# Patient Record
Sex: Male | Born: 1935 | Race: White | Hispanic: No | Smoking: Former smoker
Health system: Southern US, Community
[De-identification: ages and names within clinical notes are randomized; demographics above are authoritative.]

## PROBLEM LIST (undated history)

## (undated) DIAGNOSIS — I509 Heart failure, unspecified: Secondary | ICD-10-CM

## (undated) DIAGNOSIS — F1010 Alcohol abuse, uncomplicated: Secondary | ICD-10-CM

## (undated) DIAGNOSIS — F423 Hoarding disorder: Secondary | ICD-10-CM

## (undated) DIAGNOSIS — B029 Zoster without complications: Secondary | ICD-10-CM

## (undated) DIAGNOSIS — N400 Enlarged prostate without lower urinary tract symptoms: Secondary | ICD-10-CM

## (undated) DIAGNOSIS — I480 Paroxysmal atrial fibrillation: Secondary | ICD-10-CM

## (undated) DIAGNOSIS — F32A Depression, unspecified: Secondary | ICD-10-CM

## (undated) DIAGNOSIS — N200 Calculus of kidney: Secondary | ICD-10-CM

## (undated) DIAGNOSIS — K219 Gastro-esophageal reflux disease without esophagitis: Secondary | ICD-10-CM

## (undated) DIAGNOSIS — Z950 Presence of cardiac pacemaker: Secondary | ICD-10-CM

## (undated) DIAGNOSIS — K7460 Unspecified cirrhosis of liver: Secondary | ICD-10-CM

## (undated) DIAGNOSIS — E785 Hyperlipidemia, unspecified: Secondary | ICD-10-CM

## (undated) DIAGNOSIS — J449 Chronic obstructive pulmonary disease, unspecified: Secondary | ICD-10-CM

## (undated) DIAGNOSIS — R911 Solitary pulmonary nodule: Secondary | ICD-10-CM

## (undated) DIAGNOSIS — M069 Rheumatoid arthritis, unspecified: Secondary | ICD-10-CM

## (undated) DIAGNOSIS — K644 Residual hemorrhoidal skin tags: Secondary | ICD-10-CM

## (undated) DIAGNOSIS — K449 Diaphragmatic hernia without obstruction or gangrene: Secondary | ICD-10-CM

## (undated) DIAGNOSIS — F419 Anxiety disorder, unspecified: Secondary | ICD-10-CM

## (undated) DIAGNOSIS — I2510 Atherosclerotic heart disease of native coronary artery without angina pectoris: Secondary | ICD-10-CM

## (undated) DIAGNOSIS — K635 Polyp of colon: Secondary | ICD-10-CM

## (undated) DIAGNOSIS — I10 Essential (primary) hypertension: Secondary | ICD-10-CM

## (undated) DIAGNOSIS — I442 Atrioventricular block, complete: Secondary | ICD-10-CM

## (undated) DIAGNOSIS — K5730 Diverticulosis of large intestine without perforation or abscess without bleeding: Secondary | ICD-10-CM

## (undated) DIAGNOSIS — F329 Major depressive disorder, single episode, unspecified: Secondary | ICD-10-CM

## (undated) DIAGNOSIS — C44309 Unspecified malignant neoplasm of skin of other parts of face: Secondary | ICD-10-CM

## (undated) DIAGNOSIS — K222 Esophageal obstruction: Secondary | ICD-10-CM

## (undated) HISTORY — DX: Depression, unspecified: F32.A

## (undated) HISTORY — PX: TOTAL KNEE ARTHROPLASTY: SHX125

## (undated) HISTORY — DX: Anxiety disorder, unspecified: F41.9

## (undated) HISTORY — DX: Gastro-esophageal reflux disease without esophagitis: K21.9

## (undated) HISTORY — DX: Unspecified malignant neoplasm of skin of other parts of face: C44.309

## (undated) HISTORY — DX: Solitary pulmonary nodule: R91.1

## (undated) HISTORY — DX: Chronic obstructive pulmonary disease, unspecified: J44.9

## (undated) HISTORY — DX: Hyperlipidemia, unspecified: E78.5

## (undated) HISTORY — PX: INGUINAL HERNIA REPAIR: SUR1180

## (undated) HISTORY — PX: TOTAL HIP ARTHROPLASTY: SHX124

## (undated) HISTORY — DX: Diaphragmatic hernia without obstruction or gangrene: K44.9

## (undated) HISTORY — DX: Paroxysmal atrial fibrillation: I48.0

## (undated) HISTORY — DX: Calculus of kidney: N20.0

## (undated) HISTORY — PX: SPLENECTOMY: SUR1306

## (undated) HISTORY — DX: Zoster without complications: B02.9

## (undated) HISTORY — DX: Benign prostatic hyperplasia without lower urinary tract symptoms: N40.0

## (undated) HISTORY — DX: Polyp of colon: K63.5

## (undated) HISTORY — PX: APPENDECTOMY: SHX54

## (undated) HISTORY — DX: Alcohol abuse, uncomplicated: F10.10

## (undated) HISTORY — DX: Atrioventricular block, complete: I44.2

## (undated) HISTORY — DX: Atherosclerotic heart disease of native coronary artery without angina pectoris: I25.10

## (undated) HISTORY — DX: Esophageal obstruction: K22.2

## (undated) HISTORY — PX: SKIN CANCER EXCISION: SHX779

## (undated) HISTORY — DX: Unspecified cirrhosis of liver: K74.60

## (undated) HISTORY — DX: Diverticulosis of large intestine without perforation or abscess without bleeding: K57.30

## (undated) HISTORY — DX: Major depressive disorder, single episode, unspecified: F32.9

## (undated) HISTORY — DX: Residual hemorrhoidal skin tags: K64.4

## (undated) HISTORY — DX: Essential (primary) hypertension: I10

## (undated) HISTORY — DX: Rheumatoid arthritis, unspecified: M06.9

---

## 2000-01-10 ENCOUNTER — Encounter: Payer: Self-pay | Admitting: Emergency Medicine

## 2000-01-10 ENCOUNTER — Observation Stay (HOSPITAL_COMMUNITY): Admission: EM | Admit: 2000-01-10 | Discharge: 2000-01-11 | Payer: Self-pay

## 2000-01-10 ENCOUNTER — Encounter: Payer: Self-pay | Admitting: Specialist

## 2000-05-30 ENCOUNTER — Encounter (INDEPENDENT_AMBULATORY_CARE_PROVIDER_SITE_OTHER): Payer: Self-pay | Admitting: Gastroenterology

## 2001-01-05 ENCOUNTER — Ambulatory Visit (HOSPITAL_COMMUNITY): Admission: RE | Admit: 2001-01-05 | Discharge: 2001-01-05 | Payer: Self-pay | Admitting: Internal Medicine

## 2002-04-20 ENCOUNTER — Encounter: Admission: RE | Admit: 2002-04-20 | Discharge: 2002-04-20 | Payer: Self-pay | Admitting: Internal Medicine

## 2002-04-20 ENCOUNTER — Encounter: Payer: Self-pay | Admitting: Internal Medicine

## 2004-12-19 ENCOUNTER — Ambulatory Visit (HOSPITAL_COMMUNITY): Admission: RE | Admit: 2004-12-19 | Discharge: 2004-12-19 | Payer: Self-pay | Admitting: Internal Medicine

## 2004-12-20 ENCOUNTER — Ambulatory Visit (HOSPITAL_COMMUNITY): Admission: RE | Admit: 2004-12-20 | Discharge: 2004-12-20 | Payer: Self-pay | Admitting: Internal Medicine

## 2004-12-26 ENCOUNTER — Encounter: Admission: RE | Admit: 2004-12-26 | Discharge: 2004-12-26 | Payer: Self-pay | Admitting: Internal Medicine

## 2006-01-27 ENCOUNTER — Ambulatory Visit: Payer: Self-pay | Admitting: Gastroenterology

## 2006-02-04 ENCOUNTER — Ambulatory Visit: Payer: Self-pay | Admitting: Gastroenterology

## 2006-02-04 ENCOUNTER — Encounter (INDEPENDENT_AMBULATORY_CARE_PROVIDER_SITE_OTHER): Payer: Self-pay | Admitting: *Deleted

## 2006-02-04 DIAGNOSIS — D126 Benign neoplasm of colon, unspecified: Secondary | ICD-10-CM

## 2006-07-08 ENCOUNTER — Encounter: Admission: RE | Admit: 2006-07-08 | Discharge: 2006-07-08 | Payer: Self-pay | Admitting: Internal Medicine

## 2006-11-02 ENCOUNTER — Emergency Department (HOSPITAL_COMMUNITY): Admission: EM | Admit: 2006-11-02 | Discharge: 2006-11-02 | Payer: Self-pay | Admitting: Emergency Medicine

## 2006-11-06 ENCOUNTER — Inpatient Hospital Stay (HOSPITAL_COMMUNITY): Admission: EM | Admit: 2006-11-06 | Discharge: 2006-11-11 | Payer: Self-pay | Admitting: Emergency Medicine

## 2006-11-07 ENCOUNTER — Encounter (INDEPENDENT_AMBULATORY_CARE_PROVIDER_SITE_OTHER): Payer: Self-pay | Admitting: *Deleted

## 2006-11-07 ENCOUNTER — Encounter: Payer: Self-pay | Admitting: Vascular Surgery

## 2007-04-03 ENCOUNTER — Ambulatory Visit (HOSPITAL_COMMUNITY): Admission: RE | Admit: 2007-04-03 | Discharge: 2007-04-03 | Payer: Self-pay | Admitting: Internal Medicine

## 2007-04-06 ENCOUNTER — Ambulatory Visit: Payer: Self-pay | Admitting: Gastroenterology

## 2007-04-06 LAB — CONVERTED CEMR LAB
ALT: 84 U/L — ABNORMAL HIGH
AST: 78 U/L — ABNORMAL HIGH
Albumin: 3.3 g/dL — ABNORMAL LOW
Alkaline Phosphatase: 63 U/L
Amylase: 125 U/L
Bilirubin, Direct: 0.1 mg/dL
Lipase: 55 U/L
Total Bilirubin: 1 mg/dL
Total Protein: 7.3 g/dL

## 2007-09-24 ENCOUNTER — Ambulatory Visit (HOSPITAL_COMMUNITY): Admission: RE | Admit: 2007-09-24 | Discharge: 2007-09-24 | Payer: Self-pay | Admitting: Cardiology

## 2007-10-05 ENCOUNTER — Inpatient Hospital Stay (HOSPITAL_COMMUNITY): Admission: EM | Admit: 2007-10-05 | Discharge: 2007-10-13 | Payer: Self-pay | Admitting: Emergency Medicine

## 2007-10-12 HISTORY — PX: CARDIAC CATHETERIZATION: SHX172

## 2007-10-22 HISTORY — PX: PACEMAKER INSERTION: SHX728

## 2008-03-04 DIAGNOSIS — K5730 Diverticulosis of large intestine without perforation or abscess without bleeding: Secondary | ICD-10-CM | POA: Insufficient documentation

## 2008-03-04 DIAGNOSIS — K219 Gastro-esophageal reflux disease without esophagitis: Secondary | ICD-10-CM | POA: Insufficient documentation

## 2008-03-04 DIAGNOSIS — K644 Residual hemorrhoidal skin tags: Secondary | ICD-10-CM | POA: Insufficient documentation

## 2008-03-04 DIAGNOSIS — M069 Rheumatoid arthritis, unspecified: Secondary | ICD-10-CM

## 2008-03-04 DIAGNOSIS — G7000 Myasthenia gravis without (acute) exacerbation: Secondary | ICD-10-CM

## 2008-03-04 DIAGNOSIS — F411 Generalized anxiety disorder: Secondary | ICD-10-CM

## 2008-03-04 DIAGNOSIS — F329 Major depressive disorder, single episode, unspecified: Secondary | ICD-10-CM

## 2008-03-04 DIAGNOSIS — J45909 Unspecified asthma, uncomplicated: Secondary | ICD-10-CM | POA: Insufficient documentation

## 2008-03-04 DIAGNOSIS — N200 Calculus of kidney: Secondary | ICD-10-CM | POA: Insufficient documentation

## 2008-09-05 ENCOUNTER — Inpatient Hospital Stay (HOSPITAL_COMMUNITY): Admission: EM | Admit: 2008-09-05 | Discharge: 2008-09-07 | Payer: Self-pay | Admitting: Emergency Medicine

## 2008-12-03 ENCOUNTER — Emergency Department (HOSPITAL_COMMUNITY): Admission: EM | Admit: 2008-12-03 | Discharge: 2008-12-03 | Payer: Self-pay | Admitting: Emergency Medicine

## 2010-06-04 ENCOUNTER — Ambulatory Visit: Payer: Self-pay | Admitting: Cardiology

## 2010-06-08 ENCOUNTER — Ambulatory Visit: Payer: Self-pay | Admitting: Cardiology

## 2010-06-15 ENCOUNTER — Ambulatory Visit: Payer: Self-pay | Admitting: Cardiology

## 2010-06-22 ENCOUNTER — Ambulatory Visit: Payer: Self-pay | Admitting: Cardiology

## 2010-06-29 ENCOUNTER — Ambulatory Visit: Payer: Self-pay | Admitting: Cardiology

## 2010-07-12 ENCOUNTER — Ambulatory Visit: Payer: Self-pay | Admitting: Cardiology

## 2010-07-18 ENCOUNTER — Ambulatory Visit: Payer: Self-pay | Admitting: Cardiology

## 2010-08-02 ENCOUNTER — Ambulatory Visit: Payer: Self-pay | Admitting: Cardiology

## 2010-08-14 ENCOUNTER — Ambulatory Visit: Payer: Self-pay | Admitting: Cardiology

## 2010-08-21 ENCOUNTER — Ambulatory Visit: Payer: Self-pay | Admitting: Cardiology

## 2010-09-04 ENCOUNTER — Ambulatory Visit: Payer: Self-pay | Admitting: Cardiology

## 2010-09-13 ENCOUNTER — Encounter: Payer: Self-pay | Admitting: Internal Medicine

## 2010-09-18 ENCOUNTER — Ambulatory Visit: Payer: Self-pay | Admitting: Cardiology

## 2010-10-02 ENCOUNTER — Ambulatory Visit: Payer: Self-pay | Admitting: Cardiology

## 2010-10-19 ENCOUNTER — Ambulatory Visit: Payer: Self-pay | Admitting: Cardiology

## 2010-11-05 ENCOUNTER — Ambulatory Visit: Payer: Self-pay | Admitting: Cardiology

## 2010-11-11 ENCOUNTER — Encounter: Payer: Self-pay | Admitting: Gastroenterology

## 2010-11-15 ENCOUNTER — Ambulatory Visit: Payer: Self-pay | Admitting: Cardiology

## 2010-11-20 NOTE — Miscellaneous (Signed)
Summary: Device preload  Clinical Lists Changes  Observations: Added new observation of PPM INDICATN: CHB (09/13/2010 13:07) Added new observation of MAGNET RTE: BOL 85 ERI 65 (09/13/2010 13:07) Added new observation of PPMLEADSTAT2: active (09/13/2010 13:07) Added new observation of PPMLEADSER2: [id number] (09/13/2010 13:07) Added new observation of PPMLEADMOD2: 5076  (09/13/2010 13:07) Added new observation of PPMLEADDOI2: 09/06/2008  (09/13/2010 13:07) Added new observation of PPMLEADLOC2: RV  (09/13/2010 13:07) Added new observation of PPMLEADSTAT1: active  (09/13/2010 13:07) Added new observation of PPMLEADSER1: [id number]  (09/13/2010 13:07) Added new observation of PPMLEADMOD1: 5076  (09/13/2010 13:07) Added new observation of PPMLEADDOI1: 09/06/2008  (09/13/2010 13:07) Added new observation of PPMLEADLOC1: RA  (09/13/2010 13:07) Added new observation of PPM DOI: 09/06/2008  (09/13/2010 13:07) Added new observation of PPM SERL#: [id number]  (09/13/2010 13:07) Added new observation of PPM MODL#: [id number]  (09/13/2010 65:78) Added new observation of PACEMAKERMFG: Medtronic  (09/13/2010 13:07) Added new observation of PPM IMP MD: Lady Deutscher, MD  (09/13/2010 13:07) Added new observation of PPM REFER MD: Leodis Sias, MD  (09/13/2010 13:07) Added new observation of PACEMAKER MD: Hillis Range, MD  (09/13/2010 13:07)      PPM Specifications Following MD:  Hillis Range, MD     Referring MD:  Leodis Sias, MD PPM Vendor:  Medtronic     PPM Model Number:  ADDRL1     PPM Serial Number:  [id number] PPM DOI:  09/06/2008     PPM Implanting MD:  Lady Deutscher, MD  Lead 1    Location: RA     DOI: 09/06/2008     Model #: [id number]     Serial #: [id number]     Status: active Lead 2    Location: RV     DOI: 09/06/2008     Model #: [id number]     Serial #: [id number]     Status: active  Magnet Response Rate:  BOL 85 ERI 65  Indications:  CHB

## 2010-12-11 ENCOUNTER — Encounter: Payer: Self-pay | Admitting: Internal Medicine

## 2010-12-13 ENCOUNTER — Encounter (INDEPENDENT_AMBULATORY_CARE_PROVIDER_SITE_OTHER): Payer: Medicare Other | Admitting: Internal Medicine

## 2010-12-13 ENCOUNTER — Encounter: Payer: Self-pay | Admitting: Internal Medicine

## 2010-12-13 DIAGNOSIS — I4891 Unspecified atrial fibrillation: Secondary | ICD-10-CM | POA: Insufficient documentation

## 2010-12-13 DIAGNOSIS — I442 Atrioventricular block, complete: Secondary | ICD-10-CM | POA: Insufficient documentation

## 2010-12-13 DIAGNOSIS — I10 Essential (primary) hypertension: Secondary | ICD-10-CM | POA: Insufficient documentation

## 2010-12-13 DIAGNOSIS — I441 Atrioventricular block, second degree: Secondary | ICD-10-CM

## 2010-12-18 NOTE — Assessment & Plan Note (Signed)
Summary: pacer ck/medtronic/gso card pt/rs per pt call/mj/kl   Visit Type:  Initial Consult Referring Provider:  Dr [place] Primary Provider:  Dr Waynard Edwards  CC:  arthritis and lower back pain .  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  He developed presyncope and was found to have initially mobitz II AV block for which his pacemaker waas implanted.  He has done well since that time.  His primary concern today is with chronic back pain.  He also has a h/o atrial fibrillation for which he is treatd with coumadin.  He remains active.  HE denies CP, orthopnea, PND, presyncope or syncope.  He has chronic dypsnea which he attributes to COPD.  He also has chronic bilateral LE edema.  Current Medications (verified): 1)  Amlodipine Besylate 5 Mg Tabs (Amlodipine Besylate) .... Take One Tablet By Mouth Daily 2)  Diovan 320 Mg Tabs (Valsartan) .... Take One Tablet By Mouth Daily 3)  Cymbalta 60 Mg Cpep (Duloxetine Hcl) .Marland Kitchen.. 1 Tablet Daily 4)  Azathioprine 50 Mg Tabs (Azathioprine) .Marland Kitchen.. 1 1/2 Tablets in The Morning and in The Evening, 1 Tablet At Noon 5)  Halcion 0.25 Mg Tabs (Triazolam) .... 2 Tablets At Bedtime 6)  Aspirin Ec 325 Mg Tbec (Aspirin) .... Take One Tablet By Mouth Daily 7)  Multivitamin .Marland Kitchen.. 1 Tablet Daily 8)  Odorless Garlic 500 Mg Tabs (Garlic) .Marland Kitchen.. 1 Daily 9)  Coumadin 10 Mg Tabs (Warfarin Sodium) .... Take As Directed By Coumadin Clinic 10)  Vitamin C  Allergies (verified): No Known Drug Allergies  Past History:  Past Medical History: Complete heart block s/p PPM (MDT) by Dr Reyes Ivan 09/06/08 Paroxysmal Atrial fibrillation HTN HL CAD s/p cath 2008 which showed obstuctive disease in 2 small diagonal branches (too small for intervention) DEPRESSION (ICD-311) ANXIETY (ICD-300.00) EXTERNAL HEMORRHOIDS (ICD-455.3) GERD (ICD-530.81) COLONIC  POLYPS, HYPERPLASTIC (ICD-211.3) DIVERTICULOSIS, COLON (ICD-562.10) COPD NEPHROLITHIASIS (ICD-592.0) MYASTHENIA GRAVIS (ICD-775.2) ARTHRITIS, RHEUMATOID (ICD-714.0)  Past Surgical History: Bilateral knee replacements Total hip replacement with revision in 1990 Spenectomy Appendectomy PPM 2009  Family History: Reviewed history from 12/12/2010 and no changes required. The family history is positive for heart disease and hypertension.   Social History: Reviewed history from 12/12/2010 and no changes required.  The patient is divorced.  He does live independently.   He has a history of tobacco, primarily cigars, and quit in 1989.  He has occasional alcohol intake.      Review of Systems       All systems are reviewed and negative except as listed in the HPI.   Vital Signs:  Patient profile:   77 year old male Height:      70 inches Weight:      186.50 pounds BMI:     26.86 Pulse rate:   40 / minute BP sitting:   130 / 63  (left arm) Cuff size:   regular  Vitals Entered By: Caralee Ates CMA (December 13, 2010 12:42 PM)  Physical Exam  General:  elderly male, NAD Head:  normocephalic and atraumatic Eyes:  PERRLA/EOM intact; conjunctiva and lids normal. Mouth:  Teeth, gums and palate normal. Oral mucosa normal. Neck:  supple Lungs:  prolonged expiratory phase, with exp wheezes Heart:  RRR, no m/r/g Abdomen:  Bowel sounds positive; abdomen soft and non-tender without masses, organomegaly, or hernias noted. No hepatosplenomegaly.  Msk:  diffuse muscle atrophy Extremities:  No clubbing or cyanosis.  2+ BLE edema with venous stasis changes Neurologic:  Alert and oriented x 3. Skin:  Intact without lesions or rashes.   PPM Specifications Following MD:  Hillis Range, MD     Referring MD:  Leodis Sias, MD PPM Vendor:  [employer]     PPM Model Number:  ADDRL1     PPM Serial Number:  [id number] PPM DOI:  09/06/2008     PPM Implanting MD:  Lady Deutscher, MD  Lead 1     Location: RA     DOI: 09/06/2008     Model #: [id number]     Serial #: [id number]     Status: active Lead 2    Location: RV     DOI: 09/06/2008     Model #: [id number]     Serial #: [id number]     Status: active  Magnet Response Rate:  BOL 85 ERI 65  Indications:  CHB  MD Comments:  see scanned report  Impression & Recommendations:  Problem # 1:  ATRIOVENTRICULAR BLOCK, 2ND DEGREE (ICD-426.13) normal pacemaker function see scanned report he has complete heart block today no changes  Problem # 2:  ATRIAL FIBRILLATION (ICD-427.31) controlled appropriately anticoagulated with coumadin I would recommend lifelong anticoagulation unless he has difficulty  Problem # 3:  ESSENTIAL HYPERTENSION, BENIGN (ICD-401.1) stable no changes today  Patient Instructions: 1)  Your physician recommends that you schedule a follow-up appointment in: 6 months pacer check 2)  Your physician recommends that you continue on your current medications as directed. Please refer to the Current Medication list given to you today.

## 2010-12-27 NOTE — Cardiovascular Report (Signed)
Summary: Office Visit   Office Visit   Imported By: Roderic Ovens 12/21/2010 12:32:09  _____________________________________________________________________  External Attachment:    Type:   Image     Comment:   External Document

## 2011-01-01 NOTE — Letter (Signed)
Summary:  [facility]   Imported By: Marylou Mccoy 12/28/2010 10:20:05  _____________________________________________________________________  External Attachment:    Type:   Image     Comment:   External Document

## 2011-01-24 ENCOUNTER — Telehealth: Payer: Self-pay | Admitting: Internal Medicine

## 2011-01-24 NOTE — Telephone Encounter (Signed)
Pt calling to see how many times dr Johney Frame is going to bill him $75 for his pacemaker check, I told him each time it's checked there is a fee, he  said he wants to know how many times is he going to charge him in a year, I asked him if he was asking how many times he would need to have it checked in a years time and he said no, i'm asking how many times i will be charged and i told him each time and again he said how many times will that be

## 2011-01-24 NOTE — Telephone Encounter (Signed)
i will have our billing department check into this and make sure he is not getting over billed

## 2011-02-04 NOTE — Telephone Encounter (Signed)
[name] [name] spoke with pt and he understands how and when he will be charged

## 2011-02-05 LAB — DIFFERENTIAL
Basophils Absolute: 0 10*3/uL (ref 0.0–0.1)
Lymphocytes Relative: 14 % (ref 12–46)
Lymphs Abs: 1.2 10*3/uL (ref 0.7–4.0)
Neutro Abs: 5.5 10*3/uL (ref 1.7–7.7)
Neutrophils Relative %: 66 % (ref 43–77)

## 2011-02-05 LAB — URINALYSIS, ROUTINE W REFLEX MICROSCOPIC
Bilirubin Urine: NEGATIVE
Hgb urine dipstick: NEGATIVE
Specific Gravity, Urine: 1.022 (ref 1.005–1.030)
Urobilinogen, UA: 1 mg/dL (ref 0.0–1.0)

## 2011-02-05 LAB — CBC
HCT: 41.2 % (ref 39.0–52.0)
Hemoglobin: 14.3 g/dL (ref 13.0–17.0)
MCHC: 34.7 g/dL (ref 30.0–36.0)
MCV: 107.6 fL — ABNORMAL HIGH (ref 78.0–100.0)
RBC: 3.83 MIL/uL — ABNORMAL LOW (ref 4.22–5.81)

## 2011-02-05 LAB — COMPREHENSIVE METABOLIC PANEL
BUN: 25 mg/dL — ABNORMAL HIGH (ref 6–23)
CO2: 22 mEq/L (ref 19–32)
Calcium: 8.7 mg/dL (ref 8.4–10.5)
Creatinine, Ser: 0.91 mg/dL (ref 0.4–1.5)
GFR calc Af Amer: >60 mL/min (ref >60)
GFR calc non Af Amer: >60 mL/min (ref >60)
Glucose, Bld: 136 mg/dL — ABNORMAL HIGH (ref 70–99)

## 2011-02-06 ENCOUNTER — Encounter: Payer: Self-pay | Admitting: Internal Medicine

## 2011-02-07 ENCOUNTER — Encounter: Payer: Self-pay | Admitting: Internal Medicine

## 2011-02-07 ENCOUNTER — Ambulatory Visit (INDEPENDENT_AMBULATORY_CARE_PROVIDER_SITE_OTHER): Payer: Medicare Other | Admitting: Internal Medicine

## 2011-02-07 ENCOUNTER — Other Ambulatory Visit: Payer: Self-pay | Admitting: Internal Medicine

## 2011-02-07 DIAGNOSIS — I10 Essential (primary) hypertension: Secondary | ICD-10-CM

## 2011-02-07 DIAGNOSIS — I441 Atrioventricular block, second degree: Secondary | ICD-10-CM

## 2011-02-07 DIAGNOSIS — I4891 Unspecified atrial fibrillation: Secondary | ICD-10-CM

## 2011-02-07 NOTE — Assessment & Plan Note (Signed)
Normal pacemaker function See Pace Art report No changes today  

## 2011-02-07 NOTE — Patient Instructions (Signed)
Your physician wants you to follow-up in: 6 months in the device clinic You will receive a reminder letter in the mail two months in advance. If you don't receive a letter, please call our office to schedule the follow-up appointment.  

## 2011-02-07 NOTE — Assessment & Plan Note (Signed)
Stable Continue coumadin longterm 

## 2011-02-07 NOTE — Assessment & Plan Note (Signed)
Stable No changes 

## 2011-02-07 NOTE — Progress Notes (Signed)
The patient presents today for routine electrophysiology followup.  Since last being seen in our clinic, he reports doing reasonably well. His primary concern is with chronic lower back pain which he feels has worsened recently.  He continues to follow with Dr Waynard Edwards for this.  Today, he denies symptoms of palpitations, chest pain, shortness of breath, orthopnea, PND, lower extremity edema, dizziness, presyncope, syncope, or neurologic sequela.  The patient feels that he is tolerating medications without difficulties and is otherwise without complaint today.   Past Medical History  Diagnosis Date  . Complete heart block     s/p PPM  . PAF (paroxysmal atrial fibrillation)   . HTN (hypertension)   . HLD (hyperlipidemia)   . CAD (coronary artery disease)   . Depression   . Anxiety   . Hemorrhoids, external   . GERD (gastroesophageal reflux disease)   . Hyperplastic colonic polyp   . Diverticulosis of colon   . COPD (chronic obstructive pulmonary disease)   . Nephrolithiasis   . Myasthenia gravis   . Arthritis, rheumatoid    Past Surgical History  Procedure Date  . Total knee arthroplasty     bilateral  . Total hip arthroplasty   . Spleenectomy   . Appendectomy   . Pacemaker insertion 2009    medtronic    Current Outpatient Prescriptions  Medication Sig Dispense Refill  . amLODipine (NORVASC) 5 MG tablet Take 5 mg by mouth daily.        . Ascorbic Acid (VITAMIN C) 500 MG tablet Take 500 mg by mouth daily.        Marland Kitchen aspirin 81 MG EC tablet Take 81 mg by mouth daily.        Marland Kitchen azaTHIOprine (IMURAN) 50 MG tablet 1 1/2 tabs in the AM and at night and 1 tab at noon       . DULoxetine (CYMBALTA) 60 MG capsule Take 60 mg by mouth daily.        . Garlic 500 MG CAPS Take by mouth daily.        . Multiple Vitamin (MULTIVITAMIN) tablet Take 1 tablet by mouth daily.        . triazolam (HALCION) 0.25 MG tablet Take 0.5 mg by mouth at bedtime.        . valsartan (DIOVAN) 320 MG tablet Take 320  mg by mouth daily.        Marland Kitchen warfarin (COUMADIN) 10 MG tablet Take 10 mg by mouth as directed.        Marland Kitchen DISCONTD: aspirin 325 MG tablet Take 81 mg by mouth.         No Known Allergies  History   Social History  . Marital Status: Widowed    Spouse Name: N/A    Number of Children: N/A  . Years of Education: N/A   Occupational History  . Not on file.   Social History Main Topics  . Smoking status: Former Smoker    Types: Cigars    Quit date: 10/22/1987  . Smokeless tobacco: Not on file  . Alcohol Use: Yes     occasional  . Drug Use: Not on file  . Sexually Active: Not on file   Other Topics Concern  . Not on file   Social History Narrative  . No narrative on file    Family History  Problem Relation Age of Onset  . Hypertension    . Heart disease     Physical Exam: Filed Vitals:  02/07/11 1628  BP: 127/70  Pulse: 63  Height: 5\' 10"  (1.778 m)  Weight: 179 lb (81.194 kg)    GEN- elderly, NAD, alert and oriented x 3 today.   Head- normocephalic, atraumatic Eyes-  Sclera clear, conjunctiva pink Ears- hearing intact Oropharynx- clear Neck- supple, no JVP Lymph- no cervical lymphadenopathy Lungs- Clear to ausculation bilaterally, normal work of breathing Chest- pacemaker pocket is well healed Heart- Regular rate and rhythm, no murmurs, rubs or gallops, PMI not laterally displaced GI- soft, NT, ND, + BS Extremities- no clubbing, cyanosis, or edema MS- no significant deformity or atrophy Skin- no rash or lesion Psych- euthymic mood, full affect Neuro- strength and sensation are intact  Assessment and Plan:

## 2011-02-12 ENCOUNTER — Telehealth: Payer: Self-pay | Admitting: Cardiology

## 2011-02-12 ENCOUNTER — Other Ambulatory Visit: Payer: Self-pay | Admitting: Cardiology

## 2011-02-12 DIAGNOSIS — I4891 Unspecified atrial fibrillation: Secondary | ICD-10-CM

## 2011-02-12 NOTE — Telephone Encounter (Signed)
Lm w/ him that he needs to come in for INR check. Also put on prescription.

## 2011-02-12 NOTE — Telephone Encounter (Signed)
Dr. Waynard Edwards is doing all of his coumadin test now.

## 2011-03-04 ENCOUNTER — Other Ambulatory Visit: Payer: Self-pay | Admitting: Cardiology

## 2011-03-04 NOTE — Telephone Encounter (Signed)
Received fax for refill on Coumadin. We are no longer checking his Coumadin so needs to be refilled by Dr. Waynard Edwards.

## 2011-03-05 NOTE — H&P (Signed)
NAMEBRAYTEN, [name] [name]                 ACCOUNT NO.:  [id number]   MEDICAL RECORD NO.:  [id number]          PATIENT TYPE:  INP   LOCATION:  2039                         FACILITY:  [facility]   PHYSICIAN:  Elmore Guise., M.D.DATE OF BIRTH:  September 18, 1929   DATE OF ADMISSION:  09/05/2008  DATE OF DISCHARGE:                              HISTORY & PHYSICAL   INDICATIONS FOR ADMISSION:  Presyncope and complete heart block.   PRIMARY CARE PHYSICIAN:  Mark A. Perini, M.D.   HISTORY OF THE PRESENT ILLNESS:  The patient is a very pleasant 77-year-  old white male with a past medical history of rheumatoid arthritis,  myasthenia gravis, COPD, hypertension, and branch vessel coronary  disease who presents with 7-day history of fatigue, increasing shortness  of breath and presyncope.  The patient actually states for the last 3  months he has been feeling a little under the weather.  Initially he  attributed this to his COPD.  He went and changed his inhaler, and he  started to feel better; however, over the last 7-10 days he has been  getting more short of breath.  He has also noticed some lower extremity  edema and he has had episodes of presyncope.  He went on to see Dr.  Waynard Edwards today and there he was found to be and third-degree A-V block  with a junctional escape rhythm in the low 40s.  He was then sent to the  emergency room for further evaluation.   On arrival here the patient's blood pressure was stable with a blood  pressure 140-160 over 60-70, heart rate did show second-degree type 2 as  well as at times third-degree A-V block.  The patient denies any chest  pain or shortness of breath at this time.  He does have chronic right-  sided chest pain and a rib that tends to bother him that he thinks is  secondary to his rheumatoid arthritis.  He also has chronic shortness of  breath for which he uses inhalers.  He has chronic arthritic pain and  has undergone left and right knee replacements as  well as a left total  hip replacement.  He denies recent cough, fever, nausea, vomiting,  diarrhea, and chills.   REVIEW OF SYSTEMS:  The review of systems as is stated above.  All  others are negative.   MEDICATIONS:  The patient's current medications include:  1. Symbicort 160/4.5, two puffs twice daily.  2. Azathioprine 50 mg 1-1/2 tablets three times daily.  3. Aspirin 81 mg daily.  4. Diovan 320 mg daily.  5. Hydrochlorothiazide 25 mg daily.  6. Cymbalta 60 mg daily.  7. Nexium 40 mg daily.  8. Albuterol inhaler and nebulizer as needed.  9. The patient also takes Tylenol #3 on a p.r.n. basis.  10.Vitamin C, flaxseed oil, Megaman vitamins and garlic supplements.  11.MiraLax as needed.  12.Colace as needed.  13.The patient also uses nitroglycerin on a as needed basis.   ALLERGIES:  The patient has allergies to BENAZEPRIL, FOSAMAX, REMICADE  AND OXYCODONE.   FAMILY HISTORY:  The  family history is positive for heart disease and  hypertension.   SOCIAL HISTORY:  The patient is divorced.  He does live independently.  He has a history of tobacco, primarily cigars, and quit in 1989.  He has  occasional alcohol intake.   PHYSICAL EXAMINATION:  VITAL SIGNS:  The patient is afebrile.  Blood  pressure 160/70, heart rate is in the 40s with second-degree type 2 A-V  block noted on the monitor.  He does have a junctional escape.  He  satting 95% on room air.  HEENT:  The patient has had a skin graft to his left ear, which has  healed nicely.  Sclerae are anicteric.  NECK:  The neck is supple.  No lymphadenopathy.  Two plus carotids.  No  JVD.  No bruits.  Thyroid appears normal with no thyromegaly noted.  LUNGS:  The lungs have coarse breath sounds with occasional wheeze  noted.  HEART:  The heart is regular with normal S1-S2.  There is a 2/6  holosystolic murmur.  He is bradycardic.  ABDOMEN:  The abdomen is soft, nontender and nondistended.  He has a  history of splenomegaly with  scar noted.  EXTREMITIES:  The extremities are arm with 1+ pretibial edema and 1+  pulses are noted.  NEUROLOGIC:  On neurologic exam cranial nerves are intact and strength  is 5/5 and equal in his upper and lower extremities.  SKIN:  The skin has occasional bruises and ecchymosis, otherwise appear  normal.   LABORATORY DATA:  The patient's blood work shows a CPK of 114, MB of  2.8.  Troponin I of 0.03.  Magnesium of 2.0.  BUN and creatinine of 17  and 0.85 respectively, potassium level of 5.7, and sodium of 135.  AST  of 76 and ALT of 55.  His white count is 7.4, hemoglobin of 14.1 and  platelet count of 300,099.  Coags show a PT/INR of 15.4 and 1.2  respectively, and a PTT of 33.  ECG shows second-degree type 2 A-V block  with episodes of third-degree A-V block with a junctional escape of 41-  43 beats/minute.  Chest x-ray is consistent with COPD, and no  infiltrates or volume overload noted.   IMPRESSION:  1. Symptomatic second-degree type 2 and intermittent third-degree A-V      block.  2. History of branch vessel coronary artery disease.  3. History of chronic obstructive pulmonary disease.  4. History of rheumatoid arthritis.   PLAN:  1. At this time the patient will be admitted to telemetry monitoring.  2. We will avoid all A-V nodal agents.  3. We will add a TSH to his blood drawn in the lab; however, the      patient is symptomatic with fatigue, increasing exertional dyspnea      as well as presyncope.  4. We will schedule him for a pacemaker (dual-chamber) in the morning.      I have discussed the risk and benefits of this procedure with him      at length and he would like to proceed.  5. The patient will have gentle hydration done tonight and a repeat      BMP in the morning to check his potassium levels.  6. I will continue his inhalers as needed.  7. We will also continue his Diovan and hydrochlorothiazide for now.  8. All his questions were answered and we  discussed his plan of care      at length.  Elmore Guise., M.D.  Electronically Signed     TWK/MEDQ  D:  09/05/2008  T:  09/06/2008  Job:  [id number]   cc:   Loraine Leriche A. Perini, M.D.

## 2011-03-05 NOTE — H&P (Signed)
[name] [name], [name] [name]                 ACCOUNT NO.:  [id number]   MEDICAL RECORD NO.:  [id number]          PATIENT TYPE:  INP   LOCATION:  4703                         FACILITY:  [facility]   PHYSICIAN:  Mark A. Perini, M.D.   DATE OF BIRTH:  Aug 11, 1929   DATE OF ADMISSION:  10/05/2007  DATE OF DISCHARGE:                              HISTORY & PHYSICAL   CHIEF COMPLAINT:  Fever, productive cough, constipation.   HISTORY OF PRESENT ILLNESS:  [name] [name] is a 77 year old gentleman with a  past history significant for rheumatoid arthritis, myasthenia gravis,  multiple joint replacements, asthma, benign prostatic hypertrophy,  splenectomy, acid reflux, and hypertension.  He was also recently found  by a coronary CT scan just in the last week to probably have significant  underlying coronary disease.  He presents to the office, walking into  our office with several days of temperature, sore throat, and left ear  pain.  He has had very little to eat or drink in the last 24 hours.  He  says he is due for a heart cath but is not sure if he will be able to do  this.  He has an area of his left hip that has been bothering him as  well.   On Friday, three days prior to admission, he fell over a toilet and hit  his back.  He has a very painful area of his left rib cage area  posteriorly, and he has had some shortness of breath and stabbing pain  in this area.  He has also had a productive cough of yellow mucus for  the last 2-3 days.  In the office, he had decreased breath sounds  bilaterally but no significant wheezes, rales, or rhonchi.  He did have  productive cough and a temperature of 100.6.  Given all of his problems,  it is felt it would be best to admit him for further care.   PAST MEDICAL HISTORY:  1. Rheumatoid arthritis diagnosed in early 1980s.  2. Myasthenia gravis diagnosed in the late 1980s.  3. Left and right total knee replacements in the 1980s.  4. Left total hip replacement in 1975,  which was revised in 1990 and      again revised in December, 2000 at [facility].  5. Hiatal hernia.  6. Nephrolithiasis.  7. Colon polyps.  8. Asthma.  9. Benign prostatic hypertrophy.  10.Chronic right shoulder pain.  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  12.History of appendectomy.  13.Right hernia repair.  14.Skin cancer on his forehead and a recent skin cancer of his left      ear.  15.Chronic lower extremity edema.  16.Impaired fasting glucose.  17.Peptic ulcer disease or gastritis in 2005.  18.A history of acid reflux.  19.Sigmoid diverticulosis.  20.Essential tremor.  21.Lumbar spine spinal stenosis diagnosed by MRI in March, 2006.  22.Fatty infiltration of the liver noted on ultrasound in 2005.  23.Atherosclerotic coronary artery disease diagnosed by coronary CT      scan in December, 2008.   ALLERGIES:  FOSAMAX was a big failure, in his words.  REMICADE made  him worse.  BENAZEPRIL caused him to have flank pain.  OXYCODONE made  him dizzy, and he passed out.   CURRENT MEDICATIONS:  1. Azathioprine 75 mg 3 times a day.  2. Advair 100/50 1 puff twice daily.  3. Triazolam 0.25 mg 1-2 tablets each evening.  4. Tylenol #3 1-2 tablets every 4-6 hours as needed for pain.  5. Vitamin C daily.  6. Flaxseed daily.  7. Mega men multivitamin daily.  8. Garlic daily.  9. Psyllium 500 mg 3 times day.  10.Aspirin 99 mg daily.  11.Diovan 320 mg daily.  12.Cymbalta  60 mg daily.  13.Nexium 40 mg daily.  14.Albuterol and Atrovent nebs as needed.  15.Phenergan as needed.  16.Hydrochlorothiazide 25 mg daily.  17.Nystatin powder as needed.  18.Align 1 daily.   SOCIAL HISTORY:  He is divorced.  He has five total children.  His son  is with him.  He lives with his son, who has some psychological issues.  He quit smoking cigars in 1989.  He has rare alcohol and no drug use  history.   REVIEW OF SYSTEMS:  As per the history of present illness.  He denies  any blood from  above or below.   PHYSICAL EXAMINATION:  Temperature 100.6 orally, 94% saturation on room  air.  Blood pressure 110/62.  Pulse 100.  He is in no acute distress but appears to be in pain.  His respirations  are somewhat shallow and splinted due to pain in his back.  He has  decreased breath sounds bilaterally but no wheezes, rales or rhonchi.  HEART:  Regular rate and rhythm with no murmurs, rubs or gallops.  There  is 1+ bilateral foot and ankle nonpitting edema.  ABDOMEN:  Soft and nontender with normoactive bowel sounds.   LABORATORY DATA:  Pending at the time of this dictation.   ASSESSMENT/PLAN:  1. Atherosclerotic coronary artery disease by recent CT angiogram.  He      is not having any chest pain currently.  His heart cath may need to      be delayed until his current issues are improved.  2. Fall with probable broken ribs on the left posterior thorax.  We      will check a chest x-ray as well as dedicated rib views.  3. Possible pneumonia with fever and productive cough.  We will admit      and place on IV Rocephin and check a chest x-ray.  We will continue      Advair and will give him Xopenex nebs as needed.  He has      constipation with no      bowel movement in the last 3-1/2 days.  We will check an acute      abdominal series and place him on a laxative regimen and give him a      soapsuds enema.   He is a full code status.  We will attempt to continue his other  medicines to the best of our ability.      Mark A. Perini, M.D.  Electronically Signed     MAP/MEDQ  D:  10/05/2007  T:  10/05/2007  Job:  [id number]   cc:   Elmore Guise., M.D.  Aundra Dubin, M.D.  Ria Bush Jorja Loa, M.D.

## 2011-03-05 NOTE — Assessment & Plan Note (Signed)
[facility]                         GASTROENTEROLOGY OFFICE NOTE   [name] [name], [name] [name]                        MRN:          [id number]  DATE:04/06/2007                            DOB:          [birth year]    Mr. Trieu is a 77 year old white male, previously followed by Dr.  Victorino Dike, who returns on referral from Dr.  Waynard Edwards for a history of  diarrhea, abnormal liver function tests and an elevated lipase.   PROBLEM LIST:  1. Rheumatoid arthritis.  2. Myasthenia gravis.  3. Essential tremor.  4. Status post bilateral total knee replacements.  5. Status post left total hip replacement with revision in 1990.  6. Nephrolithiasis.  7. Asthma.  8. Benign prostatic hypertrophy.  9. Status post splenectomy.  10.Diverticulosis.  11.Colon polyps.  12.Hemorrhoids.  13.Gastroesophageal reflux disease.  14.Anxiety.  15.Depression.  16.Status post appendectomy.   HISTORY OF PRESENT ILLNESS:  Mr. Huish had a tooth extracted in early  May and he was prescribed a course of amoxicillin. Following this, he  had raw sensation in his mouth and he began having explosive watery  diarrhea with nausea. He was treated by Dr.  Waynard Edwards for possible oral  candidiasis and his oral symptoms have improved. He was also treated  with two courses of metronidazole for suspected C-difficile diarrhea and  he is currently completing the second course of metronidazole and his  diarrhea and nausea have abated for the past 24-48 hours. He had crampy  generalized abdominal pain associated with his diarrhea intermittently.  Blood work from March 31, 2007, revealed a slightly elevated amylase at  129 with the upper limits of normal at 125 and an elevated lipase at 191  with the upper limits of normal at 60. A CBC and liver function panel  from the same date were remarkable for an elevated MCV at 103.5,  elevated AST at 115, elevated ALT at 117 and a low albumin at 3.1.  Abdominal  ultrasound was performed on June 13th, that showed no  significant abnormalities and an abnormal area of echogenicity near the  gallbladder fossa noted on a prior study had resolved. Prior ultrasounds  suggested fatty infiltration of the liver but there was no convincing  abnormal echogenicity on his most recent examination.   CURRENT MEDICATIONS:  Listed on the chart, updated and reviewed.   MEDICATION ALLERGIES:  None known.   PHYSICAL EXAMINATION:  In no acute distress. Weight 195.6 pounds, blood  pressure 102/62, pulse 76 and regular.  CHEST: Slightly decreased breath sounds bilaterally.  CARDIAC: Regular rate and rhythm without murmurs.  ABDOMEN: Soft and nontender. Nondistended. Normoactive bowel sounds. No  palpable organomegaly, masses or hernias.   ASSESSMENT/PLAN:  1. Resolving diarrhea associated with nausea. I suspect he had an      infectious diarrhea most likely C-difficile. He is recommended to      begin a course of Align one daily for the next 4 to 6 weeks. If his      symptoms relapse, will obtain stool studies, retreat with      antibiotics and consider  repeat colonoscopy.  2. Elevated transaminases. Etiology unclear. Plan to repeat his liver      function test today. Consider microlithiasis and consider a CCK      stimulated hepatobiliary scan if his abdominal pain and LFT      abnormalities persist.  3. Elevated lipase without clear symptoms of pancreatitis. The      pancreas was not imaged on the recent ultrasound examination.      Repeat amylase and lipase today. If they remain abnormal or his      abdominal pain recurs, will plan to proceed with a CT scan of the      abdomen and pelvis for further evaluation. Return office visit in      approximately 6 weeks.     Venita Lick. Russella Dar, MD, [facility]  Electronically Signed    MTS/MedQ  DD: 04/06/2007  DT: 04/06/2007  Job #: [id number]   cc:   Loraine Leriche A. Perini, M.D.

## 2011-03-05 NOTE — Discharge Summary (Signed)
[name] [name], [name] [name]                 ACCOUNT NO.:  [id number]   MEDICAL RECORD NO.:  [id number]          PATIENT TYPE:  INP   LOCATION:  2039                         FACILITY:  [facility]   PHYSICIAN:  Elmore Guise., M.D.DATE OF BIRTH:  12/02/1928   DATE OF ADMISSION:  09/05/2008  DATE OF DISCHARGE:  09/07/2008                               DISCHARGE SUMMARY   DISCHARGE DIAGNOSES:  1. High-grade second degree and third degree AV block.  2. Status post dual chamber permanent pacemaker implant.  3. History of rheumatoid arthritis.  4. Hypertension.  5. Dyslipidemia.  6. History of branch vessel coronary artery disease.   HISTORY OF PRESENT ILLNESS:  Mr. Binsfeld is a very pleasant 77 year old  white male with multiple medical problems who presented with a 7 day  history of presyncope, increasing exertional dyspnea and fatigue.  He  was found to be in high-grade second degree and third degree heart  block.  He was admitted and underwent pacemaker implant.   HOSPITAL COURSE:  The patient's hospital course was uncomplicated.  He  underwent dual chamber permanent pacemaker implant on September 06, 2008.  His post pacemaker course was uncomplicated.  His postprocedure chest x-  ray showed normal position of his atrial and ventricular leads.  His  pacemaker site showed no significant swelling.  He was A sensed V paced  80% at time.  His pain seemed to be well-controlled.  His blood pressure  was stable and he had no evidence of pneumothorax on chest x-ray.  His  pacer was interrogated and functioned appropriately with normal  impedance, threshold, sensing in both his atrial and ventricular  chambers.  He will be discharged home today to continue the following  medications:   DISCHARGE MEDICATIONS:  1. Symbicort 2 puffs twice daily.  2. Azathioprine 50 mg 1-1/2 tablets three times daily.  3. Aspirin 81 mg daily.  4. Diovan 320 mg daily.  5. HCTZ 25 mg daily.  6. Cymbalta 60 mg daily.  7.  Nexium 40 mg daily.  8. Albuterol nebs as needed.  9. Tylenol No. 3 as needed.  10.Simvastatin 40 mg daily.  11.Colace as needed for pain.   DISCHARGE INSTRUCTIONS:  He will have post pacemaker restrictions  including no heavy lifting with his left arm for the next 2-3 weeks.  He  is also not to lift his left arm above shoulder level for the same time  period.  He is to keep his pacemaker site clean and dry for the next  week.  He should Betadine his Steri-Strips once daily for the next 3  days.  Betadine swabs will be given to the patient prior to discharge.  The patient is to call the office if he has any  drainage from his site or any significant swelling or bleeding.  He will  follow up with Dr. Lady Deutscher at [facility] in 1 week.  He  will keep his regular office visit with Dr. Waynard Edwards as scheduled.  All  his questions were answered prior to discharge.      Rosine Abe  Montez Hageman., M.D.  Electronically Signed     TWK/MEDQ  D:  09/07/2008  T:  09/07/2008  Job:  [id number]   cc:   Loraine Leriche A. Perini, M.D.

## 2011-03-05 NOTE — Cardiovascular Report (Signed)
NAMETERRYN, [name] [name]                 ACCOUNT NO.:  [id number]   MEDICAL RECORD NO.:  [id number]          PATIENT TYPE:  INP   LOCATION:  4703                         FACILITY:  [facility]   PHYSICIAN:  Elmore Guise., M.D.DATE OF BIRTH:  1928-12-01   DATE OF PROCEDURE:  10/12/2007  DATE OF DISCHARGE:                            CARDIAC CATHETERIZATION   INDICATIONS FOR PROCEDURE:  Abnormal coronary CT angiogram, continued  chest pain.  The patient now referred for cardiac catheterization.   DESCRIPTION OF PROCEDURE:  The patient brought to the cardiac  catheterization lab.  After appropriate informed consent, he was prepped  and draped in a sterile fashion.  Approximately 10 mL of 1% lidocaine  was used for local anesthesia.  A 5-French sheath placed in the right  femoral artery without difficulty.  Coronary angiography, LV angiography  were then performed.  The patient tolerated the procedure well and was  transferred from the cardiac catheterization lab in stable condition.   FINDINGS:  1. Left Main:  Mildly calcified with mild luminal irregularities.  No      obstructive disease noted.  2. LAD:  Mild proximal calcification, moderate size, with mild luminal      irregularities.  3. D-1/D-2:  Small vessels with proximal 80-90% stenosis.  Vessel size      was less than or equal to 1.5 mm.  4. LCX:  Is codominant, with proximal luminal irregularities.  5. OM-1:  Moderate-sized vessel with mild luminal irregularities.  6. OM-2:  Moderate-to-large vessel with moderate midvessel disease, 40-      50% stenosis prior to the bifurcation, and two moderate-sized      distal vein branches, both with mild luminal irregularities.  7. RCA:  Codominant, with mild luminal irregularities.  8. LV:  EF is 60%.  No wall motion abnormalities.  LVEDP is 9 mmHg.   IMPRESSION:  1. Obstructive branch vessel disease and proximal diagonal #1/diagonal      #2, both being too small for stent or percutaneous  coronary      intervention.  2. Nonobstructive left anterior descending artery, left circumflex,      right coronary artery.  3. Preserved left ventricular systolic function, ejection fraction      60%.   PLAN:  At this time I would recommend aggressive risk factor  modification and medical therapy as indicated.      Elmore Guise., M.D.  Electronically Signed     TWK/MEDQ  D:  10/12/2007  T:  10/13/2007  Job:  [id number]   cc:   Loraine Leriche A. Perini, M.D.

## 2011-03-08 NOTE — H&P (Signed)
[name] [name]                 ACCOUNT NO.:  [id number]   MEDICAL RECORD NO.:  [id number]          PATIENT TYPE:  INP   LOCATION:  0103                         FACILITY:  [facility]   PHYSICIAN:  Mark A. Perini, M.D.   DATE OF BIRTH:  05-09-1929   DATE OF ADMISSION:  11/06/2006  DATE OF DISCHARGE:                              HISTORY & PHYSICAL   CHIEF COMPLAINTS:  Fever, chills and chest pain.   HISTORY OF PRESENT ILLNESS:  [name] [name] is a 77 year old male with history  significant for asthma, COPD, hypertension, myasthenia gravis and  rheumatoid arthritis who earlier this week had a traumatic amputation of  his left and index finger requiring procedure.  He had called our office  last week with chest cold symptoms which consisted of cough, chest  congestion and yellow productive mucus.  He was placed on oral Augmentin  and has taken almost one week of this.  He did have significant  improvement in his symptoms initially.  However, he awoke this morning  at 3:00 a.m. with shortness of breath, chest pain and restricted  breathing.  It seems the symptoms have been slowly increasing in  intensity.  He presented to the emergency room and is found to have  fever, bilateral pulmonary infiltrates on chest x-ray and elevated white  count.  He was given nitroglycerin with no relief per the nursing staff  and given morphine for his pain.  Initial cardiac enzymes are negative.  He still has some cough which is mildly productive.  He reports some  wheezing.  There is been no blood above or below.  He currently reports  his chest pain at 5/10 in severity.  He will require admission.   PAST MEDICAL HISTORY:  1. 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  2. Asthma.  3. COPD.  4. Hypertension.  5. Myasthenia  gravis.  6. Status post knee replacement and status post hip replacement and      status post splenectomy and appendectomy.  7. Rheumatoid arthritis  8. Hyperlipidemia.   ALLERGIES:   No known allergies.   MEDICATIONS:  1. Azathioprine50 mg one and a half pill twice a day with one pill      each day at noon.  2. Triazolam 0.25 mg as needed.  3  Nexium 40 mg daily.  1. Diovan 160 mg daily.  2. Augmentin 875 twice daily for 1 week ago for which he has one day      left.  3. He has been using Vicodin 1-2 tablets every 6 hours since his      finger injury.  Before that he was use Tylenol with codeine 1-2      tablets 3-4 times a day due to chronic back pain.  4. Other current medications include:      a.     Aspirin 81 mg daily.      b.     Advair 100/50 twice a day.      c.     Cymbalta 60 mg daily.  5. In the emergency room, he has been  given:      a.     Nitroglycerin.      b.     Morphine.      c.     Azithromycin.      d.     Tylenol.      e.     Aspirin.   SOCIAL HISTORY:  No tobacco, no drug, no alcohol history.  He lives with  his son.   FAMILY HISTORY:  Noncontributory.   REVIEW OF SYSTEMS:  Notable for chest pain, chills and fever as above.  He had a chest cold one week ago.  He had a his last bowel movement last  night.   PHYSICAL EXAMINATION:  VITAL SIGNS:  Temperature 101.8, blood pressure  126/66, initially was 193/103, pulse 90, respiratory 18, 98% saturation  on 2 liters of oxygen.  He is lying supine in no acute distress.  HEENT: Pupils are equally round, react to light.  Extraocular movements  are intact.  There is no icterus, no pallor.  He is normocephalic,  atraumatic.  There is no JVD.  There are decreased breath sounds  bilaterally.  There are rales at the bases and scattered rhonchi  bilaterally.  HEART:  Regular rate and rhythm with no significant murmur, rub or  gallop.  ABDOMEN:  Soft, nontender, nondistended.  No mass or positive  organomegaly.  There is 1+ nonpitting bilateral lower extremity edema.   LABORATORY DATA:  White count is 13.5 with 81% segs, 5% lymphocytes, 11%  monocytes.  Hemoglobin 13.5, platelet count  504,000, myoglobin 137, MB  5.3, troponin I less than 0.05.  Urinalysis is yellow, clear, specific  gravity 1.017, otherwise urinalysis is negative.  Sodium 136, potassium  4.5, chloride 103, CO2 24, BUN 17, creatinine 0.56, glucose 152, calcium  8.5.  GFR is estimated at greater than 60.  Chest x-ray shows  cardiomegaly with interstitial opacities in the right mid lung and left  lung base consistent with pneumonia or edema.  He also has a chronic  interstitial disease.  EKG shows normal sinus rhythm with lateral ST and  T-wave abnormality a repeat EKG shows no change.   ASSESSMENT/PLAN:  A 77 year old male with pneumonia in the setting of  immunocompromise including rheumatoid arthritis, azathioprine treatment  and splenectomy.  Will treat with IV Zosyn, nebulizers and oxygen.  He  also has substantial chest pain and nonspecific lateral EKG, ST and T-  wave abnormalities.  We will rule out for myocardial infarction with  serial enzymes and electrocardiograms.  Will ask for cardiology consult.  I have just given nitroglycerin for which he did not seem to have any  pain relief.  Therefore, I will use morphine for pain control.  We will  put him on full-dose Lovenox for now.  He is a full code status.  For  his myasthenia gravis history, this has been an unclear diagnosis, and  we will monitor his pulmonary status closely.           ______________________________  Redge Gainer Waynard Edwards, M.D.     MAP/MEDQ  D:  11/06/2006  T:  11/06/2006  Job:  [id number]

## 2011-03-08 NOTE — Discharge Summary (Signed)
[name] [name], [name] [name]                 ACCOUNT NO.:  [id number]   MEDICAL RECORD NO.:  [id number]          PATIENT TYPE:  INP   LOCATION:  4703                         FACILITY:  [facility]   PHYSICIAN:  Mark A. Perini, M.D.   DATE OF BIRTH:  [birth year]   DATE OF ADMISSION:  10/05/2007  DATE OF DISCHARGE:  10/13/2007                               DISCHARGE SUMMARY   DISCHARGE DIAGNOSES:  1. Atherosclerotic coronary artery disease with angina and obstructive      branch vessel disease of proximal diagonal 1/diagonal 2, both being      too small for stent or percutaneous intervention.  Nonobstructive      left anterior descending artery, left circumflex and right coronary      artery coronary disease.  Preserved left ventricular systolic      ejection fraction of 60% by heart catheterization done on October 12, 2007.  2. Fall with three left rib fractures.  3. Small left pleural effusion associated with #2.  4. Rheumatoid arthritis.  5. History of myasthenia gravis quiescent fortunately at this time.  6. Asthma and chronic lung disease; also possibly underlying chronic      obstructive pulmonary disease.  7. Essential tremor.  8. 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  9. Fever of unclear source.  The patient was treated with 8 days of      empiric Rocephin with resolution of his fevers.  10.Dyslipidemia.  11.Past history of colon polyps and kidney stones.   PROCEDURES:  Cardiology consultation and cardiac catheterization.  This  showed a mildly calcified left main with no obstructive disease, mild  proximal left anterior descending calcification, 80-90% stenosis of V1  and V2, but the vessel size was less than or equal to 1.5 mm and a 40-  50% OM2 stenosis and mild lumen irregularities of the RCA.   DISCHARGE MEDICATIONS:  1. Azathioprine 75 mg three times a day,  2. Advair 100/50 one puff twice a day.  3. Triazolam 0.25 mg 1-2 tablets each evening.  4. Tylenol #3 one to two  tablets every 4 hours.  5. Vitamin C.  6. Flaxseed.  7. Mega-Man vitamin.  8. Garlic supplements are permissible.  9. He is to resume his fiber supplement daily.  10.He is to resume 81 mg aspirin daily.  11.Diovan 325 mg daily.  12.Cymbalta 60 mg daily.  13.Nexium 40 mg daily.  14.HCTZ 25 mg daily.  15.Albuterol nebulized up to every 6 hours as needed.  16.Nitroglycerin 0.4 mg sublingual as needed for chest pain.  17.Two to three Colace pills a day.  18.Over-the-counter MiraLax once or twice a day as needed for      constipation.  19.Fleet's enema as needed.   HISTORY OF PRESENT ILLNESS:  [name] [name] is a 77 year old gentleman with a  past medical history of rheumatoid arthritis and myasthenia gravis who 1  week ago had a CT angiogram for chest pain symptoms which showed severe  coronary calcifications.  Then he fell and broke 3 ribs on the left  side.  He was admitted  for further management of his rib pain, and he  was also noted to have a fever.   HOSPITAL COURSE:  Orange was admitted to a telemetry bed.  He remained  stable from a cardiovascular standpoint.  He was treated with morphine  for his pain.  He was given incentive spirometry.  He was treated  empirically with Rocephin for fevers, given his immunocompromised status  due to his chronic Azathioprine therapy, as well as his history of  rheumatoid arthritis and a splenectomy.  His pain gradually improved.  However, he developed substernal chest pain episodes.  Given his history  of a recent positive CT angiogram, it was elected to perform cardiac  catheterization which was done on October 12, 2007 with the results as  noted above.  It was determined that he should be treated medically.  On  October 13, 2007, he was deemed stable for discharge home.   DISCHARGE PHYSICAL EXAMINATION:  VITAL SIGNS:  Temperature 97.3,  afebrile, pulse 64, respiratory rate 18, blood pressure 132/73, 96%  saturation on room air.  Weight 95.3  kg.  GENERAL:  He was in no acute distress.  LUNGS:  Clear to auscultation bilaterally with no wheezes, rales or  rhonchi.  HEART:  Regular rate and rhythm with no murmur, rub or gallop.  ABDOMEN:  Soft, nontender, nondistended with no mass or  hepatosplenomegaly.  There was trace lower extremity edema bilaterally.   DISCHARGE LABORATORY DATA:  White count 6.2 with 53% segs, 22%  monocytes, 16% lymphocytes, 9% eosinophils.  Hemoglobin 12.5, platelet  count 500,000.  Sodium 137, potassium 4.2, chloride 101, CO2 28, BUN 20,  creatinine 0.87, glucose 107, alkaline phosphatase 66, AST 77, ALT 63,  total protein 6.4, albumin 2.8, calcium 8.8.  Urine culture on October 09, 2007 was negative.   DISCHARGE INSTRUCTIONS:  Remiel is to follow a low-salt diet.  He is to  increase his activity slowly.  He is to call if he has any recurrent  problems.  He is to follow post cath instructions per cardiology.  He is  to follow up with Dr. Waynard Edwards in 2 weeks.  He will call for a visit.  He  is to follow up with Dr. Reyes Ivan in 2-3 weeks.  A statin has been  recommended to him in the past.  It is not clear if he tolerated this.  I will pull his office chart and try to reinitiate aggressive lipid  therapy in the very near future.      Mark A. Perini, M.D.  Electronically Signed     MAP/MEDQ  D:  10/14/2007  T:  10/14/2007  Job:  [id number]   cc:   Elmore Guise., M.D.

## 2011-03-08 NOTE — Op Note (Signed)
[name], [name] [name]                 ACCOUNT NO.:  [id number]   MEDICAL RECORD NO.:  [id number]          PATIENT TYPE:  EMS   LOCATION:  ED                           FACILITY:  [facility]   PHYSICIAN:  Vanita Panda. Magnus Ivan, M.D.DATE OF BIRTH:  08/13/1929   DATE OF PROCEDURE:  11/02/2006  DATE OF DISCHARGE:                               OPERATIVE REPORT   PREPROCEDURE DIAGNOSIS:  Left index finger amputation slash crushing  injury.   POSTPROCEDURE DIAGNOSIS:  Left index finger amputation slash crushing  injury.   PROCEDURE:  Revision amputation through middle phalanx, left index  finger.   SURGEON:  Vanita Panda. Magnus Ivan, M.D.   ANESTHESIA:  0.25% plain Sensorcaine digital block left index finger.   BLOOD LOSS:  Minimal.   COMPLICATIONS:  None.   INDICATION:  Briefly, [name] [name] is a 77 year old who was working with  some type of woodcutting equipment today when his finger got pulled in  and caught into the woodcutter and it sliced off the end of his finger  down to level of the DIP joint.  He had a contaminated wound, exposed  bone, and the fingertip was not with him.  It was recommended he undergo  revision amputation for soft tissue coverage over the remaining bone.  The risks and benefits of this were explained to him and well  understood.  He agreed and wanted to proceed with this procedure in the  emergency room.   PROCEDURE DESCRIPTION:  After we cleaned out his hand with Betadine and  alcohol I provided a digital block of 0.25% plain Sensorcaine which he  tolerated well.  When adequate anesthesia was obtained, I assessed end  of the finger and it was found to be quite contaminated.  I used several  liters of normal saline solution to clean the fingertip of debris.  I  then used a #15 blade to strip the remaining bone past the DIP joint  because of the inability to cover the wound completely due to the nature  of the soft tissue injury.  I then used bone cutting forceps  to cut the  bone just proximal to the DIP joint.  I then used a rongeur to smooth  off the edges of bone and this allowed soft tissue to be covered over  this in its entirety.  I cleaned the wound again and then used a single  5-0 Vicryl suture to cover soft tissue over the bone.  Then I used 4-0  Prolene sutures in interrupted format to rearrange the soft tissue as  full closure as well.  Hemostasis was obtained throughout the case with  electrocautery.  I then placed Xeroform followed by a well-padded  dressing over this.  The patient tolerated the procedure well and he was  released from the emergency room in stable condition with follow-up the  next 2-3 days in the office.  He is already on antibiotics for chest  cold and he will continue these antibiotics as well.          ______________________________  Vanita Panda. Magnus Ivan, M.D.    CYB/MEDQ  D:  11/02/2006  T:  11/03/2006  Job:  [id number]

## 2011-03-08 NOTE — Discharge Summary (Signed)
[name] [name], [name] [name]                 ACCOUNT NO.:  [id number]   MEDICAL RECORD NO.:  [id number]          PATIENT TYPE:  INP   LOCATION:  1439                         FACILITY:  [facility]   PHYSICIAN:  Mark A. Perini, M.D.   DATE OF BIRTH:  [birth year]   DATE OF ADMISSION:  11/06/2006  DATE OF DISCHARGE:  11/11/2006                               DISCHARGE SUMMARY   DATE OF ADMISSION:  November 06, 2006.   DISCHARGE DIAGNOSES:  1. Community-acquired pneumonia.  2. Immunocompromised due to history of rheumatoid arthritis and      myasthenia gravis and splenectomy and chronic azathioprine therapy.  3. Noncardiac chest wall pain responsive to nonsteroidal      antiinflammatories.  4. Asthma with acute exacerbation of asthma.  5. Possible chronic obstructive pulmonary disease.  6. Hypertension.  7. Hyperlipidemia.   PROCEDURES:  Cardiology consultation.   DISCHARGE MEDICATIONS:  1. Augmentin 875 mg twice daily with food for 4 further days.  2. Azathioprine 50 mg 1-1/2 tablet twice daily and 1 pill each day at      noon.  3. Triazolam 0.25 mg as needed as before.  4. Nexium 40 mg once daily.  5. Diovan 160 mg daily.  6. Vicodin 1 or 2 tablets every 6 hours as needed.  7. Aspirin 81 mg daily.  8. Advair 100/50 one puff twice daily, rinsing mouth after each use.  9. Cymbalta 60 mg once daily.  10.Culturelle 1 caplet twice daily for 2 weeks.  11.Colace 100 mg 2-3 tablets daily.  12.MiraLax laxative as needed.  13.Psyllium daily.  14.Nebulizer machine with albuterol and Atrovent nebulizers 3 to 4      times daily as needed.  15.No ibuprofen or NSAIDs at this point unless further directed from      an M.D.  16.Magic Mouthwash as needed.  17.Mucinex 600 mg 1 or 2 pills up to twice daily as needed as an      expectorant.  18.Prednisone taper   HISTORY OF PRESENT ILLNESS:  [name] [name] is a 77 year old gentleman who woke  at 3 in the morning with chest pain and restricted breathing with a slow  increase in intensity of these symptoms.  He had some cough which was  productive and some shortness of breath as well.  He has also noticed  some wheezing.  He presented to the emergency room and was found to have  infiltrates on chest x-ray and a fever to 101.   HOSPITAL COURSE:  [name] [name] as admitted to a telemetry bed.  He was ruled  out for myocardial infarction with serial enzymes and  electrocardiograms.  He had severe chest pain, however, he had no  dynamic EKG changes and it was felt that his pain was related to his  chest wall.  He responded very well to Toradol and ibuprofen for this  pain.  He was treated with Zosyn intravenously for his pneumonia and he  had no decline in his renal function.  He carries a diagnosis of  myasthenia gravis but there was never any evidence of this being active  during  this stay.  By November 11, 2006, he was deemed stable for  discharge home.   DISCHARGE PHYSICAL EXAM:  Temperature 98.0, afebrile. Pulse 66.  Respiratory rate 18.  Blood pressure 148/86 and 95% saturation on room  air.  Weight 93.1 kg.  He was in no acute distress.  LUNGS:  Clear except for some scattered wheezes on inspiration and  expiration.  HEART:  Regular rate and rhythm with no murmur, rub, or gallop.  ABDOMEN:  Soft and nontender.  There was no edema.   DISCHARGE LABORATORY DATA:  White count 6.3 with a normal differential,  hemoglobin 11.8, platelet count 478,000, sodium 139, potassium 3.9,  chloride 108, CO2 24, BUN 14, creatinine 0.78, glucose 130.  LFTs were  normal with the exception of an AST of 46 and an albumin of 2.4.  Chest  x-ray on November 10, 2006, showed mild pulmonary edema on COPD with some  small bilateral effusions.   DISCHARGE INSTRUCTIONS:  [name] [name] is to follow a low-salt, heart healthy  diet.  He is to increase his activity slowly.  He is to call if he has  any further problems.  Marland Kitchen  He should complete his prednisone taper.  He  had a finger injury  recently and he is to follow up with Dr. Allie Bossier soon.  He is to follow up with Dr. Waynard Edwards in 7-10 days and he  is to follow up with Dr. Reyes Ivan for an outpatient stress test in the  next several weeks.           ______________________________  Redge Gainer Waynard Edwards, M.D.     MAP/MEDQ  D:  11/19/2006  T:  11/19/2006  Job:  [id number]   cc:   Elmore Guise., M.D.  Fax: [phone]   Vanita Panda. Magnus Ivan, M.D.  Fax: [phone]

## 2011-03-14 ENCOUNTER — Encounter: Payer: Self-pay | Admitting: Cardiology

## 2011-03-15 ENCOUNTER — Encounter: Payer: Self-pay | Admitting: Cardiology

## 2011-03-15 ENCOUNTER — Ambulatory Visit (INDEPENDENT_AMBULATORY_CARE_PROVIDER_SITE_OTHER): Payer: Medicare Other | Admitting: Cardiology

## 2011-03-15 DIAGNOSIS — I441 Atrioventricular block, second degree: Secondary | ICD-10-CM

## 2011-03-15 DIAGNOSIS — D126 Benign neoplasm of colon, unspecified: Secondary | ICD-10-CM

## 2011-03-15 DIAGNOSIS — I2510 Atherosclerotic heart disease of native coronary artery without angina pectoris: Secondary | ICD-10-CM | POA: Insufficient documentation

## 2011-03-15 DIAGNOSIS — I4891 Unspecified atrial fibrillation: Secondary | ICD-10-CM

## 2011-03-15 NOTE — Assessment & Plan Note (Signed)
His rate is well controlled and he is on Coumadin therapy. His Coumadin is now being monitored by Dr. Laurey Morale office. I did review with her daughter today limitation of foods that are high in vitamin K and we gave her a list. We also stressed the importance of consistency in his diet. I recommended that he reduce his alcohol intake to 1 ounce per day.

## 2011-03-15 NOTE — Assessment & Plan Note (Signed)
Cardiac catheterization in 2008 showed significant branch vessel disease and 2 diagonal branches that were too small for intervention. He is really asymptomatic at this point.

## 2011-03-15 NOTE — Assessment & Plan Note (Signed)
He is status post permanent pacemaker implant in November of 2009. His pacemaker is now being followed by Dr. Johney Frame. His last pacemaker check was satisfactory.

## 2011-03-15 NOTE — Patient Instructions (Signed)
Continue your current medications.  Keep your follow up visits to check your coumadin.  I will see you back in 6 months.

## 2011-03-15 NOTE — Progress Notes (Signed)
Baxter Kail Date of Birth: 1929-09-19   History of Present Illness: Mr. Skalicky is seen today for followup. He is here with his daughter today from [place]. She states she has come here to try to take care of him. She reports that he is drinking 4 or 5 large glasses of wine daily. She is trying to fix him healthy meals and inquires about dietary restrictions with his Coumadin. Mr. Millette denies any significant chest pain. He does complain that he get short of breath quickly with exertion. This is unchanged from his last evaluation. He does complain of severe lower back pain. He has chronically poor dentition and probably needs all his teeth extracted but he is unwilling to do this because of cost. He states it would cost him $5000 to get new dentures.  Current Outpatient Prescriptions on File Prior to Visit  Medication Sig Dispense Refill  . amLODipine (NORVASC) 5 MG tablet Take 5 mg by mouth daily.        . Ascorbic Acid (VITAMIN C) 500 MG tablet Take 500 mg by mouth daily.        Marland Kitchen aspirin 81 MG EC tablet Take 81 mg by mouth daily.        . Carica Papaya (PAPAYA ENZYME) CHEW Chew 1 tablet by mouth daily.        . DULoxetine (CYMBALTA) 60 MG capsule Take 60 mg by mouth daily.        Marland Kitchen esomeprazole (NEXIUM) 40 MG capsule Take 40 mg by mouth daily before breakfast.        . Garlic 500 MG CAPS Take by mouth daily.        . Multiple Vitamin (MULTIVITAMIN) tablet Take 1 tablet by mouth daily.        . triazolam (HALCION) 0.25 MG tablet Take 0.5 mg by mouth at bedtime.        . valsartan (DIOVAN) 320 MG tablet Take 320 mg by mouth daily.        Marland Kitchen warfarin (COUMADIN) 10 MG tablet Take 10 mg by mouth as directed.        . warfarin (COUMADIN) 5 MG tablet TAKE AS DIRECTED PER DOCTOR  30 tablet  0  . DISCONTD: azaTHIOprine (IMURAN) 50 MG tablet 1 1/2 tabs in the AM and at night and 1 tab at noon         Allergies  Allergen Reactions  . Benazepril   . Fosamax   . Remicade (Infliximab)      Past Medical History  Diagnosis Date  . Complete heart block     s/p PPM  . PAF (paroxysmal atrial fibrillation)   . HTN (hypertension)   . HLD (hyperlipidemia)   . CAD (coronary artery disease)   . Depression   . Anxiety   . Hemorrhoids, external   . GERD (gastroesophageal reflux disease)   . Hyperplastic colonic polyp   . Diverticulosis of colon   . COPD (chronic obstructive pulmonary disease)   . Nephrolithiasis   . Myasthenia gravis   . Arthritis, rheumatoid     Past Surgical History  Procedure Date  . Total knee arthroplasty     bilateral  . Total hip arthroplasty     x3  . Spleenectomy   . Appendectomy   . Pacemaker insertion 2009    medtronic  . Cardiac catheterization 10/12/2007    EF 60%    History  Smoking status  . Former Smoker -- 40 years  .  Types: Cigars  . Quit date: 10/22/1987  Smokeless tobacco  . Never Used    History  Alcohol Use  . Yes    occasional    Family History  Problem Relation Age of Onset  . Hypertension    . Heart disease      Review of Systems: All other systems were reviewed and are negative.  Physical Exam: BP 118/58  Pulse 64  Ht 5' 9.5" (1.765 m)  Wt 170 lb (77.111 kg)  BMI 24.74 kg/m2 He is an elderly white male in no acute distress. His HEENT exam is remarkable for advanced periodontal disease and multiple cavities. He has no JVD or bruits. Lungs reveal scant wheezes in the right upper lung. Cardiac exam reveals an irregular rate and rhythm without gallop, murmur, or click. Abdomen is soft and nontender. He has trace lower extremity edema. Pedal pulses are palpable. LABORATORY DATA:   Assessment / Plan:

## 2011-03-21 ENCOUNTER — Other Ambulatory Visit: Payer: Self-pay | Admitting: Cardiology

## 2011-03-22 ENCOUNTER — Other Ambulatory Visit: Payer: Self-pay | Admitting: Cardiology

## 2011-03-22 NOTE — Telephone Encounter (Signed)
Denied refill. Needs to go to Dr. Waynard Edwards who is managing coumadin

## 2011-05-28 ENCOUNTER — Telehealth: Payer: Self-pay | Admitting: *Deleted

## 2011-05-28 NOTE — Telephone Encounter (Signed)
Daughter is very concerned about the amount of alcohol he consumes stating he stays up until 5:00 am drinking and then doesn't remember things the next morning.  Did speak with the patient and he insists that this pain is non cardiac and is a cracked rib.  Denies any radiating pain, but does hurt to take a deep breath.  Very short with answers stating he knew what was wrong with him.  Advised patient and daughter that he needed to call Dr Deneen Harts office and try to get an appointment.  Patient stated he would be ok and would work things out with his daugter

## 2011-05-28 NOTE — Telephone Encounter (Signed)
Patients daughter phoned and is worried about him.  Stated he has been having left sided chest pain for about 3 days and that hes saying it is a cracked rib.

## 2011-07-23 LAB — DIFFERENTIAL
Band Neutrophils: 2
Basophils Absolute: 0.1
Basophils Relative: 2 — ABNORMAL HIGH
Eosinophils Absolute: 0.4
Eosinophils Relative: 6 — ABNORMAL HIGH
Metamyelocytes Relative: 0
Monocytes Absolute: 1.9 — ABNORMAL HIGH
Monocytes Relative: 26 — ABNORMAL HIGH

## 2011-07-23 LAB — CBC
MCHC: 34.3
MCV: 104.1 — ABNORMAL HIGH
Platelets: 300
RDW: 15.8 — ABNORMAL HIGH

## 2011-07-23 LAB — COMPREHENSIVE METABOLIC PANEL
BUN: 17
CO2: 28
Calcium: 9.2
GFR calc non Af Amer: >60
Glucose, Bld: 126 — ABNORMAL HIGH
Total Protein: 6.9

## 2011-07-23 LAB — BASIC METABOLIC PANEL
CO2: 28
GFR calc Af Amer: >60
Glucose, Bld: 122 — ABNORMAL HIGH
Potassium: 4.5
Sodium: 135

## 2011-07-23 LAB — CK TOTAL AND CKMB (NOT AT ARMC)
CK, MB: 2.8
Total CK: 114

## 2011-07-23 LAB — MAGNESIUM: Magnesium: 2

## 2011-07-26 LAB — COMPREHENSIVE METABOLIC PANEL
ALT: 30
ALT: 32
ALT: 33
ALT: 39
ALT: 44
AST: 35
AST: 44 — ABNORMAL HIGH
AST: 55 — ABNORMAL HIGH
Albumin: 2.6 — ABNORMAL LOW
Albumin: 2.9 — ABNORMAL LOW
Albumin: 3.2 — ABNORMAL LOW
Alkaline Phosphatase: 66
Alkaline Phosphatase: 66
Alkaline Phosphatase: 72
Alkaline Phosphatase: 75
BUN: 19
BUN: 20
BUN: 20
BUN: 20
BUN: 35 — ABNORMAL HIGH
CO2: 27
CO2: 30
CO2: 30
CO2: 30
CO2: 31
CO2: 32
Calcium: 8.8
Calcium: 9
Calcium: 9
Calcium: 9.1
Chloride: 101
Chloride: 98
Chloride: 99
Creatinine, Ser: 0.87
Creatinine, Ser: 0.89
Creatinine, Ser: 1
Creatinine, Ser: 1.04
GFR calc Af Amer: >60
GFR calc Af Amer: >60
GFR calc Af Amer: >60
GFR calc non Af Amer: >60
GFR calc non Af Amer: >60
GFR calc non Af Amer: >60
GFR calc non Af Amer: >60
Glucose, Bld: 132 — ABNORMAL HIGH
Glucose, Bld: 133 — ABNORMAL HIGH
Glucose, Bld: 137 — ABNORMAL HIGH
Glucose, Bld: 157 — ABNORMAL HIGH
Potassium: 4.2
Potassium: 4.2
Potassium: 4.5
Potassium: 4.5
Sodium: 136
Sodium: 138
Sodium: 140
Sodium: 140
Total Bilirubin: 0.4
Total Bilirubin: 0.6
Total Bilirubin: 0.9
Total Bilirubin: 0.9
Total Protein: 6
Total Protein: 6.1
Total Protein: 6.4
Total Protein: 6.4
Total Protein: 6.6
Total Protein: 7
Total Protein: 7.1

## 2011-07-26 LAB — CBC
HCT: 35.7 — ABNORMAL LOW
HCT: 35.9 — ABNORMAL LOW
HCT: 38.4 — ABNORMAL LOW
HCT: 38.6 — ABNORMAL LOW
HCT: 39.7
Hemoglobin: 12.3 — ABNORMAL LOW
Hemoglobin: 12.5 — ABNORMAL LOW
Hemoglobin: 13
Hemoglobin: 13.6
MCHC: 33.7
MCHC: 33.7
MCHC: 34.2
MCHC: 34.2
MCHC: 35.1
MCHC: 35.1
MCV: 106.6 — ABNORMAL HIGH
MCV: 107.8 — ABNORMAL HIGH
MCV: 109.3 — ABNORMAL HIGH
Platelets: 376
Platelets: 397
Platelets: 409 — ABNORMAL HIGH
Platelets: 427 — ABNORMAL HIGH
Platelets: 476 — ABNORMAL HIGH
RBC: 3.23 — ABNORMAL LOW
RBC: 3.44 — ABNORMAL LOW
RBC: 3.49 — ABNORMAL LOW
RBC: 3.51 — ABNORMAL LOW
RBC: 3.7 — ABNORMAL LOW
RDW: 15
RDW: 15
RDW: 15.1
RDW: 15.3
RDW: 15.3
RDW: 15.3
WBC: 6.3
WBC: 6.9
WBC: 7.2

## 2011-07-26 LAB — DIFFERENTIAL
Basophils Absolute: 0
Basophils Absolute: 0
Basophils Absolute: 0.1
Basophils Relative: 0
Basophils Relative: 0
Basophils Relative: 1
Blasts: 0
Eosinophils Absolute: 0.2
Eosinophils Absolute: 0.3
Eosinophils Absolute: 0.4
Eosinophils Absolute: 0.4
Eosinophils Absolute: 0.6
Eosinophils Absolute: 0.6
Eosinophils Relative: 10 — ABNORMAL HIGH
Eosinophils Relative: 4
Eosinophils Relative: 9 — ABNORMAL HIGH
Lymphocytes Relative: 14
Lymphocytes Relative: 14
Lymphocytes Relative: 16
Lymphocytes Relative: 16
Lymphocytes Relative: 17
Lymphocytes Relative: 7 — ABNORMAL LOW
Lymphs Abs: 0.9
Lymphs Abs: 0.9
Lymphs Abs: 1.3
Metamyelocytes Relative: 0
Monocytes Absolute: 1.3 — ABNORMAL HIGH
Monocytes Absolute: 2 — ABNORMAL HIGH
Monocytes Relative: 17 — ABNORMAL HIGH
Monocytes Relative: 20 — ABNORMAL HIGH
Monocytes Relative: 22 — ABNORMAL HIGH
Monocytes Relative: 22 — ABNORMAL HIGH
Monocytes Relative: 24 — ABNORMAL HIGH
Monocytes Relative: 25 — ABNORMAL HIGH
Monocytes Relative: 28 — ABNORMAL HIGH
Myelocytes: 0
Neutro Abs: 3.1
Neutro Abs: 3.6
Neutro Abs: 6.9
Neutro Abs: 8.7 — ABNORMAL HIGH
Neutrophils Relative %: 51
Neutrophils Relative %: 52
Neutrophils Relative %: 53
Neutrophils Relative %: 70
Promyelocytes Absolute: 0
nRBC: 0
nRBC: 0
nRBC: 2 — ABNORMAL HIGH

## 2011-07-26 LAB — URINALYSIS, ROUTINE W REFLEX MICROSCOPIC
Glucose, UA: NEGATIVE
Hgb urine dipstick: NEGATIVE
Ketones, ur: 15 — AB
pH: 7.5

## 2011-07-26 LAB — CARDIAC PANEL(CRET KIN+CKTOT+MB+TROPI)
CK, MB: 2.3
Relative Index: 1.9
Troponin I: 0.03

## 2011-07-26 LAB — URINE CULTURE
Colony Count: NO GROWTH
Culture: NO GROWTH

## 2011-07-26 LAB — PHOSPHORUS: Phosphorus: 3.8

## 2011-07-26 LAB — APTT: aPTT: 35

## 2011-07-26 LAB — MAGNESIUM: Magnesium: 1.7

## 2011-07-26 LAB — HEMOGLOBIN A1C: Hgb A1c MFr Bld: 6.3 — ABNORMAL HIGH

## 2011-08-06 ENCOUNTER — Telehealth: Payer: Self-pay | Admitting: Cardiology

## 2011-08-06 NOTE — Telephone Encounter (Signed)
Called wanting to know if he could have MRI w/his pacemaker. Advised he could not. He is having a lot of knee pain. Will call his doctor and let them know.

## 2011-08-06 NOTE — Telephone Encounter (Signed)
Pt calling re ok to have an MRI with his pacemaker

## 2011-09-09 ENCOUNTER — Emergency Department (HOSPITAL_COMMUNITY): Payer: Medicare Other

## 2011-09-09 ENCOUNTER — Emergency Department (HOSPITAL_COMMUNITY)
Admission: EM | Admit: 2011-09-09 | Discharge: 2011-09-10 | Disposition: A | Discharge status: Home / Self Care | Payer: Medicare Other | Attending: Emergency Medicine | Admitting: Emergency Medicine

## 2011-09-09 ENCOUNTER — Other Ambulatory Visit: Payer: Self-pay

## 2011-09-09 ENCOUNTER — Encounter (HOSPITAL_COMMUNITY): Payer: Self-pay | Admitting: Emergency Medicine

## 2011-09-09 DIAGNOSIS — I2510 Atherosclerotic heart disease of native coronary artery without angina pectoris: Secondary | ICD-10-CM | POA: Insufficient documentation

## 2011-09-09 DIAGNOSIS — J4489 Other specified chronic obstructive pulmonary disease: Secondary | ICD-10-CM | POA: Insufficient documentation

## 2011-09-09 DIAGNOSIS — I10 Essential (primary) hypertension: Secondary | ICD-10-CM | POA: Insufficient documentation

## 2011-09-09 DIAGNOSIS — IMO0002 Reserved for concepts with insufficient information to code with codable children: Secondary | ICD-10-CM

## 2011-09-09 DIAGNOSIS — K219 Gastro-esophageal reflux disease without esophagitis: Secondary | ICD-10-CM | POA: Insufficient documentation

## 2011-09-09 DIAGNOSIS — I440 Atrioventricular block, first degree: Secondary | ICD-10-CM | POA: Insufficient documentation

## 2011-09-09 DIAGNOSIS — E785 Hyperlipidemia, unspecified: Secondary | ICD-10-CM | POA: Insufficient documentation

## 2011-09-09 DIAGNOSIS — Z950 Presence of cardiac pacemaker: Secondary | ICD-10-CM | POA: Insufficient documentation

## 2011-09-09 DIAGNOSIS — S0180XA Unspecified open wound of other part of head, initial encounter: Secondary | ICD-10-CM | POA: Insufficient documentation

## 2011-09-09 DIAGNOSIS — I447 Left bundle-branch block, unspecified: Secondary | ICD-10-CM | POA: Insufficient documentation

## 2011-09-09 DIAGNOSIS — R404 Transient alteration of awareness: Secondary | ICD-10-CM | POA: Insufficient documentation

## 2011-09-09 DIAGNOSIS — J449 Chronic obstructive pulmonary disease, unspecified: Secondary | ICD-10-CM | POA: Insufficient documentation

## 2011-09-09 DIAGNOSIS — W19XXXA Unspecified fall, initial encounter: Secondary | ICD-10-CM | POA: Insufficient documentation

## 2011-09-09 LAB — CBC
Platelets: 305 10*3/uL (ref 150–400)
RBC: 2.31 MIL/uL — ABNORMAL LOW (ref 4.22–5.81)
WBC: 7.9 10*3/uL (ref 4.0–10.5)

## 2011-09-09 LAB — POCT I-STAT, CHEM 8
BUN: 7 mg/dL (ref 6–23)
Calcium, Ion: 1.07 mmol/L — ABNORMAL LOW (ref 1.12–1.32)
Creatinine, Ser: 0.5 mg/dL (ref 0.50–1.35)
Hemoglobin: 9.5 g/dL — ABNORMAL LOW (ref 13.0–17.0)
Sodium: 136 mEq/L (ref 135–145)
TCO2: 26 mmol/L (ref 0–100)

## 2011-09-09 LAB — POCT I-STAT TROPONIN I: Troponin i, poc: 0.02 ng/mL (ref 0.00–0.08)

## 2011-09-09 LAB — URINALYSIS, ROUTINE W REFLEX MICROSCOPIC
Ketones, ur: NEGATIVE mg/dL
Leukocytes, UA: NEGATIVE
Protein, ur: NEGATIVE mg/dL
Urobilinogen, UA: 1 mg/dL (ref 0.0–1.0)

## 2011-09-09 LAB — DIFFERENTIAL
Eosinophils Relative: 2 % (ref 0–5)
Lymphocytes Relative: 13 % (ref 12–46)
Monocytes Relative: 10 % (ref 3–12)
Neutrophils Relative %: 74 % (ref 43–77)

## 2011-09-09 LAB — COMPREHENSIVE METABOLIC PANEL
Alkaline Phosphatase: 97 U/L (ref 39–117)
BUN: 8 mg/dL (ref 6–23)
CO2: 28 mEq/L (ref 19–32)
Chloride: 102 mEq/L (ref 96–112)
Creatinine, Ser: 0.44 mg/dL — ABNORMAL LOW (ref 0.50–1.35)
GFR calc non Af Amer: >90 mL/min (ref >90)
Glucose, Bld: 167 mg/dL — ABNORMAL HIGH (ref 70–99)
Potassium: 5.3 mEq/L — ABNORMAL HIGH (ref 3.5–5.1)
Total Bilirubin: 0.7 mg/dL (ref 0.3–1.2)

## 2011-09-09 LAB — PROTIME-INR: Prothrombin Time: 31.7 seconds — ABNORMAL HIGH (ref 11.6–15.2)

## 2011-09-09 MED ORDER — MORPHINE SULFATE 2 MG/ML IJ SOLN
INTRAMUSCULAR | Status: AC
  Administered 2011-09-09: 2 mg via INTRAVENOUS
  Filled 2011-09-09: qty 1

## 2011-09-09 MED ORDER — AZATHIOPRINE 50 MG PO TABS
75.0000 mg | ORAL_TABLET | Freq: Three times a day (TID) | ORAL | Status: DC
  Administered 2011-09-10 (×2): 75 mg via ORAL
  Filled 2011-09-09 (×3): qty 2

## 2011-09-09 MED ORDER — SODIUM CHLORIDE 0.9 % IV BOLUS (SEPSIS)
1000.0000 mL | Freq: Once | INTRAVENOUS | Status: AC
  Administered 2011-09-09: 1000 mL via INTRAVENOUS

## 2011-09-09 MED ORDER — TRIAZOLAM 0.25 MG PO TABS
0.2500 mg | ORAL_TABLET | Freq: Every evening | ORAL | Status: DC | PRN
  Administered 2011-09-10: 0.25 mg via ORAL

## 2011-09-09 MED ORDER — HYDROMORPHONE HCL PF 1 MG/ML IJ SOLN
1.0000 mg | Freq: Once | INTRAMUSCULAR | Status: AC
  Administered 2011-09-09: 1 mg via INTRAVENOUS
  Filled 2011-09-09: qty 1

## 2011-09-09 MED ORDER — HYDROMORPHONE HCL PF 1 MG/ML IJ SOLN
1.0000 mg | Freq: Four times a day (QID) | INTRAMUSCULAR | Status: DC | PRN
  Administered 2011-09-09 – 2011-09-10 (×3): 1 mg via INTRAVENOUS
  Filled 2011-09-09 (×3): qty 1

## 2011-09-09 MED ORDER — MORPHINE SULFATE 2 MG/ML IJ SOLN: 2.0000 mg | Freq: Once | INTRAMUSCULAR | Status: DC

## 2011-09-09 MED ORDER — MORPHINE SULFATE 2 MG/ML IJ SOLN
2.0000 mg | Freq: Once | INTRAMUSCULAR | Status: AC
  Administered 2011-09-09: 2 mg via INTRAVENOUS
  Filled 2011-09-09: qty 1

## 2011-09-09 MED ORDER — WARFARIN SODIUM 7.5 MG PO TABS: 7.5000 mg | ORAL_TABLET | Freq: Every day | ORAL | Status: DC

## 2011-09-09 MED ORDER — LOSARTAN POTASSIUM 50 MG PO TABS
100.0000 mg | ORAL_TABLET | Freq: Every day | ORAL | Status: DC
  Administered 2011-09-10: 100 mg via ORAL
  Filled 2011-09-09: qty 2

## 2011-09-09 NOTE — ED Notes (Signed)
Pt with inch long laceration above left eye, black eye and swollen scraped nose, blood cleaned off face and head, no other cuts noted.

## 2011-09-09 NOTE — ED Notes (Signed)
Paged social work to come and speak with family

## 2011-09-09 NOTE — ED Provider Notes (Signed)
Medical screening examination/treatment/procedure(s) were performed by non-physician practitioner and as supervising physician I was immediately available for consultation/collaboration.   Benny Lennert, MD 09/09/11 [phone]

## 2011-09-09 NOTE — ED Notes (Signed)
Patient states he fell at home. Does not recall falling. Has a history of falling

## 2011-09-09 NOTE — ED Notes (Signed)
REPORT GIVEN TO CDU NURSE. NO PAIN OR DISCOMFORT AT THIS TIME , RESPIRATIONS UNLABORED, IV SITE UNREMARKABLE. PLAN TO DISCHARGE PT. IN AM WHEN FAMILY IS AVAILABLE PER B. NGUYEN PA.

## 2011-09-09 NOTE — ED Notes (Signed)
Pt got up around 0200 and took pain medication and then went back to bed.  Daughter came over to check on him and found him in bed covered in blood.  Bleeding is controlled, pt on blood thinners. C/o right knee pain

## 2011-09-09 NOTE — ED Notes (Signed)
ASSUMED CARE ON PT. INTRODUCED SELF AND FAMILY , CALL LIGHT WITHIN REACH , BEDRAILS UP , WARM BLANKETS PROVIDED , DENIES PAIN OR DISCOMFORT, RESPIRATIONS UNLABORED, IV SITE UNREMARKABLE.

## 2011-09-09 NOTE — ED Notes (Signed)
B. NGUYEN PA AT BEDSIDE SUTURING PT'S LACERATION AT LEFT FOREHEAD.

## 2011-09-09 NOTE — ED Provider Notes (Signed)
History     CSN: [id number] Arrival date & time: 09/09/2011  4:24 PM   First MD Initiated Contact with Patient 09/09/11 1629    4:50 PM HPI Patient is a 77 y.o. male presenting with fall. The history is provided by the patient (Daughter).  Fall The accident occurred 1 to 2 hours ago. The fall occurred in unknown circumstances. He landed on carpet. The volume of blood lost was moderate. The pain is moderate. He was not ambulatory at the scene. There was no entrapment after the fall. There was no drug use involved in the accident. There was no alcohol use involved in the accident. Associated symptoms include loss of consciousness. Pertinent negatives include no fever, no numbness and no headaches. Treatment on scene includes a backboard and a c-collar.   patient stays at home with his daughter. Has a history of dementia. Does not recall fall. States that she went to check on his which she does several times a day. States she found in the day in bed with a significant amount of blood everywhere in his bedroom. Patient has a history of Coumadin usage. Has not had regular checks of INR. Patient reports lacerations for head is nontender. Reports chronic knee and hip pain with no changes in it. Also complaining of left hand pain.  Past Medical History  Diagnosis Date  . Complete heart block     s/p PPM  . PAF (paroxysmal atrial fibrillation)   . HTN (hypertension)   . HLD (hyperlipidemia)   . CAD (coronary artery disease)   . Depression   . Anxiety   . Hemorrhoids, external   . GERD (gastroesophageal reflux disease)   . Hyperplastic colonic polyp   . Diverticulosis of colon   . COPD (chronic obstructive pulmonary disease)   . Nephrolithiasis   . Myasthenia gravis   . Arthritis, rheumatoid     Past Surgical History  Procedure Date  . Total knee arthroplasty     bilateral  . Total hip arthroplasty     x3  . Spleenectomy   . Appendectomy   . Pacemaker insertion 2009    medtronic  .  Cardiac catheterization 10/12/2007    EF 60%    Family History  Problem Relation Age of Onset  . Hypertension    . Heart disease      History  Substance Use Topics  . Smoking status: Former Smoker -- 40 years    Types: Cigars    Quit date: 10/22/1987  . Smokeless tobacco: Never Used  . Alcohol Use: Yes     occasional      Review of Systems  Constitutional: Negative for fever.  HENT: Negative for neck pain.   Respiratory: Negative for shortness of breath.   Cardiovascular: Negative for chest pain.  Musculoskeletal: Negative for back pain.       Chronic right knee pain and left hip pain.  Skin: Positive for wound.  Neurological: Positive for loss of consciousness. Negative for numbness and headaches.  Psychiatric/Behavioral: Positive for confusion.  All other systems reviewed and are negative.    Allergies  Benazepril; Fosamax; and Remicade  Home Medications   Current Outpatient Rx  Name Route Sig Dispense Refill  . AMLODIPINE BESYLATE 5 MG PO TABS Oral Take 5 mg by mouth daily.      Marland Kitchen VITAMIN C 500 MG PO TABS Oral Take 500 mg by mouth daily.      . ASPIRIN 81 MG PO TBEC Oral Take 81 mg  by mouth daily.      Marland Kitchen PAPAYA ENZYME PO CHEW Oral Chew 1 tablet by mouth daily.      . DULOXETINE HCL 60 MG PO CPEP Oral Take 60 mg by mouth daily.      Marland Kitchen ESOMEPRAZOLE MAGNESIUM 40 MG PO CPDR Oral Take 40 mg by mouth daily before breakfast.      . GARLIC 500 MG PO CAPS Oral Take by mouth daily.      Marland Kitchen ONE-DAILY MULTI VITAMINS PO TABS Oral Take 1 tablet by mouth daily.      Marland Kitchen TRIAZOLAM 0.25 MG PO TABS Oral Take 0.5 mg by mouth at bedtime.      Marland Kitchen VALSARTAN 320 MG PO TABS Oral Take 320 mg by mouth daily.      . WARFARIN SODIUM 10 MG PO TABS Oral Take 10 mg by mouth as directed.      . WARFARIN SODIUM 5 MG PO TABS  TAKE AS DIRECTED PER DOCTOR 30 tablet 0    This will need to be refilled by Dr. Waynard Edwards. We ar ...    BP 145/86  Pulse 85  Temp(Src) 97.8 F (36.6 C) (Oral)  Resp 16   SpO2 100%  Physical Exam  Vitals reviewed. Constitutional: He is oriented to person, place, and time. He appears well-developed and well-nourished. No distress.  HENT:  Head: Head is with contusion and with laceration.       Contusion surrounding left eye.  Eyes: Conjunctivae, EOM and lids are normal. Pupils are equal, round, and reactive to light.  Neck: Neck supple.  Cardiovascular: Normal rate and regular rhythm.   Pulmonary/Chest: Effort normal and breath sounds normal.  Abdominal: Soft. There is no tenderness. There is no rebound and no guarding.  Musculoskeletal:       Right knee: Normal. He exhibits normal range of motion, no swelling, no effusion, no deformity, no laceration, no erythema, normal alignment, no LCL laxity and normal patellar mobility.  Neurological: He is alert and oriented to person, place, and time. No cranial nerve deficit. Coordination normal.  Skin: Skin is warm and dry. Laceration noted. No rash noted. No erythema. No pallor.          Patient has a small 2 cm superficial laceration superior to left eyebrow.  Psychiatric: He has a normal mood and affect. His behavior is normal.    ED Course  LACERATION REPAIR Performed by: Thomasene Lot Authorized by: Thomasene Lot Body area: head/neck Location details: forehead Laceration length: 2 cm Foreign bodies: glass Tendon involvement: none Nerve involvement: none Vascular damage: no Anesthesia: local infiltration Anesthetic total: 3 ml Patient sedated: no Preparation: Patient was prepped and draped in the usual sterile fashion. Irrigation solution: saline Amount of cleaning: extensive Debridement: minimal Skin closure: 5-0 Prolene Technique: simple Approximation: close Approximation difficulty: simple      Date: 09/09/2011  Rate: 76   Rhythm: sinus rhythm  QRS Axis: left  Intervals: PR prolonged  ST/T Wave abnormalities: normal  Conduction Disutrbances:first-degree A-V block , LBBB   Narrative Interpretation:   Old EKG Reviewed: changes noted: EKG in [id number] was electronically paced   MDM    8:50 PM patient reports he is unable to walk at home due to severe pain in hip and knee. Daughter is concerned for safety. States she has multiple falls 2 to hip and knee pain. States she's unable to care for him and has considered nursing home placement for him however patient is refusing this. Patient does  agree to stay overnight in the hospital until pain is better controlled and he is able to walk more steadily.   10:10 PM Spoke with Dr. Jacky Kindle. States he has had this several times for this patient and will only come to admit him and he agrees to go to a nursing home. Discussed this in great detail with the patient and he is absolutely refusing nursing home placement. However patient is unable to ambulate despite assistance is very unsteady and seems to fall forward. Monitor in the CCU. However discussed with patient's daughter that he is refusing to stay for admission/nursing home placement discussed with daughter that she will need to work with Child psychotherapist as well as patient's primary care physician to determine the safest plan for Mr. Maloof. Patient is agreeable to in-home nursing care.   Thomasene Lot, [place] 09/09/11 2227  Thomasene Lot, PA 09/09/11 [phone]
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
## 2011-09-10 MED ORDER — TRAMADOL HCL 50 MG PO TABS
50.0000 mg | ORAL_TABLET | Freq: Once | ORAL | Status: AC
  Administered 2011-09-10: 50 mg via ORAL
  Filled 2011-09-10: qty 1

## 2011-09-10 NOTE — ED Notes (Signed)
Pt is tearful complaining of pain in his right knee.medicated

## 2011-09-10 NOTE — Progress Notes (Signed)
Rec'd call from [facility] about options for patient to be able to go home. We discussed his limitations as well as concerns. I spoke with Irving Burton PA who agreed to order a stat PT evaluation so we can determine patient's safety with returning home. I called the PT department to inform them of the order.

## 2011-09-10 NOTE — ED Notes (Signed)
Pt pt continues to await ptr arrival. Called dispatch and they have yet to dispatch a truck to this location.

## 2011-09-10 NOTE — ED Provider Notes (Signed)
Medical screening examination/treatment/procedure(s) were performed by non-physician practitioner and as supervising physician I was immediately available for consultation/collaboration.   Benny Lennert, MD 09/10/11 [phone]

## 2011-09-10 NOTE — ED Notes (Signed)
Pt assisted to stand by bed to urinate. He only put out approx 50 cc. States he is having a lot of difficulty starting stream and emptying his bladder. Pt must hold on to stable object due to being unsteady. Staff in room with him for fall prevention
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
## 2011-09-10 NOTE — ED Notes (Signed)
Pt asking about his "cymbalta, nexium and asa" called pt pharmacy to verify these meds and found pt has not had nexium since 3/12, cymbalta since 8/12, and no record of asa use. They also advise he has not had his azathioprine filled since 10/12

## 2011-09-10 NOTE — ED Notes (Signed)
Breakfast ordered for pt.

## 2011-09-10 NOTE — Progress Notes (Signed)
Physical Therapy Evaluation: Evaluation performed with all data entered into PT Therapy but unable to repopulate into progress notes. Pt completed bed mobility and transfers modified independent, with supervision for ambulation with RW secondary to loss of balance. Pt scored 34 on Berg placing pt at a 100% fall risk. Pt adamant that he not discharge to SNF but agreeable to HHPT, RW, and tub bench all recommended. Goals set should pt remain in acute care environment. 24 hour supervision not available and pt states understanding of fall risk. Pt with constant R knee pain which is baseline and pt states contributes to falls. Thanks.  Toney Sang, PT [phone]

## 2011-09-10 NOTE — ED Notes (Signed)
Social worker aware of pt consult. She would like to be called when pt daughter arrives.
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
## 2011-09-10 NOTE — ED Notes (Signed)
Pt daughter and pa are at bedside talking with him about possibilities of going to facility
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
## 2011-09-10 NOTE — Progress Notes (Signed)
Per PT and physician orders, I discussed HH and DME choices with patient. Patient is agreeable for [facility]. I contacted Hilda Lias and Darren to set up [facility] PT/OT and social work as well as Engineer, structural. Patient is aware that [facility] will deliver the equipment to his home. Hilda Lias has been present in the CDU to meet with the patient prior to his departure. [facility] was contacted to transport patient to his home.

## 2011-10-11 ENCOUNTER — Other Ambulatory Visit (HOSPITAL_COMMUNITY): Payer: Self-pay | Admitting: Orthopedic Surgery

## 2011-10-11 DIAGNOSIS — M25561 Pain in right knee: Secondary | ICD-10-CM

## 2011-10-11 DIAGNOSIS — Z96659 Presence of unspecified artificial knee joint: Secondary | ICD-10-CM

## 2011-10-18 ENCOUNTER — Other Ambulatory Visit (HOSPITAL_COMMUNITY): Payer: Medicare Other

## 2011-10-18 ENCOUNTER — Encounter (HOSPITAL_COMMUNITY): Payer: Medicare Other

## 2011-10-31 ENCOUNTER — Encounter (HOSPITAL_COMMUNITY)
Admission: RE | Admit: 2011-10-31 | Discharge: 2011-10-31 | Disposition: A | Discharge status: Home / Self Care | Payer: Medicare Other | Source: Ambulatory Visit | Attending: Orthopedic Surgery | Admitting: Orthopedic Surgery

## 2011-10-31 ENCOUNTER — Ambulatory Visit (HOSPITAL_COMMUNITY)
Admission: RE | Admit: 2011-10-31 | Discharge: 2011-10-31 | Disposition: A | Discharge status: Home / Self Care | Payer: Medicare Other | Source: Ambulatory Visit | Attending: Orthopedic Surgery | Admitting: Orthopedic Surgery

## 2011-10-31 DIAGNOSIS — R948 Abnormal results of function studies of other organs and systems: Secondary | ICD-10-CM | POA: Insufficient documentation

## 2011-10-31 DIAGNOSIS — Z96659 Presence of unspecified artificial knee joint: Secondary | ICD-10-CM | POA: Insufficient documentation

## 2011-10-31 DIAGNOSIS — M25569 Pain in unspecified knee: Secondary | ICD-10-CM | POA: Insufficient documentation

## 2011-10-31 DIAGNOSIS — M7989 Other specified soft tissue disorders: Secondary | ICD-10-CM | POA: Insufficient documentation

## 2011-10-31 DIAGNOSIS — M25561 Pain in right knee: Secondary | ICD-10-CM

## 2011-10-31 DIAGNOSIS — T84038A Mechanical loosening of other internal prosthetic joint, initial encounter: Secondary | ICD-10-CM

## 2011-10-31 MED ORDER — TECHNETIUM TC 99M MEDRONATE IV KIT
25.0000 | PACK | Freq: Once | INTRAVENOUS | Status: AC | PRN
  Administered 2011-10-31: 25 via INTRAVENOUS

## 2011-12-18 ENCOUNTER — Other Ambulatory Visit (HOSPITAL_COMMUNITY): Payer: Self-pay | Admitting: Internal Medicine

## 2011-12-18 DIAGNOSIS — D472 Monoclonal gammopathy: Secondary | ICD-10-CM

## 2011-12-20 ENCOUNTER — Other Ambulatory Visit (HOSPITAL_COMMUNITY): Payer: Medicare Other

## 2011-12-23 ENCOUNTER — Ambulatory Visit (HOSPITAL_COMMUNITY)
Admission: RE | Admit: 2011-12-23 | Discharge: 2011-12-23 | Disposition: A | Discharge status: Home / Self Care | Payer: Medicare Other | Source: Ambulatory Visit | Attending: Internal Medicine | Admitting: Internal Medicine

## 2011-12-23 DIAGNOSIS — M47814 Spondylosis without myelopathy or radiculopathy, thoracic region: Secondary | ICD-10-CM | POA: Insufficient documentation

## 2011-12-23 DIAGNOSIS — M47812 Spondylosis without myelopathy or radiculopathy, cervical region: Secondary | ICD-10-CM | POA: Insufficient documentation

## 2011-12-23 DIAGNOSIS — Z96659 Presence of unspecified artificial knee joint: Secondary | ICD-10-CM | POA: Insufficient documentation

## 2011-12-23 DIAGNOSIS — J438 Other emphysema: Secondary | ICD-10-CM | POA: Insufficient documentation

## 2011-12-23 DIAGNOSIS — D472 Monoclonal gammopathy: Secondary | ICD-10-CM | POA: Insufficient documentation

## 2011-12-23 DIAGNOSIS — Z950 Presence of cardiac pacemaker: Secondary | ICD-10-CM | POA: Insufficient documentation

## 2011-12-27 ENCOUNTER — Other Ambulatory Visit (HOSPITAL_COMMUNITY): Payer: Self-pay | Admitting: Internal Medicine

## 2011-12-27 ENCOUNTER — Ambulatory Visit (HOSPITAL_COMMUNITY)
Admission: RE | Admit: 2011-12-27 | Discharge: 2011-12-27 | Disposition: A | Discharge status: Home / Self Care | Payer: Medicare Other | Source: Ambulatory Visit | Attending: Internal Medicine | Admitting: Internal Medicine

## 2011-12-27 DIAGNOSIS — R0602 Shortness of breath: Secondary | ICD-10-CM | POA: Insufficient documentation

## 2011-12-27 DIAGNOSIS — R911 Solitary pulmonary nodule: Secondary | ICD-10-CM | POA: Insufficient documentation

## 2011-12-27 DIAGNOSIS — J438 Other emphysema: Secondary | ICD-10-CM | POA: Insufficient documentation

## 2011-12-27 DIAGNOSIS — I517 Cardiomegaly: Secondary | ICD-10-CM | POA: Insufficient documentation

## 2011-12-27 DIAGNOSIS — J984 Other disorders of lung: Secondary | ICD-10-CM

## 2012-01-14 ENCOUNTER — Encounter: Payer: Self-pay | Admitting: Gastroenterology

## 2012-01-27 ENCOUNTER — Encounter: Payer: Medicare Other | Admitting: Internal Medicine

## 2012-02-11 ENCOUNTER — Other Ambulatory Visit: Payer: Self-pay

## 2012-02-11 ENCOUNTER — Encounter: Payer: Self-pay | Admitting: Gastroenterology

## 2012-02-11 ENCOUNTER — Other Ambulatory Visit (INDEPENDENT_AMBULATORY_CARE_PROVIDER_SITE_OTHER): Payer: Medicare Other

## 2012-02-11 ENCOUNTER — Other Ambulatory Visit: Payer: Self-pay | Admitting: Gastroenterology

## 2012-02-11 ENCOUNTER — Ambulatory Visit (INDEPENDENT_AMBULATORY_CARE_PROVIDER_SITE_OTHER): Payer: Medicare Other | Admitting: Gastroenterology

## 2012-02-11 VITALS — BP 110/58 | HR 68 | Ht 69.5 in | Wt 152.2 lb

## 2012-02-11 DIAGNOSIS — K7460 Unspecified cirrhosis of liver: Secondary | ICD-10-CM

## 2012-02-11 DIAGNOSIS — R188 Other ascites: Secondary | ICD-10-CM

## 2012-02-11 LAB — ALPHA-1-ANTITRYPSIN: A-1 Antitrypsin, Ser: 159 mg/dL (ref 90–200)

## 2012-02-11 LAB — CERULOPLASMIN: Ceruloplasmin: 32 mg/dL (ref 20–60)

## 2012-02-11 MED ORDER — FERROUS SULFATE 325 (65 FE) MG PO TABS: ORAL_TABLET | ORAL | Status: DC

## 2012-02-11 NOTE — Patient Instructions (Addendum)
Your physician has requested that you go to the basement for the following lab work before leaving today:Alpha 1 Antitrypsin, AMA, ANA, Anti smooth muscle antibody, Fe/TIBC, Ceruloplasmin. You have been scheduled for an endoscopy with propofol. Please follow written instructions given to you at your visit today. cc: Rodrigo Ran, MD

## 2012-02-11 NOTE — Progress Notes (Signed)
History of Present Illness: This is an 78 year old male who had a recent chest CT scan that showed nodular changes in the liver typical for cirrhosis, pleural effusions and ascites. Hepatitis B and hepatitis C markers were negative. He has a mildly elevated AST at 46 and the remainder of his LFTs were normal. AFP was normal. He has a macrocytic anemia with a hemoglobin of 10.5 and MCV of 124. He has a long history of alcohol use and was recently advised by Dr. Waynard Edwards to taper and then discontinue all alcohol use. He had abdominal distention and what sounds like an umbilical hernia associated with scrotal edema. He was started on furosemide and spironolactone and his edema has substantially improved. He states he has lost at least 25 pounds. Denies weight loss, abdominal pain, constipation, diarrhea, change in stool caliber, melena, hematochezia, nausea, vomiting, dysphagia, reflux symptoms, chest pain.  Allergies  Allergen Reactions  . Benazepril Other (See Comments)    Unknown.  . Celebrex (Celecoxib)   . Fosamax Other (See Comments)    Unknown.  . Remicade (Infliximab) Other (See Comments)    Unknown.   Outpatient Prescriptions Prior to Visit  Medication Sig Dispense Refill  . acetaminophen-codeine (TYLENOL #3) 300-30 MG per tablet Take 1 tablet by mouth as needed. For pain      . azaTHIOprine (IMURAN) 50 MG tablet Take 75 mg by mouth 3 (three) times daily.        Marland Kitchen losartan (COZAAR) 100 MG tablet Take 100 mg by mouth daily.        Marland Kitchen oxyCODONE-acetaminophen (PERCOCET) 5-325 MG per tablet Take 1-2 tablets by mouth every 6 (six) hours as needed. For pain       . triazolam (HALCION) 0.125 MG tablet Take 0.25 mg by mouth at bedtime. And 1 1/2 hours before dental appointments.      . traMADol (ULTRAM) 50 MG tablet Take 50 mg by mouth every 4 (four) hours as needed. Maximum dose= 8 tablets per day For pain.       Marland Kitchen warfarin (COUMADIN) 5 MG tablet Take 7.5 mg by mouth daily.         Past Medical  History  Diagnosis Date  . Complete heart block     s/p PPM  . PAF (paroxysmal atrial fibrillation)   . HTN (hypertension)   . HLD (hyperlipidemia)   . CAD (coronary artery disease)   . Depression   . Anxiety   . Hemorrhoids, external   . GERD (gastroesophageal reflux disease)   . Hyperplastic colonic polyp   . Diverticulosis of colon   . COPD (chronic obstructive pulmonary disease)   . Nephrolithiasis   . Myasthenia gravis   . Arthritis, rheumatoid   . BPH (benign prostatic hypertrophy)   . Cirrhosis of liver   . Asthma   . Alcohol abuse   . Hiatal hernia   . Skin cancer of forehead     and left ear  . DM (diabetes mellitus)   . Lung nodule    Past Surgical History  Procedure Date  . Total knee arthroplasty     bilateral  . Total hip arthroplasty     x3  . Splenectomy   . Appendectomy   . Pacemaker insertion 2009    medtronic  . Cardiac catheterization 10/12/2007    EF 60%  . Inguinal hernia repair    History   Social History  . Marital Status: Widowed    Spouse Name: N/A    Number  of Children: 4  . Years of Education: N/A   Occupational History  .  [employer]  . retired Hospital doctor    Social History Main Topics  . Smoking status: Former Smoker -- 40 years    Types: Cigars    Quit date: 10/22/1987  . Smokeless tobacco: Never Used  . Alcohol Use: Yes     occasional  . Drug Use: No  . Sexually Active: None   Other Topics Concern  . None   Social History Narrative  . None   Family History  Problem Relation Age of Onset  . Myasthenia gravis Mother   . Esophageal cancer Father   . HIV Son     Aids  . Diabetes Son     Review of Systems: Pertinent positive and negative review of systems were noted in the above HPI section. All other review of systems were otherwise negative. Physical Exam: General: Well developed , well nourished, chronically ill-appearing, no acute distress Head: Normocephalic and atraumatic Eyes:   sclerae anicteric, EOMI Ears: Normal auditory acuity Mouth: No deformity or lesions Neck: Supple, no masses or thyromegaly Lungs: Expiratory wheezes throughout  Heart: Regular rate and rhythm; no murmurs, rubs or bruits Abdomen: Soft, mild epigastric tenderness without rebound or guarding and non distended. No masses noted. The liver is palpable in the epigastrium and is tender. Left upper quadrant incision consistent with prior splenectomy. Small umbilical hernia defect. Normal Bowel sounds Musculoskeletal: Symmetrical with no gross deformities  Skin: No lesions on visible extremities Pulses:  Normal pulses noted Extremities: No clubbing, cyanosis noted. Bilateral ankle and pretibial edema.  Neurological: Alert oriented x 4, grossly nonfocal Cervical Nodes:  No significant cervical adenopathy Inguinal Nodes: No significant inguinal adenopathy Psychological:  Alert and cooperative. Normal mood and affect  Assessment and Recommendations:  1. Cirrhosis and ascites. Avoidance of all alcohol products. Minimize Tylenol to no more than 1000 mg daily. Standard serologies to evaluate for causes of cirrhosis however alcohol is a potential etiology. Continue a low-sodium diet and spironolactone and furosemide for management of ascites. Monitor electrolytes and diuretic dosing per Dr. Waynard Edwards. Schedule EGD to evaluate for varices. The risks, benefits, and alternatives to endoscopy with possible biopsy and possible dilation were discussed with the patient and they consent to proceed.   2. COPD.  3. Severe rheumatoid arthritis.  4. Macrocytic anemia.  5. Status post pacemaker placement

## 2012-02-12 LAB — ANA: Anti Nuclear Antibody(ANA): NEGATIVE

## 2012-02-13 ENCOUNTER — Encounter: Payer: Self-pay | Admitting: *Deleted

## 2012-02-13 ENCOUNTER — Ambulatory Visit (INDEPENDENT_AMBULATORY_CARE_PROVIDER_SITE_OTHER): Payer: Medicare Other | Admitting: Internal Medicine

## 2012-02-13 ENCOUNTER — Encounter: Payer: Self-pay | Admitting: Internal Medicine

## 2012-02-13 VITALS — BP 90/42 | HR 52 | Ht 69.5 in | Wt 156.4 lb

## 2012-02-13 DIAGNOSIS — I10 Essential (primary) hypertension: Secondary | ICD-10-CM

## 2012-02-13 DIAGNOSIS — I4891 Unspecified atrial fibrillation: Secondary | ICD-10-CM

## 2012-02-13 DIAGNOSIS — I442 Atrioventricular block, complete: Secondary | ICD-10-CM

## 2012-02-13 DIAGNOSIS — I441 Atrioventricular block, second degree: Secondary | ICD-10-CM

## 2012-02-13 DIAGNOSIS — R42 Dizziness and giddiness: Secondary | ICD-10-CM | POA: Insufficient documentation

## 2012-02-13 LAB — MITOCHONDRIAL ANTIBODIES: Mitochondrial M2 Ab, IgG: 0 (ref <0.91)

## 2012-02-13 NOTE — Assessment & Plan Note (Signed)
Recent postural dizziness due to hypotension. His BP is low today.  This may be due to pacemaker reversion to ERI.  These episodes could also be due to multiple antihypertensive medicines, multiple narcotics, or other issues.  He denies recent bleeding. At this point, I will hold his ARB and spironolactone.  He has follow-up with his PCP tomorrow. I will have him return to see Norma Fredrickson in 1 week.  If BP is improved, medicines could be slowly restarted at that time.

## 2012-02-13 NOTE — Progress Notes (Signed)
PCP:  Ezequiel Kayser, MD, MD Primary Cardiologist:  Dr [place]  The patient presents today for routine electrophysiology followup. He has difficulty with chronic back pain and takes narcotics chronically.  He reports chronic and frequent left arm numbness/ tingling.  He has noticed increased dizziness recently.  This is most noticeable upon standing. Today, he denies symptoms of palpitations, chest pain, shortness of breath, orthopnea, PND, lower extremity edema,  presyncope, syncope, or neurologic sequela.  The patient feels that he is tolerating medications without difficulties and is otherwise without complaint today.   Past Medical History  Diagnosis Date  . Complete heart block     s/p PPM  . PAF (paroxysmal atrial fibrillation)   . HTN (hypertension)   . HLD (hyperlipidemia)   . CAD (coronary artery disease)   . Depression   . Anxiety   . Hemorrhoids, external   . GERD (gastroesophageal reflux disease)   . Hyperplastic colonic polyp   . Diverticulosis of colon   . COPD (chronic obstructive pulmonary disease)   . Nephrolithiasis   . Myasthenia gravis   . Arthritis, rheumatoid   . BPH (benign prostatic hypertrophy)   . Cirrhosis of liver   . Asthma   . Alcohol abuse   . Hiatal hernia   . Skin cancer of forehead     and left ear  . DM (diabetes mellitus)   . Lung nodule    Past Surgical History  Procedure Date  . Total knee arthroplasty     bilateral  . Total hip arthroplasty     x3  . Splenectomy   . Appendectomy   . Pacemaker insertion 2009    medtronic  . Cardiac catheterization 10/12/2007    EF 60%  . Inguinal hernia repair     Current Outpatient Prescriptions  Medication Sig Dispense Refill  . acetaminophen-codeine (TYLENOL #3) 300-30 MG per tablet Take 1 tablet by mouth as needed. For pain      . azaTHIOprine (IMURAN) 50 MG tablet Take 75 mg by mouth 3 (three) times daily.        Marland Kitchen escitalopram (LEXAPRO) 10 MG tablet Take 10 mg by mouth daily.      .  furosemide (LASIX) 40 MG tablet Take 80 mg by mouth daily.      Marland Kitchen oxyCODONE-acetaminophen (PERCOCET) 5-325 MG per tablet Take 1-2 tablets by mouth every 6 (six) hours as needed. For pain       . triazolam (HALCION) 0.125 MG tablet Take 0.25 mg by mouth at bedtime. And 1 1/2 hours before dental appointments.      . ferrous sulfate 325 (65 FE) MG tablet 1 tablet po BID with meals for 2 months  30 tablet  1    Allergies  Allergen Reactions  . Benazepril Other (See Comments)    Unknown.  . Celebrex (Celecoxib)   . Fosamax Other (See Comments)    Unknown.  . Remicade (Infliximab) Other (See Comments)    Unknown.    History   Social History  . Marital Status: Widowed    Spouse Name: N/A    Number of Children: 4  . Years of Education: N/A   Occupational History  .  [employer]  . retired Hospital doctor    Social History Main Topics  . Smoking status: Former Smoker -- 40 years    Types: Cigars    Quit date: 10/22/1987  . Smokeless tobacco: Never Used  . Alcohol Use: Yes     occasional  .  Drug Use: No  . Sexually Active: Not on file   Other Topics Concern  . Not on file   Social History Narrative  . No narrative on file    Family History  Problem Relation Age of Onset  . Myasthenia gravis Mother   . Esophageal cancer Father   . HIV Son     Aids  . Diabetes Son    Physical Exam: Filed Vitals:   02/13/12 1642  BP: 90/42  Pulse: 52  Height: 5' 9.5" (1.765 m)  Weight: 156 lb 6.4 oz (70.943 kg)    GEN- elderly, chronically ill appearing  Head- normocephalic, atraumatic Eyes-  Sclera clear, conjunctiva pink Ears- hearing intact Oropharynx- clear Neck- supple, no JVP Lungs- Clear to ausculation bilaterally, normal work of breathing Chest- pacemaker pocket is well healed Heart- Regular rate and rhythm, no murmurs, rubs or gallops, PMI not laterally displaced GI- soft, NT, ND, + BS Extremities- no clubbing, cyanosis, or edema  Pacemaker  interrogation today reviewed--> see paceart  Assessment and Plan:

## 2012-02-13 NOTE — Assessment & Plan Note (Signed)
Hold ARB and spironolactone as above  Follow-up with Norma Fredrickson in 1 week.

## 2012-02-13 NOTE — Patient Instructions (Addendum)
Your physician recommends that you schedule a follow-up appointment next week with Sunday Spillers to follow up on BP 02/19/12 at 9:00am  Your physician has recommended you make the following change in your medication: 1)Stop Losartan 2) Stop Spironolactone   Will replace your device on 02/24/12 and give you instructions for that at your appointment with Lawson Fiscal

## 2012-02-13 NOTE — Assessment & Plan Note (Addendum)
Pacemaker interrogation today is abnormal.  Initial interrogation reveals that he had converted to a backup VVI pacing mode at 65 bpm and battery voltage could not be measured.  Replace device indicator was present and changes could not be made.  The device was then interrogated a second time at which battery longevity was 9.5 years and then device could now be reprogrammed DDD.  All lead measurements were normal.  I spoke at length with engineers at [employer].  They state that this is a rare (1:18,000) event that can occur with a MDT Adapta L device.  A software update has been delivered to the patient's device which industry feels will correct the problem.  They do not recommend device replacement at this time.  I will have the patient return tomorrow for repeat interrogation.  He will also need interrogation upon follow-up with Norma Fredrickson next week.  If these checks are normal, then we will repeat interrogation in 3 months and then per routine protocol thereafter.  IF he has further problems with the device then we could consider device replacement, however I would like to avoid this if possible.   More than 1 hour spent interrogating the device, speaking with Psychologist, counselling, and counseling the patient regarding his pacemaker findings today.

## 2012-02-14 ENCOUNTER — Encounter: Payer: Self-pay | Admitting: Internal Medicine

## 2012-02-14 ENCOUNTER — Ambulatory Visit (INDEPENDENT_AMBULATORY_CARE_PROVIDER_SITE_OTHER): Payer: Medicare Other | Admitting: *Deleted

## 2012-02-14 DIAGNOSIS — R0989 Other specified symptoms and signs involving the circulatory and respiratory systems: Secondary | ICD-10-CM

## 2012-02-14 LAB — PACEMAKER DEVICE OBSERVATION
AL AMPLITUDE: 4 mv
AL IMPEDENCE PM: 481 Ohm
ATRIAL PACING PM: 11
BRDY-0002RA: 60 {beats}/min
BRDY-0003RA: 130 {beats}/min
BRDY-0004RA: 130 {beats}/min
RV LEAD IMPEDENCE PM: 474 Ohm
VENTRICULAR PACING PM: 100

## 2012-02-14 NOTE — Progress Notes (Signed)
Pacer recheck

## 2012-02-17 ENCOUNTER — Encounter (HOSPITAL_COMMUNITY): Payer: Self-pay

## 2012-02-18 ENCOUNTER — Ambulatory Visit (AMBULATORY_SURGERY_CENTER): Payer: Medicare Other | Admitting: Gastroenterology

## 2012-02-18 ENCOUNTER — Encounter: Payer: Self-pay | Admitting: Gastroenterology

## 2012-02-18 VITALS — BP 141/77 | HR 74 | Temp 97.4°F | Resp 20 | Ht 69.0 in | Wt 152.0 lb

## 2012-02-18 DIAGNOSIS — K7460 Unspecified cirrhosis of liver: Secondary | ICD-10-CM

## 2012-02-18 DIAGNOSIS — R188 Other ascites: Secondary | ICD-10-CM

## 2012-02-18 MED ORDER — SODIUM CHLORIDE 0.9 % IV SOLN: 500.0000 mL | INTRAVENOUS | Status: DC

## 2012-02-18 NOTE — Progress Notes (Signed)
Patient did not have preoperative order for IV antibiotic SSI prophylaxis. (G8918)  Patient did not experience any of the following events: a burn prior to discharge; a fall within the facility; wrong site/side/patient/procedure/implant event; or a hospital transfer or hospital admission upon discharge from the facility. (G8907)  

## 2012-02-18 NOTE — Patient Instructions (Signed)
YOU HAD AN ENDOSCOPIC PROCEDURE TODAY AT THE [facility]: Refer to the procedure report that was given to you for any specific questions about what was found during the examination.  If the procedure report does not answer your questions, please call your gastroenterologist to clarify.  If you requested that your care partner not be given the details of your procedure findings, then the procedure report has been included in a sealed envelope for you to review at your convenience later.  YOU SHOULD EXPECT: Some feelings of bloating in the abdomen. Passage of more gas than usual.  Walking can help get rid of the air that was put into your GI tract during the procedure and reduce the bloating. If you had a lower endoscopy (such as a colonoscopy or flexible sigmoidoscopy) you may notice spotting of blood in your stool or on the toilet paper. If you underwent a bowel prep for your procedure, then you may not have a normal bowel movement for a few days.  DIET: Your first meal following the procedure should be a light meal and then it is ok to progress to your normal diet.  A half-sandwich or bowl of soup is an example of a good first meal.  Heavy or fried foods are harder to digest and may make you feel nauseous or bloated.  Likewise meals heavy in dairy and vegetables can cause extra gas to form and this can also increase the bloating.  Drink plenty of fluids but you should avoid alcoholic beverages for 24 hours.  ACTIVITY: Your care partner should take you home directly after the procedure.  You should plan to take it easy, moving slowly for the rest of the day.  You can resume normal activity the day after the procedure however you should NOT DRIVE or use heavy machinery for 24 hours (because of the sedation medicines used during the test).    SYMPTOMS TO REPORT IMMEDIATELY: A gastroenterologist can be reached at any hour.  During normal business hours, 8:30 AM to 5:00 PM Monday through Friday,  call [phone].  After hours and on weekends, please call the GI answering service at [phone] who will take a message and have the physician on call contact you.   Following lower endoscopy (colonoscopy or flexible sigmoidoscopy):  Excessive amounts of blood in the stool  Significant tenderness or worsening of abdominal pains  Swelling of the abdomen that is new, acute  Fever of 100F or higher  Following upper endoscopy (EGD)  Vomiting of blood or coffee ground material  New chest pain or pain under the shoulder blades  Painful or persistently difficult swallowing  New shortness of breath  Fever of 100F or higher  Black, tarry-looking stools  FOLLOW UP: If any biopsies were taken you will be contacted by phone or by letter within the next 1-3 weeks.  Call your gastroenterologist if you have not heard about the biopsies in 3 weeks.  Our staff will call the home number listed on your records the next business day following your procedure to check on you and address any questions or concerns that you may have at that time regarding the information given to you following your procedure. This is a courtesy call and so if there is no answer at the home number and we have not heard from you through the emergency physician on call, we will assume that you have returned to your regular daily activities without incident.  SIGNATURES/CONFIDENTIALITY: You and/or your care   partner have signed paperwork which will be entered into your electronic medical record.  These signatures attest to the fact that that the information above on your After Visit Summary has been reviewed and is understood.  Full responsibility of the confidentiality of this discharge information lies with you and/or your care-partner.  

## 2012-02-18 NOTE — Op Note (Signed)
[facility] [address] [place], [place]  [address]  ENDOSCOPY PROCEDURE REPORT  PATIENT:  [name], [name]  MR#:  [id number] BIRTHDATE:  04/11/1929, 82 yrs. old  GENDER:  male ENDOSCOPIST:  Judie Petit T. Russella Dar, MD, [facility] Referred by:  Rodrigo Ran, M.D. PROCEDURE DATE:  02/18/2012 PROCEDURE:  EGD, diagnostic 43235 ASA CLASS:  Class III INDICATIONS:  cirrhosis, evaluate for esophageal varices in a patient with cirrhosis. MEDICATIONS:  MAC sedation, administered by CRNA, propofol (Diprivan) 50 mg IV TOPICAL ANESTHETIC:  none DESCRIPTION OF PROCEDURE:   After the risks benefits and alternatives of the procedure were thoroughly explained, informed consent was obtained.  The LB GIF-H180 [id number] endoscope was introduced through the mouth and advanced to the second portion of the duodenum, without limitations.  The instrument was slowly withdrawn as the mucosa was fully examined. <<PROCEDUREIMAGES>> Portal gastropathy in the total stomach. It was erythematous and friable.  The duodenal bulb was normal in appearance, as was the postbulbar duodenum.  The esophagus and gastroesophageal junction were completely normal in appearance. No esophageal varices noted. Retroflexed views revealed no abnormalities and no gastric varices seen.  The scope was then withdrawn from the patient and the procedure completed.  COMPLICATIONS:  None  ENDOSCOPIC IMPRESSION: 1) Portal gastropathy 2) No esophageal or gastric varices  RECOMMENDATIONS: 1) OP follow-up as planned  Aasiya Creasey T. Russella Dar, MD, [name]  n. eSIGNED:   Venita Lick. Berneta Sconyers at 02/18/2012 02:48 PM  Lilla Shook, [phone]

## 2012-02-19 ENCOUNTER — Telehealth: Payer: Self-pay | Admitting: *Deleted

## 2012-02-19 ENCOUNTER — Ambulatory Visit: Payer: Medicare Other | Admitting: Nurse Practitioner

## 2012-02-19 NOTE — Telephone Encounter (Signed)
No answer.  No voice mail.  Unable to reach pt. With message.

## 2012-02-24 ENCOUNTER — Encounter (HOSPITAL_COMMUNITY): Payer: Self-pay

## 2012-02-24 ENCOUNTER — Ambulatory Visit (HOSPITAL_COMMUNITY): Admit: 2012-02-24 | Payer: Medicare Other | Admitting: Internal Medicine

## 2012-02-24 SURGERY — PACEMAKER GENERATOR CHANGE
Anesthesia: LOCAL

## 2012-03-06 ENCOUNTER — Other Ambulatory Visit: Payer: Self-pay | Admitting: Dermatology

## 2012-03-19 ENCOUNTER — Other Ambulatory Visit: Payer: Self-pay | Admitting: Dermatology

## 2012-03-23 ENCOUNTER — Encounter: Payer: Self-pay | Admitting: Internal Medicine

## 2012-03-24 ENCOUNTER — Ambulatory Visit (INDEPENDENT_AMBULATORY_CARE_PROVIDER_SITE_OTHER): Payer: Medicare Other | Admitting: Physician Assistant

## 2012-03-24 ENCOUNTER — Ambulatory Visit: Payer: Medicare Other | Admitting: Physician Assistant

## 2012-03-24 ENCOUNTER — Encounter: Payer: Self-pay | Admitting: Physician Assistant

## 2012-03-24 VITALS — BP 112/56 | HR 62 | Ht 69.5 in | Wt 148.0 lb

## 2012-03-24 DIAGNOSIS — I442 Atrioventricular block, complete: Secondary | ICD-10-CM

## 2012-03-24 DIAGNOSIS — I2510 Atherosclerotic heart disease of native coronary artery without angina pectoris: Secondary | ICD-10-CM

## 2012-03-24 DIAGNOSIS — R42 Dizziness and giddiness: Secondary | ICD-10-CM

## 2012-03-24 DIAGNOSIS — I10 Essential (primary) hypertension: Secondary | ICD-10-CM

## 2012-03-24 NOTE — Progress Notes (Signed)
[address] [place], [place]  [address] Phone: [phone] Fax:  [phone]  Date:  03/24/2012   Name:  [name] [name]   DOB:  [birth year]   MRN:  [id number]  PCP:  Ezequiel Kayser, MD, MD  Primary Cardiologist:  Dr. Peter Swaziland  Primary Electrophysiologist:  Dr. Hillis Range    History of Present Illness: [name] [name] is a 77 y.o. male who returns for follow up.  He has a history of CAD, HTN, HL, paroxysmal atrial fibrillation, complete heart block, status post pacemaker, GERD, COPD, rheumatoid arthritis, myasthenia gravis, chronic pain, alcohol abuse and cirrhosis.  Cardiac catheterization 09/2007: Luminal irregularities in the LM and LAD, D1 and D2 small vessels with proximal 80-90% stenosis, circumflex with luminal irregularities, OM2 mid 40-50%, EF 60%.  Echocardiogram 10/2006: EF 50-55%, trivial AI, mild MAC, peak RV systolic pressure 35-40.  He was seen by Dr. Johney Frame in followup 02/13/12.  His pacer had gone to backup VVI pacing mode.  There was apparently a software issue where initially it appeared that his device was at ERI.  This was a rare problem and a software update was employed.  He was to come back for repeat interrogation.  He was also having postural dizziness due to hypotension.  This may have been from mode switched to VVI.  His ARB and spironolactone were held with plans for follow up in one week.  He returns today for follow up.  He is somewhat of a difficult historian.  Notes a long hx of orthostatic intolerance.  This seems to be stable.  Also notes a long hx of atypical chest pain.  Pain occurs at rest and last seconds.  States it is worse in the last 6 mos.  But, denies it being more severe or more frequent.  No orthopnea, PND.  Has chronic LE edema.  No changes.  No exertional chest pain.  Notes stable exertional DOE.  Feels like his breathing is better with taking his inhalers.    Wt Readings from Last 3 Encounters:  03/24/12 148 lb (67.132  kg)  02/18/12 152 lb (68.947 kg)  02/13/12 156 lb 6.4 oz (70.943 kg)     Potassium  Date/Time Value Range Status  09/09/2011  5:59 PM 5.2* 3.5-5.1 (mEq/L) Final     Creatinine, Ser  Date/Time Value Range Status  09/09/2011  5:59 PM 0.50  0.50-1.35 (mg/dL) Final     ALT  Date/Time Value Range Status  09/09/2011  5:25 PM 14  0-53 (U/L) Final     TSH  Date/Time Value Range Status  09/05/2008  7:30 PM 1.348   Final     Hemoglobin  Date/Time Value Range Status  09/09/2011  5:59 PM 9.5* 13.0-17.0 (g/dL) Final    Past Medical History  Diagnosis Date  . Complete heart block     s/p PPM  . PAF (paroxysmal atrial fibrillation)   . HTN (hypertension)   . HLD (hyperlipidemia)   . CAD (coronary artery disease)   . Depression   . Anxiety   . Hemorrhoids, external   . GERD (gastroesophageal reflux disease)   . Hyperplastic colonic polyp   . Diverticulosis of colon   . COPD (chronic obstructive pulmonary disease)   . Nephrolithiasis   . Myasthenia gravis   . Arthritis, rheumatoid   . BPH (benign prostatic hypertrophy)   . Cirrhosis of liver   . Asthma   . Alcohol abuse   . Hiatal hernia   .  Skin cancer of forehead     and left ear  . DM (diabetes mellitus)   . Lung nodule     Current Outpatient Prescriptions  Medication Sig Dispense Refill  . aspirin EC 81 MG tablet Take 81 mg by mouth daily.      Marland Kitchen azaTHIOprine (IMURAN) 50 MG tablet Take 75 mg by mouth 3 (three) times daily.        . budesonide-formoterol (SYMBICORT) 160-4.5 MCG/ACT inhaler Inhale 2 puffs into the lungs 2 (two) times daily.      . Cholecalciferol (VITAMIN D) 2000 UNITS CAPS Take 1 capsule by mouth daily.      Marland Kitchen docusate sodium (COLACE) 100 MG capsule Take 200 mg by mouth 3 (three) times daily as needed. For constipation      . escitalopram (LEXAPRO) 10 MG tablet Take 5 mg by mouth daily.       Marland Kitchen esomeprazole (NEXIUM) 40 MG capsule Take 40 mg by mouth daily before breakfast.       . ferrous sulfate  325 (65 FE) MG tablet Take 325 mg by mouth daily with breakfast.      . FIBER PO Take 1 tablet by mouth daily.      . Omega-3 Fatty Acids (FISH OIL) 1000 MG CAPS Take 1 capsule by mouth daily.      Marland Kitchen oxyCODONE-acetaminophen (PERCOCET) 5-325 MG per tablet Take 1-2 tablets by mouth every 6 (six) hours as needed. For pain       . spironolactone (ALDACTONE) 50 MG tablet Take 25 mg by mouth daily.       . temazepam (RESTORIL) 15 MG capsule Take 15 mg by mouth at bedtime as needed. For sleep      . DISCONTD: ferrous sulfate 325 (65 FE) MG tablet 1 tablet po BID with meals for 2 months  30 tablet  1    Allergies: Allergies  Allergen Reactions  . Remicade (Infliximab) Other (See Comments)    Almost died  . Alendronate Sodium Other (See Comments)    Unknown.  . Benazepril Other (See Comments)    Unknown.  . Celebrex (Celecoxib) Nausea And Vomiting    History  Substance Use Topics  . Smoking status: Former Smoker -- 40 years    Types: Cigars    Quit date: 10/22/1987  . Smokeless tobacco: Never Used  . Alcohol Use: No     ROS:  Please see the history of present illness.  He has issues with chronic pain.  All other systems reviewed and negative.   PHYSICAL EXAM: VS:  BP 112/56  Pulse 62  Ht 5' 9.5" (1.765 m)  Wt 148 lb (67.132 kg)  BMI 21.54 kg/m2  SpO2 98% Well nourished, well developed, in no acute distress HEENT: normal Neck: no JVD Cardiac:  normal S1, S2; RRR; no murmur Lungs:  Decreased breath sounds bilaterally, expiratory wheezes throughout, no rales  Abd: soft, nontender, no hepatomegaly Ext: tight trace-1+ bilateral LE edema Skin: warm and dry Neuro:  CNs 2-12 intact, no focal abnormalities noted  Device Interrogation:  Pacer is functioning appropriately; Longevity is 9 years.  ASSESSMENT AND PLAN:  1.  Complete Heart Block Status post pacemaker. This was rechecked today. It was functioning appropriately and longevity is about 9 years.  2.  Postural  dizziness This is a chronic issue for him. Losartan had been discontinued by his PCP. Blood pressure looks optimal today. No further changes.  3.  Hypertension Controlled. Check a bmet today.  4.  Chest Pain Quite atypical. He has had this symptom for years. We had a discussion regarding whether or not to proceed with stress testing versus watchful waiting. At this point, I do not think he needs stress testing.  He would like to avoid it if at all possible. He can followup in 3 months. If his chest symptoms become more frequent or more severe, he will contact us. Of note, I recommended he follow up with Dr. Swaziland in 3 months.  The patient notes that he does not desire to see Dr. Swaziland any further.  I will set him up to see Dr. Johney Frame in 3 months.  5.  Coronary artery disease Continue aspirin.  6.  Atrial Fibrillation I am unaware as to why he is no longer on coumadin.   Signed, Tereso Newcomer, PA-C  3:31 PM 03/24/2012

## 2012-03-24 NOTE — Patient Instructions (Addendum)
Your physician recommends that you schedule a follow-up appointment in: 3 months with Dr Johney Frame   Your physician recommends that you return for lab work today (bmet)

## 2012-03-25 ENCOUNTER — Telehealth: Payer: Self-pay | Admitting: *Deleted

## 2012-03-25 LAB — BASIC METABOLIC PANEL
Chloride: 105 mEq/L (ref 96–112)
Creatinine, Ser: 0.5 mg/dL (ref 0.4–1.5)
Sodium: 137 mEq/L (ref 135–145)

## 2012-03-25 NOTE — Telephone Encounter (Signed)
No answer, mailed copy and highlighted Scott's comments

## 2012-03-25 NOTE — Telephone Encounter (Signed)
Message copied by Burnell Blanks on Wed Mar 25, 2012  3:28 PM ------      Message from: [place], [place] T      Created: Wed Mar 25, 2012  1:57 PM       [place], [place]  1:57 PM 03/25/2012

## 2012-06-04 ENCOUNTER — Other Ambulatory Visit: Payer: Self-pay | Admitting: Dermatology

## 2012-06-25 ENCOUNTER — Encounter: Payer: Self-pay | Admitting: Internal Medicine

## 2012-06-25 ENCOUNTER — Ambulatory Visit (INDEPENDENT_AMBULATORY_CARE_PROVIDER_SITE_OTHER): Payer: Medicare Other | Admitting: Internal Medicine

## 2012-06-25 VITALS — BP 110/56 | HR 61 | Ht 70.0 in | Wt 153.0 lb

## 2012-06-25 DIAGNOSIS — R42 Dizziness and giddiness: Secondary | ICD-10-CM

## 2012-06-25 DIAGNOSIS — I442 Atrioventricular block, complete: Secondary | ICD-10-CM

## 2012-06-25 DIAGNOSIS — I4891 Unspecified atrial fibrillation: Secondary | ICD-10-CM

## 2012-06-25 NOTE — Assessment & Plan Note (Signed)
Normal pacemaker function See Pace Art report AV intevals decreased today Mode switch turned on  Return to the device clinic in 6 months

## 2012-06-25 NOTE — Assessment & Plan Note (Signed)
Stable No change required today  

## 2012-06-25 NOTE — Assessment & Plan Note (Signed)
Maintaining sinus rhythm 

## 2012-06-25 NOTE — Progress Notes (Signed)
PCP: Ezequiel Kayser, MD  [name] [name] is a 78 y.o. male who presents today for routine electrophysiology followup.  Since last being seen in our clinic, the patient reports doing very well.   Today, he denies symptoms of palpitations,shortness of breath,  lower extremity edema, dizziness, presyncope, or syncope.  He has rare atypical fleeting chest pains which are stable.  The patient is otherwise without complaint today.   Past Medical History  Diagnosis Date  . Complete heart block     s/p PPM  . PAF (paroxysmal atrial fibrillation)   . HTN (hypertension)   . HLD (hyperlipidemia)   . CAD (coronary artery disease)   . Depression   . Anxiety   . Hemorrhoids, external   . GERD (gastroesophageal reflux disease)   . Hyperplastic colonic polyp   . Diverticulosis of colon   . COPD (chronic obstructive pulmonary disease)   . Nephrolithiasis   . Myasthenia gravis   . Arthritis, rheumatoid   . BPH (benign prostatic hypertrophy)   . Cirrhosis of liver   . Asthma   . Alcohol abuse   . Hiatal hernia   . Skin cancer of forehead     and left ear  . DM (diabetes mellitus)   . Lung nodule    Past Surgical History  Procedure Date  . Total knee arthroplasty     bilateral  . Total hip arthroplasty     x3  . Splenectomy   . Appendectomy   . Pacemaker insertion 2009    [employer]  . Cardiac catheterization 10/12/2007    EF 60%  . Inguinal hernia repair     Current Outpatient Prescriptions  Medication Sig Dispense Refill  . aspirin EC 81 MG tablet Take 81 mg by mouth daily.      Marland Kitchen azaTHIOprine (IMURAN) 50 MG tablet Take 75 mg by mouth 3 (three) times daily.        . budesonide-formoterol (SYMBICORT) 160-4.5 MCG/ACT inhaler Inhale 2 puffs into the lungs 2 (two) times daily.      Marland Kitchen docusate sodium (COLACE) 100 MG capsule Take 200 mg by mouth 3 (three) times daily as needed. For constipation      . escitalopram (LEXAPRO) 10 MG tablet Take 5 mg by mouth daily.       Marland Kitchen esomeprazole  (NEXIUM) 40 MG capsule Take 40 mg by mouth daily before breakfast.       . FIBER PO Take 1 tablet by mouth daily.      . Omega-3 Fatty Acids (FISH OIL) 1000 MG CAPS Take 1 capsule by mouth daily.      Marland Kitchen oxyCODONE-acetaminophen (PERCOCET) 5-325 MG per tablet Take 1-2 tablets by mouth every 6 (six) hours as needed. For pain       . spironolactone (ALDACTONE) 50 MG tablet Take 25 mg by mouth daily.       . temazepam (RESTORIL) 15 MG capsule Take 15 mg by mouth at bedtime as needed. For sleep      . triamcinolone cream (KENALOG) 0.1 % as needed.        Physical Exam: Filed Vitals:   06/25/12 1449  BP: 110/56  Pulse: 61  Height: 5\' 10"  (1.778 m)  Weight: 153 lb (69.4 kg)    GEN- The patient is chronically ill appearing, alert and oriented x 3 today.   Head- normocephalic, atraumatic Eyes-  Sclera clear, conjunctiva pink Ears- hearing intact Oropharynx- clear Lungs- prolonged expiratory phase, Heart- Regular rate and rhythm,  GI- soft, NT, ND, + BS Extremities- no clubbing, cyanosis, +1edema  Sinus with first degree AV block and V pacing  Pacemaker interrogation reviewed  Assessment and Plan:

## 2012-06-25 NOTE — Patient Instructions (Addendum)
Your physician wants you to follow-up in: 6 months with device clinic and [facility] and 12 months with Dr Johney Frame.  You will receive a reminder letter in the mail two months in advance. If you don't receive a letter, please call our office to schedule the follow-up appointment.

## 2012-06-29 ENCOUNTER — Encounter: Payer: Medicare Other | Admitting: Internal Medicine

## 2012-12-31 ENCOUNTER — Ambulatory Visit (INDEPENDENT_AMBULATORY_CARE_PROVIDER_SITE_OTHER): Payer: Medicare Other | Admitting: *Deleted

## 2012-12-31 ENCOUNTER — Encounter: Payer: Self-pay | Admitting: Internal Medicine

## 2012-12-31 ENCOUNTER — Other Ambulatory Visit: Payer: Self-pay | Admitting: Internal Medicine

## 2012-12-31 DIAGNOSIS — I4891 Unspecified atrial fibrillation: Secondary | ICD-10-CM

## 2012-12-31 DIAGNOSIS — I442 Atrioventricular block, complete: Secondary | ICD-10-CM

## 2012-12-31 LAB — PACEMAKER DEVICE OBSERVATION
AL AMPLITUDE: 2.8 mv
AL IMPEDENCE PM: 501 Ohm
BAMS-0001: 175 {beats}/min
BATTERY VOLTAGE: 2.78 V
BRDY-0002RV: 60 {beats}/min
VENTRICULAR PACING PM: 100

## 2012-12-31 NOTE — Progress Notes (Signed)
PPM check 

## 2013-02-05 ENCOUNTER — Encounter (HOSPITAL_COMMUNITY): Payer: Self-pay | Admitting: *Deleted

## 2013-02-05 ENCOUNTER — Emergency Department (HOSPITAL_COMMUNITY): Payer: Medicare Other

## 2013-02-05 ENCOUNTER — Emergency Department (HOSPITAL_COMMUNITY)
Admission: EM | Admit: 2013-02-05 | Discharge: 2013-02-05 | Disposition: A | Discharge status: Home / Self Care | Payer: Medicare Other | Attending: Emergency Medicine | Admitting: Emergency Medicine

## 2013-02-05 DIAGNOSIS — F3289 Other specified depressive episodes: Secondary | ICD-10-CM | POA: Insufficient documentation

## 2013-02-05 DIAGNOSIS — G7000 Myasthenia gravis without (acute) exacerbation: Secondary | ICD-10-CM | POA: Insufficient documentation

## 2013-02-05 DIAGNOSIS — Z7982 Long term (current) use of aspirin: Secondary | ICD-10-CM | POA: Insufficient documentation

## 2013-02-05 DIAGNOSIS — J4489 Other specified chronic obstructive pulmonary disease: Secondary | ICD-10-CM | POA: Insufficient documentation

## 2013-02-05 DIAGNOSIS — R51 Headache: Secondary | ICD-10-CM | POA: Insufficient documentation

## 2013-02-05 DIAGNOSIS — Z860100 Personal history of colon polyps, unspecified: Secondary | ICD-10-CM | POA: Insufficient documentation

## 2013-02-05 DIAGNOSIS — E785 Hyperlipidemia, unspecified: Secondary | ICD-10-CM

## 2013-02-05 DIAGNOSIS — Z85828 Personal history of other malignant neoplasm of skin: Secondary | ICD-10-CM | POA: Insufficient documentation

## 2013-02-05 DIAGNOSIS — K219 Gastro-esophageal reflux disease without esophagitis: Secondary | ICD-10-CM | POA: Insufficient documentation

## 2013-02-05 DIAGNOSIS — J449 Chronic obstructive pulmonary disease, unspecified: Secondary | ICD-10-CM | POA: Insufficient documentation

## 2013-02-05 DIAGNOSIS — R42 Dizziness and giddiness: Secondary | ICD-10-CM | POA: Insufficient documentation

## 2013-02-05 DIAGNOSIS — Z87448 Personal history of other diseases of urinary system: Secondary | ICD-10-CM | POA: Insufficient documentation

## 2013-02-05 DIAGNOSIS — K529 Noninfective gastroenteritis and colitis, unspecified: Secondary | ICD-10-CM

## 2013-02-05 DIAGNOSIS — R197 Diarrhea, unspecified: Secondary | ICD-10-CM | POA: Insufficient documentation

## 2013-02-05 DIAGNOSIS — M069 Rheumatoid arthritis, unspecified: Secondary | ICD-10-CM | POA: Insufficient documentation

## 2013-02-05 DIAGNOSIS — Z8679 Personal history of other diseases of the circulatory system: Secondary | ICD-10-CM | POA: Insufficient documentation

## 2013-02-05 DIAGNOSIS — Z950 Presence of cardiac pacemaker: Secondary | ICD-10-CM | POA: Insufficient documentation

## 2013-02-05 DIAGNOSIS — F1010 Alcohol abuse, uncomplicated: Secondary | ICD-10-CM | POA: Insufficient documentation

## 2013-02-05 DIAGNOSIS — F329 Major depressive disorder, single episode, unspecified: Secondary | ICD-10-CM | POA: Insufficient documentation

## 2013-02-05 DIAGNOSIS — Z9889 Other specified postprocedural states: Secondary | ICD-10-CM | POA: Insufficient documentation

## 2013-02-05 DIAGNOSIS — Z87891 Personal history of nicotine dependence: Secondary | ICD-10-CM | POA: Insufficient documentation

## 2013-02-05 DIAGNOSIS — K7460 Unspecified cirrhosis of liver: Secondary | ICD-10-CM | POA: Insufficient documentation

## 2013-02-05 DIAGNOSIS — R1084 Generalized abdominal pain: Secondary | ICD-10-CM | POA: Insufficient documentation

## 2013-02-05 DIAGNOSIS — I2510 Atherosclerotic heart disease of native coronary artery without angina pectoris: Secondary | ICD-10-CM

## 2013-02-05 DIAGNOSIS — Z87442 Personal history of urinary calculi: Secondary | ICD-10-CM | POA: Insufficient documentation

## 2013-02-05 DIAGNOSIS — F411 Generalized anxiety disorder: Secondary | ICD-10-CM | POA: Insufficient documentation

## 2013-02-05 DIAGNOSIS — Z8719 Personal history of other diseases of the digestive system: Secondary | ICD-10-CM | POA: Insufficient documentation

## 2013-02-05 DIAGNOSIS — K649 Unspecified hemorrhoids: Secondary | ICD-10-CM | POA: Insufficient documentation

## 2013-02-05 DIAGNOSIS — Z9861 Coronary angioplasty status: Secondary | ICD-10-CM | POA: Insufficient documentation

## 2013-02-05 DIAGNOSIS — I509 Heart failure, unspecified: Secondary | ICD-10-CM | POA: Insufficient documentation

## 2013-02-05 DIAGNOSIS — Z8709 Personal history of other diseases of the respiratory system: Secondary | ICD-10-CM | POA: Insufficient documentation

## 2013-02-05 DIAGNOSIS — I10 Essential (primary) hypertension: Secondary | ICD-10-CM | POA: Insufficient documentation

## 2013-02-05 DIAGNOSIS — K5289 Other specified noninfective gastroenteritis and colitis: Secondary | ICD-10-CM | POA: Insufficient documentation

## 2013-02-05 DIAGNOSIS — J029 Acute pharyngitis, unspecified: Secondary | ICD-10-CM | POA: Insufficient documentation

## 2013-02-05 DIAGNOSIS — Z9089 Acquired absence of other organs: Secondary | ICD-10-CM | POA: Insufficient documentation

## 2013-02-05 DIAGNOSIS — Z79899 Other long term (current) drug therapy: Secondary | ICD-10-CM | POA: Insufficient documentation

## 2013-02-05 DIAGNOSIS — R112 Nausea with vomiting, unspecified: Secondary | ICD-10-CM | POA: Insufficient documentation

## 2013-02-05 HISTORY — DX: Heart failure, unspecified: I50.9

## 2013-02-05 LAB — URINALYSIS, MICROSCOPIC ONLY
Glucose, UA: NEGATIVE mg/dL
Ketones, ur: NEGATIVE mg/dL
Leukocytes, UA: NEGATIVE
Nitrite: NEGATIVE
Protein, ur: NEGATIVE mg/dL
pH: 5 (ref 5.0–8.0)

## 2013-02-05 LAB — CBC WITH DIFFERENTIAL/PLATELET
Basophils Relative: 0 % (ref 0–1)
Eosinophils Absolute: 0 10*3/uL (ref 0.0–0.7)
HCT: 36.2 % — ABNORMAL LOW (ref 39.0–52.0)
Hemoglobin: 13 g/dL (ref 13.0–17.0)
MCH: 35.6 pg — ABNORMAL HIGH (ref 26.0–34.0)
MCHC: 35.9 g/dL (ref 30.0–36.0)
Monocytes Absolute: 0.4 10*3/uL (ref 0.1–1.0)
Monocytes Relative: 5 % (ref 3–12)
Neutrophils Relative %: 88 % — ABNORMAL HIGH (ref 43–77)

## 2013-02-05 LAB — COMPREHENSIVE METABOLIC PANEL
Albumin: 3.6 g/dL (ref 3.5–5.2)
BUN: 20 mg/dL (ref 6–23)
Calcium: 9.1 mg/dL (ref 8.4–10.5)
Creatinine, Ser: 0.51 mg/dL (ref 0.50–1.35)
Total Protein: 8 g/dL (ref 6.0–8.3)

## 2013-02-05 LAB — LIPASE, BLOOD: Lipase: 24 U/L (ref 11–59)

## 2013-02-05 MED ORDER — IOHEXOL 300 MG/ML  SOLN
50.0000 mL | Freq: Once | INTRAMUSCULAR | Status: AC | PRN
  Administered 2013-02-05: 50 mL via ORAL

## 2013-02-05 MED ORDER — ONDANSETRON HCL 4 MG PO TABS: 4.0000 mg | ORAL_TABLET | Freq: Four times a day (QID) | ORAL | Status: DC

## 2013-02-05 MED ORDER — ONDANSETRON HCL 4 MG/2ML IJ SOLN
4.0000 mg | Freq: Once | INTRAMUSCULAR | Status: AC
  Administered 2013-02-05: 4 mg via INTRAVENOUS
  Filled 2013-02-05: qty 2

## 2013-02-05 MED ORDER — HYDROMORPHONE HCL PF 1 MG/ML IJ SOLN
0.5000 mg | Freq: Once | INTRAMUSCULAR | Status: AC
  Administered 2013-02-05: 0.5 mg via INTRAVENOUS
  Filled 2013-02-05: qty 1

## 2013-02-05 MED ORDER — SODIUM CHLORIDE 0.9 % IV BOLUS (SEPSIS)
1000.0000 mL | Freq: Once | INTRAVENOUS | Status: AC
  Administered 2013-02-05: 1000 mL via INTRAVENOUS

## 2013-02-05 MED ORDER — IOHEXOL 300 MG/ML  SOLN
100.0000 mL | Freq: Once | INTRAMUSCULAR | Status: AC | PRN
  Administered 2013-02-05: 100 mL via INTRAVENOUS

## 2013-02-05 NOTE — ED Provider Notes (Signed)
History     CSN: [id number]  Arrival date & time 02/05/13  1520   First MD Initiated Contact with Patient 02/05/13 1619      Chief Complaint  Patient presents with  . Hematemesis  . Abdominal Pain    (Consider location/radiation/quality/duration/timing/severity/associated sxs/prior treatment) HPI Comments: Patient is an 79 y/o M with PMHx of HTN, CAD, HLD, anxiety, complete heart block with PPM placement approximately 5-6 years ago recently checked last month, GERD, cirrhosis of liver, hital hernia, DM presenting to the ED with abdominal pain, nausea, vomiting, and diarrhea that started today. Patient reported abdominal pain to be generalized, constant described as an "awful sick" feeling, patient was unable to explain the exact feeling of pain from the abdomen, with mild radiation to the flanks bilaterally. Patient reported that the nausea is severe. Reported 7 episodes of vomiting that consisted of streaks of blood and mainly food contents with 7-8 episodes of loose stool that did not consist of blood or mucous. Patient reported that he is unable to keep anything down, fluid or food, it just comes right back up. Reported sore throat with the vomiting. Associated symptoms are headaches. Denied fever, chills, chest pain, shortness of breathe, difficulty breathing, urinary symptoms, weakness, numbness and paresthesias to the extremities, visual distortions, confusion, eye complaints, ear complaints.    The history is provided by the patient. No language interpreter was used.    Past Medical History  Diagnosis Date  . Complete heart block     s/p PPM  . PAF (paroxysmal atrial fibrillation)   . HTN (hypertension)   . HLD (hyperlipidemia)   . CAD (coronary artery disease)   . Depression   . Anxiety   . Hemorrhoids, external   . GERD (gastroesophageal reflux disease)   . Hyperplastic colonic polyp   . Diverticulosis of colon   . COPD (chronic obstructive pulmonary disease)   .  Myasthenia gravis   . Arthritis, rheumatoid   . BPH (benign prostatic hypertrophy)   . Cirrhosis of liver   . Asthma   . Alcohol abuse   . Hiatal hernia   . Skin cancer of forehead     and left ear  . Lung nodule   . Nephrolithiasis   . CHF (congestive heart failure)     Past Surgical History  Procedure Laterality Date  . Total knee arthroplasty      bilateral  . Total hip arthroplasty      x3  . Splenectomy    . Appendectomy    . Pacemaker insertion  2009    medtronic  . Cardiac catheterization  10/12/2007    EF 60%  . Inguinal hernia repair      Family History  Problem Relation Age of Onset  . Myasthenia gravis Mother   . Esophageal cancer Father   . HIV Son     Aids  . Diabetes Son     History  Substance Use Topics  . Smoking status: Former Smoker -- 40 years    Types: Cigars    Quit date: 10/22/1987  . Smokeless tobacco: Never Used  . Alcohol Use: 8.4 oz/week    14 Cans of beer per week      Review of Systems  Constitutional: Negative for fever and chills.  HENT: Positive for sore throat. Negative for ear pain, trouble swallowing, neck stiffness and tinnitus.   Eyes: Negative for pain and visual disturbance.  Respiratory: Negative for chest tightness and shortness of breath.   Cardiovascular:  Negative for chest pain.  Gastrointestinal: Positive for nausea, vomiting, abdominal pain and diarrhea. Negative for constipation and blood in stool.  Genitourinary: Negative for dysuria, hematuria, decreased urine volume and difficulty urinating.  Musculoskeletal: Negative for myalgias and arthralgias.  Skin: Negative for rash.  Neurological: Positive for dizziness and headaches. Negative for weakness, light-headedness and numbness.  All other systems reviewed and are negative.    Allergies  Remicade; Alendronate sodium; Benazepril; and Celebrex  Home Medications   Current Outpatient Rx  Name  Route  Sig  Dispense  Refill  . aspirin EC 81 MG tablet    Oral   Take 81 mg by mouth daily.         Marland Kitchen azaTHIOprine (IMURAN) 50 MG tablet   Oral   Take 75 mg by mouth 3 (three) times daily.           . budesonide-formoterol (SYMBICORT) 160-4.5 MCG/ACT inhaler   Inhalation   Inhale 2 puffs into the lungs 2 (two) times daily.         Marland Kitchen docusate sodium (COLACE) 100 MG capsule   Oral   Take 100 mg by mouth 2 (two) times daily as needed for constipation.         Marland Kitchen escitalopram (LEXAPRO) 10 MG tablet   Oral   Take 10 mg by mouth daily.          Marland Kitchen esomeprazole (NEXIUM) 40 MG capsule   Oral   Take 40 mg by mouth daily before breakfast.          . FIBER PO   Oral   Take 1 tablet by mouth daily.         Marland Kitchen morphine (MS CONTIN) 15 MG 12 hr tablet   Oral   Take 15 mg by mouth 2 (two) times daily.         . Omega-3 Fatty Acids (FISH OIL) 1000 MG CAPS   Oral   Take 1 capsule by mouth daily.         Marland Kitchen oxyCODONE-acetaminophen (PERCOCET/ROXICET) 5-325 MG per tablet   Oral   Take 1 tablet by mouth every 4 (four) hours as needed for pain.         Marland Kitchen spironolactone (ALDACTONE) 50 MG tablet   Oral   Take 25 mg by mouth daily.          . temazepam (RESTORIL) 15 MG capsule   Oral   Take 15 mg by mouth at bedtime as needed. For sleep         . triamcinolone cream (KENALOG) 0.1 %   Topical   Apply 1 application topically 2 (two) times daily.          . ondansetron (ZOFRAN) 4 MG tablet   Oral   Take 1 tablet (4 mg total) by mouth every 6 (six) hours.   12 tablet   0     BP 156/63  Pulse 61  Temp(Src) 98.6 F (37 C) (Oral)  Resp 12  Ht 5\' 10"  (1.778 m)  Wt 154 lb (69.854 kg)  BMI 22.1 kg/m2  SpO2 98%  Physical Exam  Nursing note and vitals reviewed. Constitutional: He is oriented to person, place, and time. He appears well-developed and well-nourished. No distress.  HENT:  Head: Normocephalic and atraumatic.  Nose: Nose normal.  Mouth/Throat: Oropharynx is clear and moist. No oropharyngeal exudate.   Eyes: Conjunctivae and EOM are normal. Pupils are equal, round, and reactive to light. Right eye exhibits no  discharge. Left eye exhibits no discharge.  Neck: Normal range of motion. Neck supple. No tracheal deviation present. No thyromegaly present.  Negative lymphadenopathy  Cardiovascular: Normal rate, regular rhythm, normal heart sounds and intact distal pulses.  Exam reveals no friction rub.   No murmur heard. Pulmonary/Chest: Effort normal and breath sounds normal. No respiratory distress. He has no wheezes. He has no rales. He exhibits no tenderness.  Abdominal: Soft. Bowel sounds are normal. He exhibits no distension and no mass. There is tenderness. There is no rebound and no guarding.  Generalized tenderness to all 4 quadrants with soft palpation  Genitourinary: Guaiac negative stool.  Rectal exam: Positive hemorrhoids noted. Mild redness to anal region due to diarrhea. Good rectal sphincter tone. No masses, lesions noted on exam. Prostate not enlarged, no pain upon palpation. No blood on glove. No pain upon exam.    Lymphadenopathy:    He has no cervical adenopathy.  Neurological: He is alert and oriented to person, place, and time. No cranial nerve deficit. He exhibits normal muscle tone. Coordination normal.  Skin: Skin is warm and dry. No rash noted. He is not diaphoretic. No erythema.  Psychiatric: He has a normal mood and affect. His behavior is normal. Thought content normal.    ED Course  Procedures (including critical care time)  Labs Reviewed  CBC WITH DIFFERENTIAL - Abnormal; Notable for the following:    RBC 3.65 (*)    HCT 36.2 (*)    MCH 35.6 (*)    RDW 16.3 (*)    Neutrophils Relative 88 (*)    Lymphocytes Relative 7 (*)    Lymphs Abs 0.5 (*)    All other components within normal limits  COMPREHENSIVE METABOLIC PANEL - Abnormal; Notable for the following:    Glucose, Bld 127 (*)    AST 43 (*)    Total Bilirubin 1.6 (*)    All other components within normal  limits  URINALYSIS, MICROSCOPIC ONLY - Abnormal; Notable for the following:    Hgb urine dipstick MODERATE (*)    Bilirubin Urine SMALL (*)    All other components within normal limits  LIPASE, BLOOD  LACTIC ACID, PLASMA  OCCULT BLOOD, POC DEVICE   Ct Abdomen Pelvis W Contrast  02/05/2013  *RADIOLOGY REPORT*  Clinical Data: Nausea/vomiting, diffuse abdominal pain  CT ABDOMEN AND PELVIS WITH CONTRAST  Technique:  Multidetector CT imaging of the abdomen and pelvis was performed following the standard protocol during bolus administration of intravenous contrast.  Contrast: OMNIPAQUE IOHEXOL 300 MG/ML  SOLN  Comparison: None.  Findings: Small bilateral pleural effusions, right greater than left.  Mildly nodular hepatic contour, correlate for cirrhosis.  Prior splenectomy.  Pancreas and adrenal glands are within normal limits.  Gallbladder is grossly unremarkable.  No intrahepatic or extrahepatic ductal dilatation.  Kidneys are within normal limits.  No hydronephrosis.  No evidence of bowel obstruction.  Although poorly evaluated due to lack of contrast within the colon at the time of imaging, there is possible wall thickening involving the cecum/ascending colon (for example, series 2/image 55), which could reflect mild infectious/inflammatory colitis.  Atherosclerotic calcifications of the abdominal aorta and branch vessels.  The celiac artery, SMA, and IMA remain patent.  Small retroperitoneal lymph nodes measuring up to 9 mm short axis, which does not meet pathologic CT size criteria.  Small volume abdominopelvic ascites.  Prostate is unremarkable.  Bladder is underdistended.  Degenerative changes of the visualized thoracolumbar spine.  IMPRESSION: Possible infectious/inflammatory colitis involving  the cecum/ascending colon, equivocal.  Celiac artery, SMA, and IMA remain patent.  Suspected cirrhosis with small volume abdominopelvic ascites. Prior splenectomy.  Gallbladder is grossly unremarkable by CT.   If there is continued clinical concern, consider right upper quadrant ultrasound.  Small bilateral pleural effusions, right greater than left.   Original Report Authenticated By: Charline Bills, M.D.      1. Gastroenteritis   2. Cirrhosis   3. CAD (coronary artery disease)   4. CHF (congestive heart failure)   5. HTN (hypertension)   6. HLD (hyperlipidemia)       MDM  Patient afebrile, normotensive, non-tachycardic, alert and oriented. I personally evaluated and examined the patient. No confusion, knows where he is and what is going on. Generalized abdominal pain with soft palpation. IV saline lock placed, IV NS fluids given, Zofran IV for nausea, IV Dilaudid for pain.   DDx: Cholecystitis Mesenteric ischemia Bowel obstruction  Lactic acid within normal limits - r/o mesenteric ischemia. CMP and CBC negative findings. Lipase within normal limits - r/o pancreatitis. UA findings - positive for blood in urine. Fecal occult blood negative. CT abdomen pelvis with contrast - infectious colitis noted involving the cecum/ascending colon - negative findings for gallbladder - celiac, SMA, and IMA arteries remain patent r/o mesenteric ischemia.   Patient re-evaluated at 7:23pm - reported improvement in discomfort. Patient able to tolerate PO - gingerale and crackers. Patient aseptic, non-toxic appearing, in no acute distress. Pain controlled in ED setting. No sign of mesenteric ischemia, obstruction, cholecystitis, kidney stones. Diagnosed with Infectious colitis - possible viral in nature. Discharged patient. Discharged patient with Zofran for nausea - discussed with patient on how to take medicatons. Discussed with patient to follow-up with PCP regarding visit to the ED, cirrhosis, kidney function. Discussed with patient to stay hydrated, eat lite foods, rest. Discussed with patient to monitor symptoms and if symptoms worsen or change to report back to the ED. Patient agreed to plan, understood, all  questions answered.           Raymon Mutton, PA-C 02/06/13 [phone]

## 2013-02-05 NOTE — ED Notes (Signed)
Called pts daughter at pt request. Daughter reports her father "takes large amounts of pain medication daily. He takes morphine and Oxy with beer." Reports father drinks beer daily.

## 2013-02-05 NOTE — ED Notes (Signed)
PT with acute onset emesis and abd pain when he awoke.  Emesis x 8 since this am.  Emesis started as a bright red color with clumps of food and turned into a coffee ground color. Hx of cirrhosis.

## 2013-02-05 NOTE — ED Notes (Signed)
Pt brought to Pod A room 3 from triage by Theone Murdoch, EMT.

## 2013-02-06 NOTE — ED Provider Notes (Signed)
Medical screening examination/treatment/procedure(s) were conducted as a shared visit with non-physician practitioner(s) and myself.  I personally evaluated the patient during the encounter Pt is a poor historian with mmp including cirrhosis, chronic pain and ongoing etoh use and narcotics who presents with N/V/D and possible hematemesis.  Story keeps changing but states vomiting BRB but then only small streaks of blood.  No peritoneal signs and diffuse abd pain.  Hemoccult neg from below and no hx of esophageal varicies.  Low suspicion for bleed.  Feel most likely viral origin and CT shows colitis but no focal findings.  Pt tolerating po's and dc/ed home.  Gwyneth Sprout, MD 02/06/13 1356

## 2013-03-05 ENCOUNTER — Telehealth: Payer: Self-pay | Admitting: Internal Medicine

## 2013-03-05 NOTE — Telephone Encounter (Signed)
Dr. Johney Frame reviewed 12/31/12 PPM check, no changes.  Patient aware.

## 2013-03-05 NOTE — Telephone Encounter (Signed)
New problem   Pt want to know his results and findings from his 12/31/12 ov for pacer ck. Please call pt

## 2013-08-27 ENCOUNTER — Ambulatory Visit (INDEPENDENT_AMBULATORY_CARE_PROVIDER_SITE_OTHER): Payer: Medicare Other | Admitting: Cardiology

## 2013-08-27 ENCOUNTER — Encounter: Payer: Self-pay | Admitting: Cardiology

## 2013-08-27 VITALS — BP 124/64 | HR 60 | Ht 70.0 in | Wt 169.8 lb

## 2013-08-27 DIAGNOSIS — I4892 Unspecified atrial flutter: Secondary | ICD-10-CM

## 2013-08-27 DIAGNOSIS — I5021 Acute systolic (congestive) heart failure: Secondary | ICD-10-CM

## 2013-08-27 DIAGNOSIS — I4891 Unspecified atrial fibrillation: Secondary | ICD-10-CM

## 2013-08-27 DIAGNOSIS — I442 Atrioventricular block, complete: Secondary | ICD-10-CM

## 2013-08-27 DIAGNOSIS — Z950 Presence of cardiac pacemaker: Secondary | ICD-10-CM

## 2013-08-27 LAB — MDC_IDC_ENUM_SESS_TYPE_INCLINIC
Battery Voltage: 2.78 V
Brady Statistic RA Percent Paced: 1.1 %
Brady Statistic RV Percent Paced: 99.1 %
Lead Channel Impedance Value: 460 Ohm
Lead Channel Pacing Threshold Amplitude: 0.75 V
Lead Channel Setting Pacing Amplitude: 2 V
Lead Channel Setting Sensing Sensitivity: 2 mV

## 2013-08-27 MED ORDER — FUROSEMIDE 40 MG PO TABS: 40.0000 mg | ORAL_TABLET | Freq: Two times a day (BID) | ORAL | Status: DC

## 2013-08-27 MED ORDER — POTASSIUM CHLORIDE CRYS ER 20 MEQ PO TBCR: 20.0000 meq | EXTENDED_RELEASE_TABLET | Freq: Two times a day (BID) | ORAL | Status: DC

## 2013-08-27 NOTE — Progress Notes (Signed)
ELECTROPHYSIOLOGY OFFICE NOTE  Patient ID: [name] [name] MRN: [id number], DOB/AGE: [birth year]   Date of Visit: 08/27/2013  Primary Physician: Ezequiel Kayser, MD Primary Cardiologist / Primary EP: Peter Swaziland, MD / Hillis Range, MD Reason for Visit: EP/device follow-up  History of Present Illness  [name] [name] is a 79 y.o. male with CAD, HTN, paroxysmal atrial fibrillation, complete heart block s/p pacemaker, GERD, COPD, rheumatoid arthritis, myasthenia gravis, chronic pain, alcohol abuse and cirrhosis. He presents today for routine electrophysiology followup.   Since last being seen in our clinic, he reports he is "having trouble with heart failure." He reports DOE, increase LE swelling and orthopnea for several weeks. He has also had PND on occasion. He has intermittent "sharp" CP. It occurs at rest and lasts only seconds. According to prior notes, he has a long history of atypical CP. He denies palpitations, dizziness, near syncope or syncope. He is compliant and tolerating medications without difficulty. He started Lasix with potassium supplementation 3 days and is seeing improvement.  Past Medical History Past Medical History  Diagnosis Date  . Complete heart block     s/p PPM  . PAF (paroxysmal atrial fibrillation)   . HTN (hypertension)   . HLD (hyperlipidemia)   . CAD (coronary artery disease)   . Depression   . Anxiety   . Hemorrhoids, external   . GERD (gastroesophageal reflux disease)   . Hyperplastic colonic polyp   . Diverticulosis of colon   . COPD (chronic obstructive pulmonary disease)   . Myasthenia gravis   . Arthritis, rheumatoid   . BPH (benign prostatic hypertrophy)   . Cirrhosis of liver   . Asthma   . Alcohol abuse   . Hiatal hernia   . Skin cancer of forehead     and left ear  . Lung nodule   . Nephrolithiasis   . CHF (congestive heart failure)     Past Surgical History Past Surgical History  Procedure Laterality Date  . Total knee arthroplasty       bilateral  . Total hip arthroplasty      x3  . Splenectomy    . Appendectomy    . Pacemaker insertion  2009    medtronic  . Cardiac catheterization  10/12/2007    EF 60%  . Inguinal hernia repair      Allergies/Intolerances Allergies  Allergen Reactions  . Remicade [Infliximab] Other (See Comments)    Almost died  . Alendronate Sodium Other (See Comments)    Unknown.  . Benazepril Other (See Comments)    Unknown.  . Celebrex [Celecoxib] Nausea And Vomiting    Current Home Medications Current Outpatient Prescriptions  Medication Sig Dispense Refill  . aspirin EC 81 MG tablet Take 81 mg by mouth daily.      Marland Kitchen azaTHIOprine (IMURAN) 50 MG tablet Take 75 mg by mouth 3 (three) times daily.        . budesonide-formoterol (SYMBICORT) 160-4.5 MCG/ACT inhaler Inhale 2 puffs into the lungs 2 (two) times daily.      Marland Kitchen docusate sodium (COLACE) 100 MG capsule Take 100 mg by mouth 2 (two) times daily as needed for constipation.      Marland Kitchen escitalopram (LEXAPRO) 10 MG tablet Take 10 mg by mouth daily.       Marland Kitchen esomeprazole (NEXIUM) 40 MG capsule Take 40 mg by mouth daily before breakfast.       . FIBER PO Take 1 tablet by mouth daily.      Marland Kitchen  morphine (MS CONTIN) 15 MG 12 hr tablet Take 15 mg by mouth as needed.       . Omega-3 Fatty Acids (FISH OIL) 1000 MG CAPS Take 1 capsule by mouth daily.      . ondansetron (ZOFRAN) 4 MG tablet Take 1 tablet (4 mg total) by mouth every 6 (six) hours.  12 tablet  0  . oxyCODONE-acetaminophen (PERCOCET/ROXICET) 5-325 MG per tablet Take 1 tablet by mouth every 4 (four) hours as needed for pain.      Marland Kitchen spironolactone (ALDACTONE) 50 MG tablet Take 25 mg by mouth daily.       . temazepam (RESTORIL) 15 MG capsule Take 15 mg by mouth daily. For sleep      . triamcinolone cream (KENALOG) 0.1 % Apply 1 application topically 2 (two) times daily.        No current facility-administered medications for this visit.    Social History Social History  . Marital  Status: Widowed   Social History Main Topics  . Smoking status: Former Smoker -- 40 years    Types: Cigars    Quit date: 10/22/1987  . Smokeless tobacco: Never Used  . Alcohol Use: 8.4 oz/week    14 Cans of beer per week  . Drug Use: No   Review of Systems General: No chills, fever, night sweats or weight changes Cardiovascular: No palpitations Dermatological: No rash, lesions or masses Respiratory: No cough Urologic: No hematuria, dysuria Abdominal: No nausea, vomiting, diarrhea, bright red blood per rectum, melena, or hematemesis Neurologic: No visual changes, weakness, changes in mental status All other systems reviewed and are otherwise negative except as noted above.  Physical Exam Vitals: Blood pressure 124/64, pulse 60, height 5\' 10"  (1.778 m), weight 169 lb 12.8 oz (77.021 kg), SpO2 96.00%.  General: Well developed, elderly appearing 79 y.o. male in no acute distress. HEENT: Normocephalic, atraumatic. EOMs intact. Sclera nonicteric. Oropharynx clear.  Neck: Supple. No JVD. Lungs: Respirations regular and unlabored, CTA bilaterally. No wheezes, rales or rhonchi. Heart: RRR. S1, S2 present. No murmurs, rub, S3 or S4. Abdomen: Soft, non-tender, non-distended. BS present x 4 quadrants. No hepatosplenomegaly.  Extremities: No clubbing or cyanosis. 2+ LE edema bilaterally. Venous stasis changes noted and well healing ulcer on LLE. PT/Radials 2+ and equal bilaterally. Psych: Normal affect. Neuro: Alert and oriented X 3. Moves all extremities spontaneously.   Diagnostics CXR report from [facility] - bilateral pulmonary edema Labs from [facility] 08/12/2013 sodium 135  potassium 5.1  chloride 102  bicarb 26  BUN 20  creatinine 0.7  AST 24  ALT13  alk phos 70 WBC 10,900  Hgb 12.1  Hct 36.2  Plt 433,000 TSH 1.97 Device interrogation - normal PPM function; battery voltage 2.78 with 8 years remaining longevity; pacemaker dependent, V paced 99% of time;  appears to be in AFib/AFlutter 100% of time (according to last interrogation March 2014 - in AFib/AFlutter 100% since Nov 2013 - Dr. Johney Frame aware); ventricular histograms reviewed and rate controlled; impedances stable; A sensing and V capture tested and stable; no changes made; see PaceArt report  Assessment and Plan 1. Acute systolic HF - will check 2D echo - increase Lasix to 40 mg twice daily along with K-dur 20 mEq twice daily for 5 days then resume once daily dosing - return to clinic for follow-up in one week 2. Atrial fibrillation / atrial flutter - it appears he has been in AFib/AFlutter 100% since Nov 2013 (as outlined above -  Dr. Johney Frame aware) - ventricular histograms show adequate rate control - not anticoagulated due to falls 3. Complete heart block s/p PPM - normal device function - no programming changes made today; however, if deemed permanent AFib/AFlutter, would reprogram to VVIR  Signed, Jaxsen Bernhart, PA-C 08/27/2013, 10:07 AM

## 2013-08-27 NOTE — Patient Instructions (Addendum)
Your physician has requested that you have an echocardiogram (POSSIBLELY BEFORE APPOINTMENT WITH [facility] ED[facility]ST[facility]). Echocardiography is a painless test that uses sound waves to create images of your heart. It provides your doctor with information about the size and shape of your heart and how well your heart's chambers and valves are working. This procedure takes approximately one hour. There are no restrictions for this procedure.  Your physician recommends that you schedule a follow-up appointment in: 1 WEEK WITH [facility] EDMIST[facility]  Your physician has recommended you make the following change in your medication:   INCREASE YOUR LASIX 40 MG TWICE A DAY (TWELVE HOURS APART) INCREASE YOUR POTASSIUM TWICE A DAY (TWELVE HOURS APART)

## 2013-09-01 ENCOUNTER — Ambulatory Visit (HOSPITAL_COMMUNITY): Payer: Medicare Other | Attending: Cardiology | Admitting: Cardiology

## 2013-09-01 ENCOUNTER — Encounter: Payer: Self-pay | Admitting: Cardiology

## 2013-09-01 DIAGNOSIS — I5021 Acute systolic (congestive) heart failure: Secondary | ICD-10-CM

## 2013-09-01 DIAGNOSIS — I2510 Atherosclerotic heart disease of native coronary artery without angina pectoris: Secondary | ICD-10-CM | POA: Insufficient documentation

## 2013-09-01 DIAGNOSIS — I059 Rheumatic mitral valve disease, unspecified: Secondary | ICD-10-CM | POA: Insufficient documentation

## 2013-09-01 DIAGNOSIS — I5020 Unspecified systolic (congestive) heart failure: Secondary | ICD-10-CM | POA: Insufficient documentation

## 2013-09-01 DIAGNOSIS — I4891 Unspecified atrial fibrillation: Secondary | ICD-10-CM

## 2013-09-01 DIAGNOSIS — E785 Hyperlipidemia, unspecified: Secondary | ICD-10-CM | POA: Insufficient documentation

## 2013-09-01 DIAGNOSIS — I442 Atrioventricular block, complete: Secondary | ICD-10-CM | POA: Insufficient documentation

## 2013-09-01 DIAGNOSIS — I10 Essential (primary) hypertension: Secondary | ICD-10-CM | POA: Insufficient documentation

## 2013-09-01 DIAGNOSIS — I509 Heart failure, unspecified: Secondary | ICD-10-CM | POA: Insufficient documentation

## 2013-09-01 DIAGNOSIS — I4892 Unspecified atrial flutter: Secondary | ICD-10-CM | POA: Insufficient documentation

## 2013-09-01 NOTE — Progress Notes (Signed)
Echo performed. 

## 2013-09-03 ENCOUNTER — Encounter: Payer: Self-pay | Admitting: Cardiology

## 2013-09-03 ENCOUNTER — Ambulatory Visit (INDEPENDENT_AMBULATORY_CARE_PROVIDER_SITE_OTHER): Payer: Medicare Other | Admitting: Cardiology

## 2013-09-03 VITALS — BP 143/75 | HR 76 | Ht 70.0 in | Wt 167.1 lb

## 2013-09-03 DIAGNOSIS — Z950 Presence of cardiac pacemaker: Secondary | ICD-10-CM

## 2013-09-03 DIAGNOSIS — I4891 Unspecified atrial fibrillation: Secondary | ICD-10-CM

## 2013-09-03 DIAGNOSIS — I519 Heart disease, unspecified: Secondary | ICD-10-CM

## 2013-09-03 DIAGNOSIS — I5021 Acute systolic (congestive) heart failure: Secondary | ICD-10-CM

## 2013-09-03 DIAGNOSIS — I442 Atrioventricular block, complete: Secondary | ICD-10-CM

## 2013-09-03 NOTE — Patient Instructions (Signed)
Your physician has requested that you have a lexiscan myoview (BEFORE APPOINTMENT WITH DR. Swaziland). Please follow instruction sheet, as given.  Your physician recommends that you schedule a follow-up appointment in: WITH DR. Swaziland IN 3 WEEKS (PER DR. Swaziland)  Your physician recommends that you continue on your current medications as directed. Please refer to the Current Medication list given to you today.  Your physician recommends that you return for lab work in: BMET TODAY

## 2013-09-06 ENCOUNTER — Other Ambulatory Visit: Payer: Medicare Other

## 2013-09-06 NOTE — Progress Notes (Signed)
CARDIOLOGY OFFICE NOTE  Patient ID: [name] [name] MRN: [id number], DOB/AGE: [birth year]   Date of Visit: 09/03/2013  Primary Physician: Ezequiel Kayser, MD Primary Cardiologist / Primary EP: Peter Swaziland, MD / Hillis Range, MD Reason for Visit: EP/device follow-up  History of Present Illness  [name] [name] is a 79 y.o. male with CAD, HTN, paroxysmal atrial fibrillation, complete heart block s/p pacemaker, GERD, COPD, rheumatoid arthritis, myasthenia gravis, chronic pain, alcohol abuse and cirrhosis. He presents today for one week followup. He was last seen by me one week ago. He reported DOE, increased LE swelling and orthopnea for several weeks. He also had PND on occasion. He reported intermittent "sharp" CP that occured at rest, lasting only seconds. According to prior notes, he has a long history of atypical CP. He denied palpitations, dizziness, near syncope or syncope.   He was found to have acute systolic HF. He was started on Lasix by his PCP two days prior. I increased his Lasix to twice daily dosing in addition to potassium supplementation and ordered an echo. He returns today for follow-up.  He reports he feels "great, back to normal" and has seen significant improvement in his DOE and LE swelling. Both have returned to baseline. He denies orthopnea or PND. He denies palpitations, dizziness, near syncope or syncope. He has stopped Lasix and reports he prefers to use Lasix only as needed due to frequent urination stating, "I don't want to be a prisoner in my home all the time."  Past Medical History Past Medical History  Diagnosis Date  . Complete heart block     s/p PPM  . PAF (paroxysmal atrial fibrillation)   . HTN (hypertension)   . HLD (hyperlipidemia)   . CAD (coronary artery disease)   . Depression   . Anxiety   . Hemorrhoids, external   . GERD (gastroesophageal reflux disease)   . Hyperplastic colonic polyp   . Diverticulosis of colon   . COPD (chronic obstructive  pulmonary disease)   . Myasthenia gravis   . Arthritis, rheumatoid   . BPH (benign prostatic hypertrophy)   . Cirrhosis of liver   . Asthma   . Alcohol abuse   . Hiatal hernia   . Skin cancer of forehead     and left ear  . Lung nodule   . Nephrolithiasis   . CHF (congestive heart failure)     Past Surgical History Past Surgical History  Procedure Laterality Date  . Total knee arthroplasty      bilateral  . Total hip arthroplasty      x3  . Splenectomy    . Appendectomy    . Pacemaker insertion  2009    medtronic  . Cardiac catheterization  10/12/2007    EF 60%  . Inguinal hernia repair      Allergies/Intolerances Allergies  Allergen Reactions  . Remicade [Infliximab] Other (See Comments)    Almost died  . Alendronate Sodium Other (See Comments)    Unknown.  . Benazepril Other (See Comments)    Unknown.  . Celebrex [Celecoxib] Nausea And Vomiting    Current Home Medications Current Outpatient Prescriptions  Medication Sig Dispense Refill  . aspirin EC 81 MG tablet Take 81 mg by mouth daily.      Marland Kitchen azaTHIOprine (IMURAN) 50 MG tablet Take 75 mg by mouth 3 (three) times daily.        . budesonide-formoterol (SYMBICORT) 160-4.5 MCG/ACT inhaler Inhale 2 puffs into the lungs 2 (two) times  daily.      . docusate sodium (COLACE) 100 MG capsule Take 100 mg by mouth 2 (two) times daily as needed for constipation.      Marland Kitchen escitalopram (LEXAPRO) 10 MG tablet Take 10 mg by mouth daily.       Marland Kitchen esomeprazole (NEXIUM) 40 MG capsule Take 40 mg by mouth daily before breakfast.       . FIBER PO Take 1 tablet by mouth daily.      Marland Kitchen morphine (MS CONTIN) 15 MG 12 hr tablet Take 15 mg by mouth as needed.       . Omega-3 Fatty Acids (FISH OIL) 1000 MG CAPS Take 1 capsule by mouth daily.      . ondansetron (ZOFRAN) 4 MG tablet Take 1 tablet (4 mg total) by mouth every 6 (six) hours.  12 tablet  0  . oxyCODONE-acetaminophen (PERCOCET/ROXICET) 5-325 MG per tablet Take 1 tablet by mouth  every 4 (four) hours as needed for pain.      . potassium chloride SA (KLOR-CON M20) 20 MEQ tablet Take 1 tablet (20 mEq total) by mouth 2 (two) times daily.  60 tablet  3  . spironolactone (ALDACTONE) 50 MG tablet Take 25 mg by mouth daily.       . temazepam (RESTORIL) 15 MG capsule Take 15 mg by mouth daily. For sleep      . triamcinolone cream (KENALOG) 0.1 % Apply 1 application topically 2 (two) times daily.        No current facility-administered medications for this visit.    Social History Social History  . Marital Status: Widowed   Social History Main Topics  . Smoking status: Former Smoker -- 40 years    Types: Cigars    Quit date: 10/22/1987  . Smokeless tobacco: Never Used  . Alcohol Use: 8.4 oz/week    14 Cans of beer per week  . Drug Use: No   Review of Systems General: No chills, fever, night sweats or weight changes Cardiovascular: No chest pain, dyspnea on exertion, edema, orthopnea, palpitations, paroxysmal nocturnal dyspnea Dermatological: No rash, lesions or masses Respiratory: No cough, dyspnea Urologic: No hematuria, dysuria Abdominal: No nausea, vomiting, diarrhea, bright red blood per rectum, melena, or hematemesis Neurologic: No visual changes, weakness, changes in mental status All other systems reviewed and are otherwise negative except as noted above.  Physical Exam Vitals: Blood pressure 143/75, pulse 76, height 5\' 10"  (1.778 m), weight 167 lb 1.9 oz (75.805 kg).  General: Well developed, elderly appearing 79 y.o. male in no acute distress.  HEENT: Normocephalic, atraumatic. EOMs intact. Sclera nonicteric. Oropharynx clear.  Neck: Supple. No JVD.  Lungs: Respirations regular and unlabored, CTA bilaterally. Few inspiratory wheezes. No rales or rhonchi.  Heart: RRR. S1, S2 present. No murmurs, rub, S3 or S4.  Abdomen: Soft, non-distended.  Extremities: No clubbing or cyanosis. 1+ LE edema bilaterally. Venous stasis changes noted and well healing ulcer  on LLE.  Psych: Normal affect.  Neuro: Alert and oriented X 3. Moves all extremities spontaneously.   Diagnostics Echocardiogram  Study Conclusions - Left ventricle: The cavity size was normal. There was mild concentric hypertrophy. Systolic function was mildly to moderately reduced. The estimated ejection fraction was in the range of 40% to 45%. Wall motion was normal; there were no regional wall motion abnormalities. Doppler parameters are consistent with a restrictive pattern, indicative of decreased left ventricular diastolic compliance and/or increased left atrial pressure (grade 3 diastolic dysfunction). - Mitral valve: Calcified  annulus. - Left atrium: The atrium was moderately dilated. - Right ventricle: The cavity size was moderately dilated. Systolic function was moderately reduced. - Right atrium: The atrium was moderately dilated. - Atrial septum: No defect or patent foramen ovale was identified. - Pulmonary arteries: Systolic pressure was moderately increased. PA peak pressure: 46mm Hg (S). - Pericardium, extracardiac: There was a left pleural effusion. Device interrogation performed last week - normal PPM function; battery voltage 2.78 with 8 years remaining longevity; pacemaker dependent, V paced 99% of time; appears to be in AFib/AFlutter 100% of time (according to last interrogation March 2014 - in AFib/AFlutter 100% since Nov 2013 - Dr. Johney Frame aware); ventricular histograms reviewed and rate controlled; impedances stable; A sensing and V capture tested and stable; no changes made; see PaceArt report   Assessment and Plan 1. Acute systolic HF  - EF 40-45% - discussed with his primary cardiologist, Dr. Swaziland, and formulated plan of care - discussed findings with [name] [name] and reviewed need for further ischemic evaluation - given his age and multiple co-morbidities, conservative management is preferred; therefore, will order Lexiscan Myoview stress test and reserve  cardiac cath for high risk Myoview  - will repeat BMET today to evaluate renal function and potassium after diuresis - [name] [name] refuses daily Lasix and states he will only take it as needed 2. Atrial fibrillation / atrial flutter  - it appears he has been in AFib/AFlutter 100% since Nov 2013 (as outlined above - Dr. Johney Frame aware)  - ventricular histograms show adequate rate control  - not anticoagulated due to falls  3. Complete heart block s/p PPM  - normal device function  - no programming changes made today; however, if deemed permanent AFib/AFlutter, would reprogram to VVIR  Signed, Hadlei Stitt, PA-C 09/06/2013, 12:03 AM

## 2013-09-08 ENCOUNTER — Other Ambulatory Visit: Payer: Medicare Other

## 2013-09-10 ENCOUNTER — Other Ambulatory Visit: Payer: Self-pay | Admitting: Internal Medicine

## 2013-09-23 ENCOUNTER — Encounter: Payer: Self-pay | Admitting: Cardiology

## 2013-09-23 ENCOUNTER — Ambulatory Visit (HOSPITAL_COMMUNITY): Payer: Medicare Other | Attending: Cardiology | Admitting: Radiology

## 2013-09-23 VITALS — BP 148/83 | HR 60 | Ht 70.0 in | Wt 171.0 lb

## 2013-09-23 DIAGNOSIS — I10 Essential (primary) hypertension: Secondary | ICD-10-CM | POA: Insufficient documentation

## 2013-09-23 DIAGNOSIS — R42 Dizziness and giddiness: Secondary | ICD-10-CM | POA: Insufficient documentation

## 2013-09-23 DIAGNOSIS — R0989 Other specified symptoms and signs involving the circulatory and respiratory systems: Secondary | ICD-10-CM | POA: Insufficient documentation

## 2013-09-23 DIAGNOSIS — E785 Hyperlipidemia, unspecified: Secondary | ICD-10-CM | POA: Insufficient documentation

## 2013-09-23 DIAGNOSIS — R0609 Other forms of dyspnea: Secondary | ICD-10-CM | POA: Insufficient documentation

## 2013-09-23 DIAGNOSIS — Z8249 Family history of ischemic heart disease and other diseases of the circulatory system: Secondary | ICD-10-CM | POA: Insufficient documentation

## 2013-09-23 DIAGNOSIS — R0602 Shortness of breath: Secondary | ICD-10-CM | POA: Insufficient documentation

## 2013-09-23 DIAGNOSIS — I252 Old myocardial infarction: Secondary | ICD-10-CM | POA: Insufficient documentation

## 2013-09-23 DIAGNOSIS — I2510 Atherosclerotic heart disease of native coronary artery without angina pectoris: Secondary | ICD-10-CM

## 2013-09-23 DIAGNOSIS — I4891 Unspecified atrial fibrillation: Secondary | ICD-10-CM | POA: Insufficient documentation

## 2013-09-23 DIAGNOSIS — I519 Heart disease, unspecified: Secondary | ICD-10-CM

## 2013-09-23 DIAGNOSIS — R5381 Other malaise: Secondary | ICD-10-CM | POA: Insufficient documentation

## 2013-09-23 DIAGNOSIS — I509 Heart failure, unspecified: Secondary | ICD-10-CM | POA: Insufficient documentation

## 2013-09-23 DIAGNOSIS — R062 Wheezing: Secondary | ICD-10-CM

## 2013-09-23 DIAGNOSIS — Z87891 Personal history of nicotine dependence: Secondary | ICD-10-CM | POA: Insufficient documentation

## 2013-09-23 DIAGNOSIS — R079 Chest pain, unspecified: Secondary | ICD-10-CM | POA: Insufficient documentation

## 2013-09-23 MED ORDER — ALBUTEROL SULFATE (5 MG/ML) 0.5% IN NEBU
5.0000 mg | INHALATION_SOLUTION | Freq: Once | RESPIRATORY_TRACT | Status: AC
  Administered 2013-09-23: 5 mg via RESPIRATORY_TRACT

## 2013-09-23 MED ORDER — REGADENOSON 0.4 MG/5ML IV SOLN
0.4000 mg | Freq: Once | INTRAVENOUS | Status: AC
  Administered 2013-09-23: 0.4 mg via INTRAVENOUS

## 2013-09-23 MED ORDER — AMINOPHYLLINE 25 MG/ML IV SOLN
75.0000 mg | Freq: Once | INTRAVENOUS | Status: AC
  Administered 2013-09-23: 75 mg via INTRAVENOUS

## 2013-09-23 MED ORDER — TECHNETIUM TC 99M SESTAMIBI GENERIC - CARDIOLITE
11.0000 | Freq: Once | INTRAVENOUS | Status: AC | PRN
  Administered 2013-09-23: 11 via INTRAVENOUS

## 2013-09-23 MED ORDER — TECHNETIUM TC 99M SESTAMIBI GENERIC - CARDIOLITE
33.0000 | Freq: Once | INTRAVENOUS | Status: AC | PRN
  Administered 2013-09-23: 33 via INTRAVENOUS

## 2013-09-23 NOTE — Progress Notes (Signed)
[facility] SITE 3 NUCLEAR MED [address] [place], [place] [address] [phone]    Cardiology Nuclear Med Study  [name] [name] is a 79 y.o. male     MRN : [id number]     DOB: 1928/12/23  Procedure Date: 09/23/2013  Nuclear Med Background Indication for Stress Test:  Evaluation for Ischemia History: CAD, MI, PTVP, PAF, CHF, and 09-01-2013 Echo: EF=40-45% Cardiac Risk Factors: Family History - CAD, History of Smoking, Hypertension and Lipids  Symptoms: Chest Pain without exertion (last occurrence last night), Dizziness, DOE, Fatigue, and SOB   Nuclear Pre-Procedure Caffeine/Decaff Intake:  None > 12 hrs NPO After: 11:30pm   Lungs:  Minimal expiratory wheezing prior to Lexiscan, O2 Sat: 97% on 2 liters/min via nasal cannula. IV 0.9% NS with Angio Cath:  20g  IV Site: R Antecubital x 1, tolerated well IV Started by:  Irean Hong, RN  Chest Size (in):  38 Cup Size: n/a  Height: 5\' 10"  (1.778 m)  Weight:  171 lb (77.565 kg)  BMI:  Body mass index is 24.54 kg/(m^2). Tech Comments: Lung fields assessed on arrival with expiratory wheezing, O2 Sat 93-98% RA after deep breaths, HR 62. Nebulizer treatment with albuterol 5 mg solution given via mask with 8L oxygen, tolerated well with improvement of expiratory wheezing.Irean Hong, RN.     Nuclear Med Study 1 or 2 day study: 1 day  Stress Test Type:  Lexiscan  Reading MD: Tobias Alexander, MD  Order Authorizing Provider:  Peter Swaziland, MD, and Rick Duff, [facility]  Resting Radionuclide: Technetium 85m Sestamibi  Resting Radionuclide Dose: 11.0 mCi   Stress Radionuclide:  Technetium 9m Sestamibi  Stress Radionuclide Dose: 33.0 mCi           Stress Protocol Rest HR: 60 Stress HR: 60  Rest BP: 148/83 Stress BP: 151/72  Exercise Time (min): n/a METS: n/a   Predicted Max HR: 138 bpm % Max HR: 43.48 bpm Rate Pressure Product: 9060   Dose of Adenosine (mg):  n/a Dose of Lexiscan: 0.4 mg  Dose of Atropine (mg): n/a  Dose of Dobutamine: n/a mcg/kg/min (at max HR)  Stress Test Technologist: Irean Hong, RN  Nuclear Technologist:  Domenic Polite, CNMT     Rest Procedure:  Myocardial perfusion imaging was performed at rest 45 minutes following the intravenous administration of Technetium 80m Sestamibi. Rest ECG: NSR-LBBB  Stress Procedure:  The patient received IV Lexiscan 0.4 mg over 15-seconds.  Technetium 7m Sestamibi injected at 30-seconds.  The patient complained of chest pressure and SOB with Lexiscan. Aminophylline 75 mg IVP given at 6 minutes in recovery due to persistent chest tightness, and SOB with quick resolution of symptoms.  Quantitative spect images were obtained after a 45 minute delay. Stress ECG: No significant change from baseline ECG  QPS Raw Data Images:  Normal; no motion artifact; normal heart/lung ratio. Stress Images:  Normal homogeneous uptake in all areas of the myocardium. Rest Images:  Normal homogeneous uptake in all areas of the myocardium. Subtraction (SDS):  No evidence of ischemia. Transient Ischemic Dilatation (Normal <1.22):  1.07 Lung/Heart Ratio (Normal <0.45):  0.34  Quantitative Gated Spect Images QGS EDV:  150 ml QGS ESV:  78 ml  Impression Exercise Capacity:  Lexiscan with no exercise. BP Response:  Normal blood pressure response. Clinical Symptoms:  There is dyspnea. ECG Impression:  No significant ST segment change suggestive of ischemia. Comparison with Prior Nuclear Study: No previous nuclear study performed  Overall Impression:  Normal stress nuclear study.  LV Ejection Fraction: 48%.  LV Wall Motion:  Paradoxical septal motion.  Tobias Alexander, H 09/23/2013

## 2013-09-27 ENCOUNTER — Encounter: Payer: Self-pay | Admitting: Nurse Practitioner

## 2013-09-27 ENCOUNTER — Ambulatory Visit (INDEPENDENT_AMBULATORY_CARE_PROVIDER_SITE_OTHER): Payer: Medicare Other | Admitting: Nurse Practitioner

## 2013-09-27 VITALS — BP 140/80 | HR 66 | Ht 70.0 in | Wt 173.1 lb

## 2013-09-27 DIAGNOSIS — R0600 Dyspnea, unspecified: Secondary | ICD-10-CM

## 2013-09-27 DIAGNOSIS — I5022 Chronic systolic (congestive) heart failure: Secondary | ICD-10-CM

## 2013-09-27 DIAGNOSIS — R0609 Other forms of dyspnea: Secondary | ICD-10-CM

## 2013-09-27 DIAGNOSIS — I519 Heart disease, unspecified: Secondary | ICD-10-CM

## 2013-09-27 LAB — CBC WITH DIFFERENTIAL/PLATELET
Basophils Absolute: 0 10*3/uL (ref 0.0–0.1)
Basophils Relative: 0.2 % (ref 0.0–3.0)
Eosinophils Absolute: 0.2 10*3/uL (ref 0.0–0.7)
Eosinophils Relative: 3.5 % (ref 0.0–5.0)
HCT: 30.9 % — ABNORMAL LOW (ref 39.0–52.0)
Hemoglobin: 10.1 g/dL — ABNORMAL LOW (ref 13.0–17.0)
Lymphocytes Relative: 8.1 % — ABNORMAL LOW (ref 12.0–46.0)
Lymphs Abs: 0.5 10*3/uL — ABNORMAL LOW (ref 0.7–4.0)
MCHC: 32.7 g/dL (ref 30.0–36.0)
MCV: 101.4 fl — ABNORMAL HIGH (ref 78.0–100.0)
Monocytes Absolute: 1.1 10*3/uL — ABNORMAL HIGH (ref 0.1–1.0)
Monocytes Relative: 18.1 % — ABNORMAL HIGH (ref 3.0–12.0)
Neutro Abs: 4.2 10*3/uL (ref 1.4–7.7)
Neutrophils Relative %: 70.1 % (ref 43.0–77.0)
Platelets: 279 10*3/uL (ref 150.0–400.0)
RBC: 3.05 Mil/uL — ABNORMAL LOW (ref 4.22–5.81)
RDW: 16.6 % — ABNORMAL HIGH (ref 11.5–14.6)
WBC: 6 10*3/uL (ref 4.5–10.5)

## 2013-09-27 LAB — HEPATIC FUNCTION PANEL
ALT: 16 U/L (ref 0–53)
AST: 27 U/L (ref 0–37)
Albumin: 3.6 g/dL (ref 3.5–5.2)
Alkaline Phosphatase: 73 U/L (ref 39–117)
Bilirubin, Direct: 0.1 mg/dL (ref 0.0–0.3)
Total Bilirubin: 0.6 mg/dL (ref 0.3–1.2)
Total Protein: 7.3 g/dL (ref 6.0–8.3)

## 2013-09-27 LAB — BASIC METABOLIC PANEL
BUN: 14 mg/dL (ref 6–23)
CO2: 28 mEq/L (ref 19–32)
Calcium: 8.5 mg/dL (ref 8.4–10.5)
Chloride: 101 mEq/L (ref 96–112)
Creatinine, Ser: 0.6 mg/dL (ref 0.4–1.5)
GFR: 139.76 mL/min (ref >60.00)
Glucose, Bld: 101 mg/dL — ABNORMAL HIGH (ref 70–99)
Potassium: 4.6 mEq/L (ref 3.5–5.1)
Sodium: 135 mEq/L (ref 135–145)

## 2013-09-27 LAB — BRAIN NATRIURETIC PEPTIDE: Pro B Natriuretic peptide (BNP): 584 pg/mL — ABNORMAL HIGH (ref 0.0–100.0)

## 2013-09-27 MED ORDER — FUROSEMIDE 40 MG PO TABS: 20.0000 mg | ORAL_TABLET | Freq: Every day | ORAL | Status: DC

## 2013-09-27 MED ORDER — POTASSIUM CHLORIDE CRYS ER 20 MEQ PO TBCR: 20.0000 meq | EXTENDED_RELEASE_TABLET | Freq: Once | ORAL | Status: DC

## 2013-09-27 NOTE — Patient Instructions (Addendum)
Try to minimize your salt use as much as possible  Try to keep cutting back on your alcohol use  Stay on your current medicines and add back the Furosemide 40 mg taking just a half a pill each morning along with just one potassium pill  We will check labs today  See me and Dr. Swaziland back in a month  Call the [facility] office at [phone] if you have any questions, problems or concerns.

## 2013-09-27 NOTE — Progress Notes (Signed)
Baxter Kail Date of Birth: [birth year] Medical Record #[id number]  History of Present Illness: Mr. Barthold is seen back today for a follow up visit. Seen for Dr. Swaziland. Former patient of Dr. Silva Bandy. He has CAD - managed medically, HTN, PAF but probably chronic AF, CHB with PPM in place, GERD, COPD, RA, myasthenia gravis, chronic pain, alcohol abuse and cirrhosis.   Most recently with acute systolic HF - started on Lasix by his PCP - had an echo and a Myoview. EF is 40 to 45%. He refuse to take daily Lasix. Also recently noted that he has had AF/flutter since November of 2013 - no on anticoagulated due to falls.   Comes back today. Here alone. Breathing remains quite hard, especially with exertion. No real chest pain. Still with swelling - extends into his scrotum. Remains VERY reluctant to take any medicines. Has only used the lasix 2 times since last here - it did help but he has such urinary frequency that he will not take every day.   Current Outpatient Prescriptions  Medication Sig Dispense Refill  . aspirin EC 81 MG tablet Take 81 mg by mouth daily.      Marland Kitchen azaTHIOprine (IMURAN) 50 MG tablet Take 75 mg by mouth 3 (three) times daily.        . budesonide-formoterol (SYMBICORT) 160-4.5 MCG/ACT inhaler Inhale 2 puffs into the lungs 2 (two) times daily.      Marland Kitchen docusate sodium (COLACE) 100 MG capsule Take 100 mg by mouth 2 (two) times daily as needed for constipation.      Marland Kitchen escitalopram (LEXAPRO) 10 MG tablet Take 10 mg by mouth daily.       Marland Kitchen esomeprazole (NEXIUM) 40 MG capsule Take 40 mg by mouth daily before breakfast.       . FIBER PO Take 1 tablet by mouth daily.      Marland Kitchen morphine (MS CONTIN) 15 MG 12 hr tablet Take 15 mg by mouth as needed.       . mupirocin ointment (BACTROBAN) 2 % Apply 1 application topically daily.       . Omega-3 Fatty Acids (FISH OIL) 1000 MG CAPS Take 1 capsule by mouth daily.      . ondansetron (ZOFRAN) 4 MG tablet Take 1 tablet (4 mg total) by mouth every 6  (six) hours.  12 tablet  0  . oxyCODONE-acetaminophen (PERCOCET/ROXICET) 5-325 MG per tablet Take 1 tablet by mouth every 4 (four) hours as needed for pain.      . potassium chloride SA (KLOR-CON M20) 20 MEQ tablet Take 1 tablet (20 mEq total) by mouth 2 (two) times daily.  60 tablet  3  . spironolactone (ALDACTONE) 50 MG tablet Take 25 mg by mouth daily.       . temazepam (RESTORIL) 15 MG capsule Take 15 mg by mouth daily. For sleep      . triamcinolone cream (KENALOG) 0.1 % Apply 1 application topically 2 (two) times daily.        No current facility-administered medications for this visit.    Allergies  Allergen Reactions  . Remicade [Infliximab] Other (See Comments)    Almost died  . Alendronate Sodium Other (See Comments)    Unknown.  . Benazepril Other (See Comments)    Unknown.  . Celebrex [Celecoxib] Nausea And Vomiting    Past Medical History  Diagnosis Date  . Complete heart block     s/p PPM  . PAF (paroxysmal atrial fibrillation)   .  HTN (hypertension)   . HLD (hyperlipidemia)   . CAD (coronary artery disease)   . Depression   . Anxiety   . Hemorrhoids, external   . GERD (gastroesophageal reflux disease)   . Hyperplastic colonic polyp   . Diverticulosis of colon   . COPD (chronic obstructive pulmonary disease)   . Myasthenia gravis   . Arthritis, rheumatoid   . BPH (benign prostatic hypertrophy)   . Cirrhosis of liver   . Asthma   . Alcohol abuse   . Hiatal hernia   . Skin cancer of forehead     and left ear  . Lung nodule   . Nephrolithiasis   . CHF (congestive heart failure)     Past Surgical History  Procedure Laterality Date  . Total knee arthroplasty      bilateral  . Total hip arthroplasty      x3  . Splenectomy    . Appendectomy    . Pacemaker insertion  2009    medtronic  . Cardiac catheterization  10/12/2007    EF 60%  . Inguinal hernia repair      History  Smoking status  . Former Smoker -- 40 years  . Types: Cigars  . Quit  date: 10/22/1987  Smokeless tobacco  . Never Used    History  Alcohol Use  . 8.4 oz/week  . 14 Cans of beer per week    Family History  Problem Relation Age of Onset  . Myasthenia gravis Mother   . Esophageal cancer Father   . HIV Son     Aids  . Diabetes Son     Review of Systems: The review of systems is per the HPI.  All other systems were reviewed and are negative.  Physical Exam: Pulse 66  Ht 5\' 10"  (1.778 m)  Wt 173 lb 1.9 oz (78.527 kg)  BMI 24.84 kg/m2  SpO2 98% Patient is very pleasant and in no acute distress. Skin is warm and dry. Quite weathered. Color is normal.  HEENT is unremarkable. Normocephalic/atraumatic. PERRL. Sclera are nonicteric. Neck is supple. No masses. No JVD. Lungs are coarse. Cardiac exam shows a fairly regular rate and rhythm - presumed paced. Abdomen is soft. Extremities are quite full with swelling extending up to his abdomen. Gait is a little unsteady but ROM is intact. No gross neurologic deficits noted.  Wt Readings from Last 3 Encounters:  09/27/13 173 lb 1.9 oz (78.527 kg)  09/23/13 171 lb (77.565 kg)  09/03/13 167 lb 1.9 oz (75.805 kg)     LABORATORY DATA: PENDING     Chemistry      Component Value Date/Time   NA 135 02/05/2013 1600   K 4.9 02/05/2013 1600   CL 101 02/05/2013 1600   CO2 26 02/05/2013 1600   BUN 20 02/05/2013 1600   CREATININE 0.51 02/05/2013 1600      Component Value Date/Time   CALCIUM 9.1 02/05/2013 1600   ALKPHOS 84 02/05/2013 1600   AST 43* 02/05/2013 1600   ALT 19 02/05/2013 1600   BILITOT 1.6* 02/05/2013 1600     Lab Results  Component Value Date   WBC 8.2 02/05/2013   HGB 13.0 02/05/2013   HCT 36.2* 02/05/2013   MCV 99.2 02/05/2013   PLT 262 02/05/2013    Myoview Impression  Exercise Capacity: Lexiscan with no exercise.  BP Response: Normal blood pressure response.  Clinical Symptoms: There is dyspnea.  ECG Impression: No significant ST segment change suggestive of ischemia.  Comparison with Prior  Nuclear Study: No previous nuclear study performed  Overall Impression: Normal stress nuclear study.  LV Ejection Fraction: 48%. LV Wall Motion: Paradoxical septal motion.  Tobias Alexander, H  09/23/2013   Assessment / Plan: 1. Systolic HF - EF of 40 to 45% per echo and 48% per Myoview - no ischemia on Myoview - trying to manage medically. He remains very reluctant to take medicines. I think I have him agreeing to try just 20 mg of Lasix a day with one potassium pill per day. I think his urinary frequency (that he has even without any diuretic therapy is probably more a reflection of his prostate).  He admits to lots of excessive swelling due to his social situation. Overall prognosis looks poor.   2. Probably chronic atrial fib - not a candidate for anticoagulation.  3. CHB - s/p PPM - followed by Dr. Johney Frame - consider changing to VVIR on return check  4. CAD - managed medically  We will see him back in a month. Try to diurese as much as he will let us. Unfortunately, I do not see this going well for him. Recheck labs today.   Patient is agreeable to this plan and will call if any problems develop in the interim.   Rosalio Macadamia, RN, ANP-C [facility] [address] [place], [place]  [address]

## 2013-10-04 ENCOUNTER — Encounter: Payer: Self-pay | Admitting: Internal Medicine

## 2013-10-25 ENCOUNTER — Ambulatory Visit (INDEPENDENT_AMBULATORY_CARE_PROVIDER_SITE_OTHER): Payer: Medicare Other | Admitting: Nurse Practitioner

## 2013-10-25 ENCOUNTER — Encounter: Payer: Self-pay | Admitting: Nurse Practitioner

## 2013-10-25 VITALS — BP 130/60 | HR 64 | Ht 70.0 in | Wt 166.1 lb

## 2013-10-25 DIAGNOSIS — I5022 Chronic systolic (congestive) heart failure: Secondary | ICD-10-CM

## 2013-10-25 NOTE — Patient Instructions (Signed)
Try to take your Furosemide more often - ideally take every day - this will help with the swelling and your breathing  See Dr. Martinique in 2 months  Call the [place] office at [phone] if you have any questions, problems or concerns.

## 2013-10-25 NOTE — Progress Notes (Signed)
[name] [name] Date of Birth: 04-05-1929 Medical Record #[id number]  History of Present Illness: [name] is seen back today for a one month check. Seen for Dr. Martinique. Former patient of Dr. Janyth Pupa.  He has CAD - managed medically, HTN, PAF but probably chronic AF, CHB with PPM in place, GERD, COPD, RA, myasthenia gravis, chronic pain, alcohol abuse and cirrhosis.   Most recently with acute systolic HF - started on Lasix by his PCP - had an echo and a Myoview. EF is 40 to 45%. Myoview was ok. He refused to take daily Lasix. Also recently noted that he has had AF/flutter since November of 2013 - not on anticoagulation due to falls.   Seen a month ago - was still having swelling - extended into his scrotum. Remained VERY reluctant to take any medicines. Had only used the lasix 2 times since last here - it did help but he has such urinary frequency that he will not take every day. Tried to get him to take just 20 mg of Lasix daily.   Comes back today. Here alone. Subsequently seen with Dr. Martinique. Remains short of breath and still with considerable swelling in his legs/scrotum. Weight is down a few pounds. Has only been taking the lasix sporadically. Limited by hip pain - on chronic narcotics.   Current Outpatient Prescriptions  Medication Sig Dispense Refill  . aspirin EC 81 MG tablet Take 81 mg by mouth daily.      Marland Kitchen azaTHIOprine (IMURAN) 50 MG tablet Take 75 mg by mouth 3 (three) times daily.        . budesonide-formoterol (SYMBICORT) 160-4.5 MCG/ACT inhaler Inhale 2 puffs into the lungs 2 (two) times daily.      Marland Kitchen docusate sodium (COLACE) 100 MG capsule Take 100 mg by mouth 2 (two) times daily as needed for constipation.      Marland Kitchen escitalopram (LEXAPRO) 10 MG tablet Take 10 mg by mouth daily.       Marland Kitchen esomeprazole (NEXIUM) 40 MG capsule Take 40 mg by mouth daily before breakfast.       . FIBER PO Take 1 tablet by mouth daily.      . furosemide (LASIX) 40 MG tablet Take 20 mg by mouth as  needed.      Marland Kitchen morphine (MS CONTIN) 15 MG 12 hr tablet Take 15 mg by mouth as needed.       . mupirocin ointment (BACTROBAN) 2 % Apply 1 application topically daily.       . Omega-3 Fatty Acids (FISH OIL) 1000 MG CAPS Take 1 capsule by mouth daily.      Marland Kitchen oxyCODONE-acetaminophen (PERCOCET/ROXICET) 5-325 MG per tablet Take 1 tablet by mouth every 4 (four) hours as needed for pain.      . potassium chloride SA (K-DUR,KLOR-CON) 20 MEQ tablet Take 20 mEq by mouth as needed.      Marland Kitchen spironolactone (ALDACTONE) 50 MG tablet Take 25 mg by mouth daily.       . temazepam (RESTORIL) 15 MG capsule Take 15 mg by mouth daily. For sleep      . triamcinolone cream (KENALOG) 0.1 % Apply 1 application topically 2 (two) times daily.        No current facility-administered medications for this visit.    Allergies  Allergen Reactions  . Remicade [Infliximab] Other (See Comments)    Almost died  . Alendronate Sodium Other (See Comments)    Unknown.  . Benazepril Other (See Comments)  Unknown.  . Celebrex [Celecoxib] Nausea And Vomiting    Past Medical History  Diagnosis Date  . Complete heart block     s/p PPM  . PAF (paroxysmal atrial fibrillation)   . HTN (hypertension)   . HLD (hyperlipidemia)   . CAD (coronary artery disease)   . Depression   . Anxiety   . Hemorrhoids, external   . GERD (gastroesophageal reflux disease)   . Hyperplastic colonic polyp   . Diverticulosis of colon   . COPD (chronic obstructive pulmonary disease)   . Myasthenia gravis   . Arthritis, rheumatoid   . BPH (benign prostatic hypertrophy)   . Cirrhosis of liver   . Asthma   . Alcohol abuse   . Hiatal hernia   . Skin cancer of forehead     and left ear  . Lung nodule   . Nephrolithiasis   . CHF (congestive heart failure)     Past Surgical History  Procedure Laterality Date  . Total knee arthroplasty      bilateral  . Total hip arthroplasty      x3  . Splenectomy    . Appendectomy    . Pacemaker  insertion  2009    medtronic  . Cardiac catheterization  10/12/2007    EF 60%  . Inguinal hernia repair      History  Smoking status  . Former Smoker -- 24 years  . Types: Cigars  . Quit date: 10/22/1987  Smokeless tobacco  . Never Used    History  Alcohol Use  . 8.4 oz/week  . 14 Cans of beer per week    Family History  Problem Relation Age of Onset  . Myasthenia gravis Mother   . Esophageal cancer Father   . HIV Son     Aids  . Diabetes Son     Review of Systems: The review of systems is per the HPI.  All other systems were reviewed and are negative.  Physical Exam: BP 130/60  Pulse 64  Ht 5\' 10"  (1.778 m)  Wt 166 lb 1.9 oz (75.352 kg)  BMI 23.84 kg/m2 Patient is alert and in no acute distress. Skin is quite weathered. Color is normal.  HEENT is unremarkable. Normocephalic/atraumatic. PERRL. Sclera are nonicteric. Neck is supple. No masses. No JVD. Lungs are clear. Cardiac exam shows a fairly regular rate and rhythm (presumed paced). Abdomen is soft. Extremities are quite hard with considerable edema - looks unchanged. Gait and ROM are intact. No gross neurologic deficits noted.  Wt Readings from Last 3 Encounters:  10/25/13 166 lb 1.9 oz (75.352 kg)  09/27/13 173 lb 1.9 oz (78.527 kg)  09/23/13 171 lb (77.565 kg)     LABORATORY DATA:    Chemistry      Component Value Date/Time   NA 135 09/27/2013 1541   K 4.6 09/27/2013 1541   CL 101 09/27/2013 1541   CO2 28 09/27/2013 1541   BUN 14 09/27/2013 1541   CREATININE 0.6 09/27/2013 1541      Component Value Date/Time   CALCIUM 8.5 09/27/2013 1541   ALKPHOS 73 09/27/2013 1541   AST 27 09/27/2013 1541   ALT 16 09/27/2013 1541   BILITOT 0.6 09/27/2013 1541     Lab Results  Component Value Date   WBC 6.0 09/27/2013   HGB 10.1* 09/27/2013   HCT 30.9* 09/27/2013   MCV 101.4* 09/27/2013   PLT 279.0 09/27/2013   No results found for this basename: CHOL,  HDL,  LDLCALC,  LDLDIRECT,  TRIG,  CHOLHDL   Myoview  Impression from December 2014  Exercise Capacity: Beason with no exercise.  BP Response: Normal blood pressure response.  Clinical Symptoms: There is dyspnea.  ECG Impression: No significant ST segment change suggestive of ischemia.  Comparison with Prior Nuclear Study: No previous nuclear study performed  Overall Impression: Normal stress nuclear study.  LV Ejection Fraction: 48%. LV Wall Motion: Paradoxical septal motion.  Ena Dawley, H  09/23/2013  Echo Study Conclusions from November 2014  - Left ventricle: The cavity size was normal. There was mild concentric hypertrophy. Systolic function was mildly to moderately reduced. The estimated ejection fraction was in the range of 40% to 45%. Wall motion was normal; there were no regional wall motion abnormalities. Doppler parameters are consistent with a restrictive pattern, indicative of decreased left ventricular diastolic compliance and/or increased left atrial pressure (grade 3 diastolic dysfunction). - Mitral valve: Calcified annulus. - Left atrium: The atrium was moderately dilated. - Right ventricle: The cavity size was moderately dilated. Systolic function was moderately reduced. - Right atrium: The atrium was moderately dilated. - Atrial septum: No defect or patent foramen ovale was identified. - Pulmonary arteries: Systolic pressure was moderately increased. PA peak pressure: 59mm Hg (S). - Pericardium, extracardiac: There was a left pleural effusion.   Assessment / Plan: 1. Systolic HF - EF of 40 to 45% per echo and 48% per Myoview - no ischemia on Myoview - trying to manage medically. He remains very reluctant to take medicines. Subsequently seen with Dr. Martinique - we have both tried to stress the importance of taking his diuretics on a more regular basis - ideally, every day.   2. Probable chronic atrial fib - not a candidate for anticoagulation.   3. CHB - s/p PPM - followed by Dr. Rayann Heman - consider changing  to VVIR on return check   4. CAD - managed medically   We will see him back in a couple of months. Continue to try to diurese as much as he will let us. Unfortunately, I do not see this going well for him.   Patient is agreeable to this plan and will call if any problems develop in the interim.   Burtis Junes, RN, [facility]  [address] [place]  [place], [place] [address]

## 2013-11-03 ENCOUNTER — Inpatient Hospital Stay (HOSPITAL_COMMUNITY)
Admission: EM | Admit: 2013-11-03 | Discharge: 2013-11-07 | DRG: 291 | Disposition: A | Discharge status: Home / Self Care | Payer: Medicare Other | Attending: Internal Medicine | Admitting: Internal Medicine

## 2013-11-03 ENCOUNTER — Encounter (HOSPITAL_COMMUNITY): Payer: Self-pay | Admitting: Emergency Medicine

## 2013-11-03 ENCOUNTER — Observation Stay (HOSPITAL_COMMUNITY): Payer: Medicare Other

## 2013-11-03 ENCOUNTER — Emergency Department (HOSPITAL_COMMUNITY): Payer: Medicare Other

## 2013-11-03 DIAGNOSIS — J9819 Other pulmonary collapse: Secondary | ICD-10-CM | POA: Diagnosis present

## 2013-11-03 DIAGNOSIS — F329 Major depressive disorder, single episode, unspecified: Secondary | ICD-10-CM | POA: Diagnosis present

## 2013-11-03 DIAGNOSIS — E785 Hyperlipidemia, unspecified: Secondary | ICD-10-CM | POA: Diagnosis present

## 2013-11-03 DIAGNOSIS — R0609 Other forms of dyspnea: Secondary | ICD-10-CM

## 2013-11-03 DIAGNOSIS — I509 Heart failure, unspecified: Secondary | ICD-10-CM

## 2013-11-03 DIAGNOSIS — F3289 Other specified depressive episodes: Secondary | ICD-10-CM

## 2013-11-03 DIAGNOSIS — Z87891 Personal history of nicotine dependence: Secondary | ICD-10-CM

## 2013-11-03 DIAGNOSIS — I10 Essential (primary) hypertension: Secondary | ICD-10-CM

## 2013-11-03 DIAGNOSIS — IMO0002 Reserved for concepts with insufficient information to code with codable children: Secondary | ICD-10-CM

## 2013-11-03 DIAGNOSIS — Z950 Presence of cardiac pacemaker: Secondary | ICD-10-CM

## 2013-11-03 DIAGNOSIS — Z96659 Presence of unspecified artificial knee joint: Secondary | ICD-10-CM

## 2013-11-03 DIAGNOSIS — Z91199 Patient's noncompliance with other medical treatment and regimen due to unspecified reason: Secondary | ICD-10-CM

## 2013-11-03 DIAGNOSIS — M069 Rheumatoid arthritis, unspecified: Secondary | ICD-10-CM

## 2013-11-03 DIAGNOSIS — J40 Bronchitis, not specified as acute or chronic: Secondary | ICD-10-CM

## 2013-11-03 DIAGNOSIS — N400 Enlarged prostate without lower urinary tract symptoms: Secondary | ICD-10-CM | POA: Diagnosis present

## 2013-11-03 DIAGNOSIS — J45901 Unspecified asthma with (acute) exacerbation: Secondary | ICD-10-CM

## 2013-11-03 DIAGNOSIS — K219 Gastro-esophageal reflux disease without esophagitis: Secondary | ICD-10-CM

## 2013-11-03 DIAGNOSIS — Z79899 Other long term (current) drug therapy: Secondary | ICD-10-CM

## 2013-11-03 DIAGNOSIS — I5043 Acute on chronic combined systolic (congestive) and diastolic (congestive) heart failure: Principal | ICD-10-CM | POA: Diagnosis present

## 2013-11-03 DIAGNOSIS — R0602 Shortness of breath: Secondary | ICD-10-CM

## 2013-11-03 DIAGNOSIS — I442 Atrioventricular block, complete: Secondary | ICD-10-CM

## 2013-11-03 DIAGNOSIS — J441 Chronic obstructive pulmonary disease with (acute) exacerbation: Secondary | ICD-10-CM

## 2013-11-03 DIAGNOSIS — K7030 Alcoholic cirrhosis of liver without ascites: Secondary | ICD-10-CM | POA: Diagnosis present

## 2013-11-03 DIAGNOSIS — F32A Depression, unspecified: Secondary | ICD-10-CM | POA: Diagnosis present

## 2013-11-03 DIAGNOSIS — J189 Pneumonia, unspecified organism: Secondary | ICD-10-CM

## 2013-11-03 DIAGNOSIS — G7000 Myasthenia gravis without (acute) exacerbation: Secondary | ICD-10-CM | POA: Diagnosis present

## 2013-11-03 DIAGNOSIS — J9600 Acute respiratory failure, unspecified whether with hypoxia or hypercapnia: Secondary | ICD-10-CM | POA: Diagnosis present

## 2013-11-03 DIAGNOSIS — F411 Generalized anxiety disorder: Secondary | ICD-10-CM | POA: Diagnosis present

## 2013-11-03 DIAGNOSIS — Z800 Family history of malignant neoplasm of digestive organs: Secondary | ICD-10-CM

## 2013-11-03 DIAGNOSIS — Z96649 Presence of unspecified artificial hip joint: Secondary | ICD-10-CM

## 2013-11-03 DIAGNOSIS — Z833 Family history of diabetes mellitus: Secondary | ICD-10-CM

## 2013-11-03 DIAGNOSIS — Z85828 Personal history of other malignant neoplasm of skin: Secondary | ICD-10-CM

## 2013-11-03 DIAGNOSIS — R64 Cachexia: Secondary | ICD-10-CM | POA: Diagnosis present

## 2013-11-03 DIAGNOSIS — I2510 Atherosclerotic heart disease of native coronary artery without angina pectoris: Secondary | ICD-10-CM | POA: Diagnosis present

## 2013-11-03 DIAGNOSIS — R0600 Dyspnea, unspecified: Secondary | ICD-10-CM | POA: Diagnosis present

## 2013-11-03 DIAGNOSIS — R0989 Other specified symptoms and signs involving the circulatory and respiratory systems: Secondary | ICD-10-CM

## 2013-11-03 DIAGNOSIS — Z888 Allergy status to other drugs, medicaments and biological substances status: Secondary | ICD-10-CM

## 2013-11-03 DIAGNOSIS — F1010 Alcohol abuse, uncomplicated: Secondary | ICD-10-CM

## 2013-11-03 DIAGNOSIS — J449 Chronic obstructive pulmonary disease, unspecified: Secondary | ICD-10-CM

## 2013-11-03 DIAGNOSIS — I4891 Unspecified atrial fibrillation: Secondary | ICD-10-CM

## 2013-11-03 DIAGNOSIS — Z9181 History of falling: Secondary | ICD-10-CM

## 2013-11-03 DIAGNOSIS — Z7982 Long term (current) use of aspirin: Secondary | ICD-10-CM

## 2013-11-03 DIAGNOSIS — Z9119 Patient's noncompliance with other medical treatment and regimen: Secondary | ICD-10-CM

## 2013-11-03 HISTORY — DX: Presence of cardiac pacemaker: Z95.0

## 2013-11-03 LAB — BASIC METABOLIC PANEL
BUN: 14 mg/dL (ref 6–23)
CHLORIDE: 97 meq/L (ref 96–112)
CO2: 27 meq/L (ref 19–32)
Calcium: 8.9 mg/dL (ref 8.4–10.5)
Creatinine, Ser: 0.51 mg/dL (ref 0.50–1.35)
GFR calc Af Amer: >90 mL/min (ref >90)
GFR calc non Af Amer: >90 mL/min (ref >90)
Glucose, Bld: 100 mg/dL — ABNORMAL HIGH (ref 70–99)
POTASSIUM: 4.3 meq/L (ref 3.7–5.3)
SODIUM: 136 meq/L — AB (ref 137–147)

## 2013-11-03 LAB — CBC WITH DIFFERENTIAL/PLATELET
Basophils Absolute: 0 10*3/uL (ref 0.0–0.1)
Basophils Relative: 0 % (ref 0–1)
EOS PCT: 2 % (ref 0–5)
Eosinophils Absolute: 0.2 10*3/uL (ref 0.0–0.7)
HCT: 34.3 % — ABNORMAL LOW (ref 39.0–52.0)
Hemoglobin: 12.1 g/dL — ABNORMAL LOW (ref 13.0–17.0)
LYMPHS PCT: 16 % (ref 12–46)
Lymphs Abs: 1.3 10*3/uL (ref 0.7–4.0)
MCH: 33 pg (ref 26.0–34.0)
MCHC: 35.3 g/dL (ref 30.0–36.0)
MCV: 93.5 fL (ref 78.0–100.0)
Monocytes Absolute: 0.8 10*3/uL (ref 0.1–1.0)
Monocytes Relative: 10 % (ref 3–12)
NEUTROS PCT: 72 % (ref 43–77)
Neutro Abs: 5.6 10*3/uL (ref 1.7–7.7)
PLATELETS: 250 10*3/uL (ref 150–400)
RBC: 3.67 MIL/uL — ABNORMAL LOW (ref 4.22–5.81)
RDW: 17.8 % — ABNORMAL HIGH (ref 11.5–15.5)
WBC: 7.9 10*3/uL (ref 4.0–10.5)

## 2013-11-03 LAB — POCT I-STAT 3, VENOUS BLOOD GAS (G3P V)
ACID-BASE EXCESS: 3 mmol/L — AB (ref 0.0–2.0)
Bicarbonate: 28.4 mEq/L — ABNORMAL HIGH (ref 20.0–24.0)
O2 Saturation: 56 %
PH VEN: 7.413 — AB (ref 7.250–7.300)
TCO2: 30 mmol/L (ref 0–100)
pCO2, Ven: 44.5 mmHg — ABNORMAL LOW (ref 45.0–50.0)
pO2, Ven: 29 mmHg — CL (ref 30.0–45.0)

## 2013-11-03 LAB — PRO B NATRIURETIC PEPTIDE: PRO B NATRI PEPTIDE: 3761 pg/mL — AB (ref 0–450)

## 2013-11-03 LAB — INFLUENZA PANEL BY PCR (TYPE A & B)
H1N1 flu by pcr: NOT DETECTED
INFLBPCR: NEGATIVE
Influenza A By PCR: NEGATIVE

## 2013-11-03 LAB — TROPONIN I

## 2013-11-03 LAB — POCT I-STAT 3, ART BLOOD GAS (G3+)
Acid-Base Excess: 2 mmol/L (ref 0.0–2.0)
BICARBONATE: 26.5 meq/L — AB (ref 20.0–24.0)
O2 Saturation: 92 %
PH ART: 7.448 (ref 7.350–7.450)
PO2 ART: 60 mmHg — AB (ref 80.0–100.0)
TCO2: 28 mmol/L (ref 0–100)
pCO2 arterial: 38.3 mmHg (ref 35.0–45.0)

## 2013-11-03 LAB — POCT I-STAT TROPONIN I: TROPONIN I, POC: 0.03 ng/mL (ref 0.00–0.08)

## 2013-11-03 LAB — MAGNESIUM: MAGNESIUM: 1.1 mg/dL — AB (ref 1.5–2.5)

## 2013-11-03 MED ORDER — ASPIRIN EC 81 MG PO TBEC
81.0000 mg | DELAYED_RELEASE_TABLET | Freq: Every day | ORAL | Status: DC
  Administered 2013-11-04 – 2013-11-07 (×4): 81 mg via ORAL
  Filled 2013-11-03 (×4): qty 1

## 2013-11-03 MED ORDER — POTASSIUM CHLORIDE CRYS ER 20 MEQ PO TBCR
20.0000 meq | EXTENDED_RELEASE_TABLET | Freq: Every day | ORAL | Status: DC
  Administered 2013-11-04 – 2013-11-07 (×4): 20 meq via ORAL
  Filled 2013-11-03 (×4): qty 1

## 2013-11-03 MED ORDER — ACETAMINOPHEN 325 MG PO TABS: 650.0000 mg | ORAL_TABLET | ORAL | Status: DC | PRN

## 2013-11-03 MED ORDER — LEVALBUTEROL HCL 0.63 MG/3ML IN NEBU
0.6300 mg | INHALATION_SOLUTION | Freq: Four times a day (QID) | RESPIRATORY_TRACT | Status: DC
  Administered 2013-11-03 – 2013-11-07 (×14): 0.63 mg via RESPIRATORY_TRACT
  Filled 2013-11-03 (×31): qty 3

## 2013-11-03 MED ORDER — FUROSEMIDE 10 MG/ML IJ SOLN
INTRAMUSCULAR | Status: AC
  Filled 2013-11-03: qty 8

## 2013-11-03 MED ORDER — OMEGA-3-ACID ETHYL ESTERS 1 G PO CAPS
1.0000 g | ORAL_CAPSULE | Freq: Every day | ORAL | Status: DC
  Administered 2013-11-03 – 2013-11-07 (×5): 1 g via ORAL
  Filled 2013-11-03 (×5): qty 1

## 2013-11-03 MED ORDER — VITAMIN B-1 100 MG PO TABS
100.0000 mg | ORAL_TABLET | Freq: Every day | ORAL | Status: DC
Start: 2013-11-03 — End: 2013-11-07
  Administered 2013-11-04 – 2013-11-07 (×4): 100 mg via ORAL
  Filled 2013-11-03 (×4): qty 1

## 2013-11-03 MED ORDER — LEVALBUTEROL HCL 0.63 MG/3ML IN NEBU: 0.6300 mg | INHALATION_SOLUTION | RESPIRATORY_TRACT | Status: DC | PRN

## 2013-11-03 MED ORDER — DEXTROSE 5 % IV SOLN
500.0000 mg | Freq: Once | INTRAVENOUS | Status: AC
  Administered 2013-11-03: 500 mg via INTRAVENOUS

## 2013-11-03 MED ORDER — LEVOFLOXACIN IN D5W 750 MG/150ML IV SOLN
750.0000 mg | INTRAVENOUS | Status: DC
  Administered 2013-11-03 – 2013-11-05 (×2): 750 mg via INTRAVENOUS
  Filled 2013-11-03 (×3): qty 150

## 2013-11-03 MED ORDER — FUROSEMIDE 10 MG/ML IJ SOLN
60.0000 mg | Freq: Two times a day (BID) | INTRAMUSCULAR | Status: DC
  Administered 2013-11-03 – 2013-11-06 (×6): 60 mg via INTRAVENOUS
  Filled 2013-11-03 (×7): qty 6

## 2013-11-03 MED ORDER — SODIUM CHLORIDE 0.9 % IJ SOLN: 3.0000 mL | INTRAMUSCULAR | Status: DC | PRN

## 2013-11-03 MED ORDER — LORAZEPAM 1 MG PO TABS
0.0000 mg | ORAL_TABLET | Freq: Four times a day (QID) | ORAL | Status: AC
  Administered 2013-11-04 – 2013-11-05 (×2): 1 mg via ORAL
  Filled 2013-11-03 (×2): qty 1

## 2013-11-03 MED ORDER — FOLIC ACID 1 MG PO TABS
1.0000 mg | ORAL_TABLET | Freq: Every day | ORAL | Status: DC
  Administered 2013-11-04 – 2013-11-07 (×4): 1 mg via ORAL
  Filled 2013-11-03 (×4): qty 1

## 2013-11-03 MED ORDER — LORAZEPAM 1 MG PO TABS: 0.0000 mg | ORAL_TABLET | Freq: Two times a day (BID) | ORAL | Status: AC

## 2013-11-03 MED ORDER — METHYLPREDNISOLONE SODIUM SUCC 125 MG IJ SOLR
80.0000 mg | Freq: Three times a day (TID) | INTRAMUSCULAR | Status: DC
  Administered 2013-11-03 – 2013-11-04 (×3): 80 mg via INTRAVENOUS
  Filled 2013-11-03 (×5): qty 1.28

## 2013-11-03 MED ORDER — IPRATROPIUM BROMIDE 0.02 % IN SOLN
0.5000 mg | Freq: Once | RESPIRATORY_TRACT | Status: AC
  Administered 2013-11-03: 0.5 mg via RESPIRATORY_TRACT
  Filled 2013-11-03: qty 2.5

## 2013-11-03 MED ORDER — ESCITALOPRAM OXALATE 10 MG PO TABS
10.0000 mg | ORAL_TABLET | Freq: Every day | ORAL | Status: DC
  Administered 2013-11-04 – 2013-11-07 (×4): 10 mg via ORAL
  Filled 2013-11-03 (×4): qty 1

## 2013-11-03 MED ORDER — PANTOPRAZOLE SODIUM 40 MG PO TBEC
80.0000 mg | DELAYED_RELEASE_TABLET | Freq: Every day | ORAL | Status: DC
  Administered 2013-11-04 – 2013-11-07 (×4): 80 mg via ORAL
  Filled 2013-11-03 (×4): qty 2

## 2013-11-03 MED ORDER — ALBUTEROL SULFATE (2.5 MG/3ML) 0.083% IN NEBU
5.0000 mg | INHALATION_SOLUTION | Freq: Once | RESPIRATORY_TRACT | Status: AC
  Administered 2013-11-03: 5 mg via RESPIRATORY_TRACT
  Filled 2013-11-03: qty 6

## 2013-11-03 MED ORDER — TEMAZEPAM 15 MG PO CAPS
15.0000 mg | ORAL_CAPSULE | Freq: Every day | ORAL | Status: DC
  Administered 2013-11-03 – 2013-11-06 (×4): 15 mg via ORAL
  Filled 2013-11-03 (×6): qty 1

## 2013-11-03 MED ORDER — SODIUM CHLORIDE 0.9 % IV SOLN: 250.0000 mL | INTRAVENOUS | Status: DC | PRN

## 2013-11-03 MED ORDER — METHYLPREDNISOLONE SODIUM SUCC 125 MG IJ SOLR
125.0000 mg | Freq: Once | INTRAMUSCULAR | Status: AC
  Administered 2013-11-03: 125 mg via INTRAVENOUS
  Filled 2013-11-03: qty 2

## 2013-11-03 MED ORDER — ENOXAPARIN SODIUM 40 MG/0.4ML ~~LOC~~ SOLN
40.0000 mg | SUBCUTANEOUS | Status: DC
  Administered 2013-11-03 – 2013-11-06 (×4): 40 mg via SUBCUTANEOUS
  Filled 2013-11-03 (×5): qty 0.4

## 2013-11-03 MED ORDER — ADULT MULTIVITAMIN W/MINERALS CH
1.0000 | ORAL_TABLET | Freq: Every day | ORAL | Status: DC
  Administered 2013-11-04 – 2013-11-07 (×4): 1 via ORAL
  Filled 2013-11-03 (×4): qty 1

## 2013-11-03 MED ORDER — LORAZEPAM 1 MG PO TABS
1.0000 mg | ORAL_TABLET | Freq: Four times a day (QID) | ORAL | Status: AC | PRN
  Administered 2013-11-03 – 2013-11-04 (×2): 1 mg via ORAL
  Filled 2013-11-03 (×2): qty 1

## 2013-11-03 MED ORDER — THIAMINE HCL 100 MG/ML IJ SOLN
100.0000 mg | Freq: Every day | INTRAMUSCULAR | Status: DC
Start: 2013-11-03 — End: 2013-11-04
  Filled 2013-11-03: qty 1

## 2013-11-03 MED ORDER — SODIUM CHLORIDE 0.9 % IJ SOLN
3.0000 mL | Freq: Two times a day (BID) | INTRAMUSCULAR | Status: DC
  Administered 2013-11-03 – 2013-11-07 (×8): 3 mL via INTRAVENOUS

## 2013-11-03 MED ORDER — IPRATROPIUM BROMIDE 0.02 % IN SOLN: 0.5000 mg | RESPIRATORY_TRACT | Status: DC | PRN

## 2013-11-03 MED ORDER — OXYCODONE-ACETAMINOPHEN 5-325 MG PO TABS
1.0000 | ORAL_TABLET | Freq: Four times a day (QID) | ORAL | Status: DC | PRN
  Administered 2013-11-03: 2 via ORAL
  Administered 2013-11-04 – 2013-11-05 (×2): 1 via ORAL
  Administered 2013-11-06 – 2013-11-07 (×5): 2 via ORAL
  Filled 2013-11-03 (×5): qty 2
  Filled 2013-11-03: qty 1
  Filled 2013-11-03 (×2): qty 2

## 2013-11-03 MED ORDER — ALBUTEROL (5 MG/ML) CONTINUOUS INHALATION SOLN
15.0000 mg/h | INHALATION_SOLUTION | Freq: Once | RESPIRATORY_TRACT | Status: AC
  Administered 2013-11-03: 15 mg/h via RESPIRATORY_TRACT
  Filled 2013-11-03: qty 20

## 2013-11-03 MED ORDER — ONDANSETRON HCL 4 MG/2ML IJ SOLN: 4.0000 mg | Freq: Four times a day (QID) | INTRAMUSCULAR | Status: DC | PRN

## 2013-11-03 MED ORDER — DEXTROSE 5 % IV SOLN
1.0000 g | Freq: Once | INTRAVENOUS | Status: AC
  Administered 2013-11-03: 1 g via INTRAVENOUS
  Filled 2013-11-03: qty 10

## 2013-11-03 MED ORDER — FUROSEMIDE 10 MG/ML IJ SOLN
40.0000 mg | Freq: Once | INTRAMUSCULAR | Status: AC
  Administered 2013-11-03: 40 mg via INTRAVENOUS
  Filled 2013-11-03: qty 4

## 2013-11-03 MED ORDER — BUDESONIDE-FORMOTEROL FUMARATE 160-4.5 MCG/ACT IN AERO
2.0000 | INHALATION_SPRAY | Freq: Two times a day (BID) | RESPIRATORY_TRACT | Status: DC
  Administered 2013-11-03 – 2013-11-07 (×8): 2 via RESPIRATORY_TRACT
  Filled 2013-11-03: qty 6

## 2013-11-03 MED ORDER — LORAZEPAM 2 MG/ML IJ SOLN: 1.0000 mg | Freq: Four times a day (QID) | INTRAMUSCULAR | Status: AC | PRN

## 2013-11-03 MED ORDER — DOCUSATE SODIUM 100 MG PO CAPS
100.0000 mg | ORAL_CAPSULE | Freq: Every day | ORAL | Status: DC
  Administered 2013-11-03 – 2013-11-07 (×5): 100 mg via ORAL
  Filled 2013-11-03 (×5): qty 1

## 2013-11-03 NOTE — ED Notes (Signed)
medtronic called sts transmission error on their side. Will have [employer] rep come to see patient.

## 2013-11-03 NOTE — ED Notes (Signed)
Patient transported to X-ray 

## 2013-11-03 NOTE — ED Notes (Signed)
Report called waiting on room to be cleaned

## 2013-11-03 NOTE — ED Notes (Signed)
Returned from x ray , tech in room now to inter gate pacemaker

## 2013-11-03 NOTE — H&P (Signed)
[facility] History and Physical  [name] [name] VHQ:[id number] DOB: 01-29-1929 DOA: 11/03/2013  Referring physician: Dr. Dina Rich PCP: [name], MD   Chief Complaint: Worsening shortness of breath  HPI: [name] [name] is a 80 y.o. male  With history of complete heart block status post permanent pacemaker, paroxysmal atrial fibrillation, hypertension, hyperlipidemia, coronary artery disease, COPD, rheumatoid arthritis, myasthenia gravis, BPH, history of alcohol abuse, history of cirrhosis of the liver, medical noncompliance who presents to the ED with a 2 week history of worsening shortness of breath, wheezing, productive cough of greenish yellowish sputum, paroxysmal nocturnal dyspnea, some lower extremity edema, some weight gain per patient. Patient denies any fever, no chills, no nausea, no vomiting, no diarrhea, no dysuria. Patient does endorse occasional abdominal pain, constipation, weakness. Patient also endorses some chronic substernal chest pain which has been ongoing for approximately 7 years. Patient also noted to have a history of falls. Patient was seen in the emergency room basic metabolic profile obtain a sodium of 136 otherwise was within normal limits. Pro BNP was elevated at 3761. CBC with a hemoglobin of 12.1 otherwise was within normal limits. Chest x-ray which was done showed a small right pleural effusion with right basilar atelectasis or infiltrate. No pulmonary edema. Due to concern for possible pneumonia and wheezing patient was given a dose of IV Solu-Medrol as well as IV Rocephin and azithromycin. We were called to admit the patient for further evaluation and management.   Review of Systems:  Constitutional:  No weight loss, night sweats, Fevers, chills, fatigue.  HEENT:  No headaches, Difficulty swallowing,Tooth/dental problems,Sore throat,  No sneezing, itching, ear ache, nasal congestion, post nasal drip,  Cardio-vascular:  No chest pain, Orthopnea, PND,  swelling in lower extremities, anasarca, dizziness, palpitations  GI:  No heartburn, indigestion, abdominal pain, nausea, vomiting, diarrhea, change in bowel habits, loss of appetite  Resp:  No shortness of breath with exertion or at rest. No excess mucus, no productive cough, No non-productive cough, No coughing up of blood.No change in color of mucus.No wheezing.No chest wall deformity  Skin:  no rash or lesions.  GU:  no dysuria, change in color of urine, no urgency or frequency. No flank pain.  Musculoskeletal:  No joint pain or swelling. No decreased range of motion. No back pain.  Psych:  No change in mood or affect. No depression or anxiety. No memory loss.   Past Medical History  Diagnosis Date  . Complete heart block     s/p PPM  . PAF (paroxysmal atrial fibrillation)   . HTN (hypertension)   . HLD (hyperlipidemia)   . CAD (coronary artery disease)   . Depression   . Anxiety   . Hemorrhoids, external   . GERD (gastroesophageal reflux disease)   . Hyperplastic colonic polyp   . Diverticulosis of colon   . COPD (chronic obstructive pulmonary disease)   . Myasthenia gravis   . Arthritis, rheumatoid   . BPH (benign prostatic hypertrophy)   . Cirrhosis of liver   . Asthma   . Alcohol abuse   . Hiatal hernia   . Skin cancer of forehead     and left ear  . Lung nodule   . Nephrolithiasis   . CHF (congestive heart failure)    Past Surgical History  Procedure Laterality Date  . Total knee arthroplasty      bilateral  . Total hip arthroplasty      x3  . Splenectomy    . Appendectomy    .  Pacemaker insertion  2009    medtronic  . Cardiac catheterization  10/12/2007    EF 60%  . Inguinal hernia repair     Social History:  reports that he quit smoking about 26 years ago. His smoking use included Cigars. He has never used smokeless tobacco. He reports that he drinks about 8.4 ounces of alcohol per week. He reports that he does not use illicit drugs.  Allergies   Allergen Reactions  . Remicade [Infliximab] Other (See Comments)    Almost died  . Alendronate Sodium Other (See Comments)    Unknown.  . Benazepril Other (See Comments)    Unknown.  . Celebrex [Celecoxib] Nausea And Vomiting    Family History  Problem Relation Age of Onset  . Myasthenia gravis Mother   . Esophageal cancer Father   . HIV Son     Aids  . Diabetes Son      Prior to Admission medications   Medication Sig Start Date End Date Taking? Authorizing Provider  aspirin EC 81 MG tablet Take 81 mg by mouth daily.   Yes Historical Provider, MD  azaTHIOprine (IMURAN) 50 MG tablet Take 75 mg by mouth 3 (three) times daily.     Yes Historical Provider, MD  budesonide-formoterol (SYMBICORT) 160-4.5 MCG/ACT inhaler Inhale 2 puffs into the lungs 2 (two) times daily.   Yes Historical Provider, MD  docusate sodium (COLACE) 100 MG capsule Take 100 mg by mouth 2 (two) times daily as needed for constipation.   Yes Historical Provider, MD  escitalopram (LEXAPRO) 10 MG tablet Take 10 mg by mouth daily.    Yes Historical Provider, MD  esomeprazole (NEXIUM) 40 MG capsule Take 40 mg by mouth 2 (two) times daily before a meal.    Yes Historical Provider, MD  FIBER PO Take 1 tablet by mouth daily.   Yes Historical Provider, MD  furosemide (LASIX) 40 MG tablet Take 40 mg by mouth daily.  09/27/13  Yes Burtis Junes, NP  Omega-3 Fatty Acids (FISH OIL) 1000 MG CAPS Take 1 capsule by mouth daily.   Yes Historical Provider, MD  oxyCODONE-acetaminophen (PERCOCET/ROXICET) 5-325 MG per tablet Take 1-2 tablets by mouth every 6 (six) hours as needed for moderate pain.    Yes Historical Provider, MD  potassium chloride SA (K-DUR,KLOR-CON) 20 MEQ tablet Take 20 mEq by mouth daily. Take with lasix pill 09/27/13  Yes Burtis Junes, NP  temazepam (RESTORIL) 15 MG capsule Take 15 mg by mouth daily. For sleep   Yes Historical Provider, MD  triamcinolone cream (KENALOG) 0.1 % Apply 1 application topically 2  (two) times daily as needed (to affected area).  03/19/12  Yes Historical Provider, MD   Physical Exam: Filed Vitals:   11/03/13 1345  BP: 146/67  Pulse: 60  Temp:   Resp: 14    BP 146/67  Pulse 60  Temp(Src) 97.7 F (36.5 C)  Resp 14  SpO2 97%  General:  Appears calm and comfortable. nacpd. Eyes: PERRL, normal lids, irises & conjunctiva ENT: grossly normal hearing, lips & tongue Neck: no LAD, masses or thyromegaly Cardiovascular: RRR, no m/r/g. 2-3+ bilateral LE edema. Telemetry: SR, no arrhythmias  Respiratory: Diffuse coarse rhonchorous breath sounds. Some wheezing noted. Abdomen: soft, ntnd, positive bowel sounds. Skin: no rash or induration seen on limited exam Musculoskeletal: grossly normal tone BUE/BLE Psychiatric: grossly normal mood and affect, speech fluent and appropriate Neurologic: Alert to self and place. Tenderness 2 through 12 are grossly intact. No focal  deficits.           Labs on Admission:  Basic Metabolic Panel:  Recent Labs Lab 11/03/13 1327  NA 136*  K 4.3  CL 97  CO2 27  GLUCOSE 100*  BUN 14  CREATININE 0.51  CALCIUM 8.9   Liver Function Tests: No results found for this basename: AST, ALT, ALKPHOS, BILITOT, PROT, ALBUMIN,  in the last 168 hours No results found for this basename: LIPASE, AMYLASE,  in the last 168 hours No results found for this basename: AMMONIA,  in the last 168 hours CBC:  Recent Labs Lab 11/03/13 1327  WBC 7.9  NEUTROABS 5.6  HGB 12.1*  HCT 34.3*  MCV 93.5  PLT 250   Cardiac Enzymes: No results found for this basename: CKTOTAL, CKMB, CKMBINDEX, TROPONINI,  in the last 168 hours  BNP (last 3 results)  Recent Labs  09/27/13 1541 11/03/13 1327  PROBNP 584.0* 3761.0*   CBG: No results found for this basename: GLUCAP,  in the last 168 hours  Radiological Exams on Admission: Dg Chest 2 View  11/03/2013   CLINICAL DATA:  Chest pain, shortness of Breath  EXAM: CHEST  2 VIEW  COMPARISON:  12/27/2011   FINDINGS: Cardiomediastinal silhouette is stable. Dual lead cardiac pacemaker is unchanged in position. Stable calcified granuloma right lower lobe. There is small right pleural effusion with right basilar atelectasis or infiltrate. Mild degenerative changes thoracic spine. No pulmonary edema. Linear scarring in the lingula.  IMPRESSION: Small right pleural effusion with right basilar atelectasis or infiltrate. No pulmonary edema.   Electronically Signed   By: Lahoma Crocker M.D.   On: 11/03/2013 14:23   Dg Hip Complete Left  11/03/2013   CLINICAL DATA:  Chronic left hip pain  EXAM: LEFT HIP - COMPLETE 2+ VIEW  COMPARISON:  12/03/2008  FINDINGS: Three views of left hip submitted. Again noted left hip prosthesis in anatomic alignment. Again noted chronic fractured cerclage wires with a chronic bone fragment superior to femoral component without change from prior exam. No evidence of prosthesis loosening. Degenerative changes with diffuse narrowing of joint space nose is right hip joint. No acute fracture or subluxation.  IMPRESSION: No acute fracture or subluxation. Left hip prosthesis in anatomic alignment. Degenerative changes right hip joint.   Electronically Signed   By: Lahoma Crocker M.D.   On: 11/03/2013 16:28    EKG: Independently reviewed. Ventricular paced rhythm  Assessment/Plan Principal Problem:   Dyspnea Active Problems:   ANXIETY   DEPRESSION   GERD   ARTHRITIS, RHEUMATOID   Essential hypertension, benign   Complete heart block   Atrial fibrillation   CAD (coronary artery disease)   CHF (congestive heart failure)   CAP (community acquired pneumonia): Probable   COPD with acute exacerbation: Probable   Bronchitis: Probable  #1 dyspnea Likely multifactorial secondary to acute systolic CHF exacerbation versus acute COPD exacerbation versus community acquired pneumonia/bronchitis. Will admit the patient to telemetry. Patient was noted to have an elevated BNP higher than his baseline. EKG  with a ventricular paced rhythm. Cycle cardiac enzymes every 6 hours x3. Patient with recent 2-D echo done 11/ 2014 and a such will not repeat 2-D echo. Patient has been started empirically on IV antibiotics for probable community-acquired pneumonia and a such will place patient on IV Levaquin. Will also place patient on oxygen, IV Solu-Medrol with taper, nebulizer treatments. Strict is and os. Daily weights.  #2 probable community-acquired pneumonia versus bronchitis Condition is present with a two-week  history of shortness of breath with a productive cough. Chest x-ray with atelectasis versus pneumonia. Will admit the patient. We'll place empirically on IV Levaquin. Follow.  #3 probable acute on chronic systolic CHF exacerbation Questionable etiology. Likely secondary to medical noncompliance as patient states has not been taking his diuretics like his posterior due to lifestyle issues. Cycle cardiac enzymes every 6 hours x3. Patient had a recent 2-D echo done November of 2014 which showed an EF of 40-45%. No wall motion abnormalities. Mild concentric hypertrophy. Will place patient on IV Lasix 60 mg every 12 hours, aspirin, Aldactone.  #4. acute COPD exacerbation Place on oxygen, nebulizer treatments, IV Levaquin., IV steroid taper.  #5 hypertension Continue Aldactone, Lasix.  #6 history of rheumatoid arthritis/myasthenia gravis Stable. Imuran on hold secondary to problems #1 and 2. Follow.  #7 history of complete heart block status post PPM Pacemaker has been interrogated and okay.  #8 anxiety/depression Stable. Continue Lexapro.  #9 atrial fibrillation Stable. Currently rate controlled. Follow.  #10 gastroesophageal reflux disease PPI.  #11 history of alcohol abuse Stable. Will place on Ativan withdrawal protocol.  #12 prophylaxis PPI for GI prophylaxis. Lovenox for DVT prophylaxis.   Code Status: Full Family Communication: Updated patient no family at bedside. Disposition  Plan: Admit to telemetry.  Time spent: [facility] Pager [phone]

## 2013-11-03 NOTE — ED Provider Notes (Signed)
CSN: [id number]     Arrival date & time 11/03/13  1247 History   First MD Initiated Contact with Patient 11/03/13 1300     Chief Complaint  Patient presents with  . Shortness of Breath   (Consider location/radiation/quality/duration/timing/severity/associated sxs/prior Treatment) HPI Comments: Patient is an 80 year old male with an extensive PMHx including CHF, CAD and COPD who presents to the emergency department with his daughter complaining of shortness of breath x2 weeks, worsening over the past few days. Shortness of breath both at rest and on exertion. Admits to associated productive cough with yellow mucus, increased wheezing. Denies fever, chills, nausea or vomiting. Patient lives at home alone. He is a former smoker, has not smoked for many years. He uses Symbicort at home, has been taking all his medications as prescribed. Admits to associated chest pain, worse with coughing.  Patient is a 80 y.o. male presenting with shortness of breath. The history is provided by the patient and a relative.  Shortness of Breath Associated symptoms: cough and wheezing     Past Medical History  Diagnosis Date  . Complete heart block     s/p PPM  . PAF (paroxysmal atrial fibrillation)   . HTN (hypertension)   . HLD (hyperlipidemia)   . CAD (coronary artery disease)   . Depression   . Anxiety   . Hemorrhoids, external   . GERD (gastroesophageal reflux disease)   . Hyperplastic colonic polyp   . Diverticulosis of colon   . COPD (chronic obstructive pulmonary disease)   . Myasthenia gravis   . Arthritis, rheumatoid   . BPH (benign prostatic hypertrophy)   . Cirrhosis of liver   . Asthma   . Alcohol abuse   . Hiatal hernia   . Skin cancer of forehead     and left ear  . Lung nodule   . Nephrolithiasis   . CHF (congestive heart failure)    Past Surgical History  Procedure Laterality Date  . Total knee arthroplasty      bilateral  . Total hip arthroplasty      x3  . Splenectomy     . Appendectomy    . Pacemaker insertion  2009    medtronic  . Cardiac catheterization  10/12/2007    EF 60%  . Inguinal hernia repair     Family History  Problem Relation Age of Onset  . Myasthenia gravis Mother   . Esophageal cancer Father   . HIV Son     Aids  . Diabetes Son    History  Substance Use Topics  . Smoking status: Former Smoker -- 40 years    Types: Cigars    Quit date: 10/22/1987  . Smokeless tobacco: Never Used  . Alcohol Use: 8.4 oz/week    14 Cans of beer per week    Review of Systems  Respiratory: Positive for cough, shortness of breath and wheezing.   Neurological: Positive for weakness.  All other systems reviewed and are negative.    Allergies  Remicade; Alendronate sodium; Benazepril; and Celebrex  Home Medications   Current Outpatient Rx  Name  Route  Sig  Dispense  Refill  . aspirin EC 81 MG tablet   Oral   Take 81 mg by mouth daily.         Marland Kitchen azaTHIOprine (IMURAN) 50 MG tablet   Oral   Take 75 mg by mouth 3 (three) times daily.           . budesonide-formoterol (SYMBICORT) 160-4.5  MCG/ACT inhaler   Inhalation   Inhale 2 puffs into the lungs 2 (two) times daily.         Marland Kitchen docusate sodium (COLACE) 100 MG capsule   Oral   Take 100 mg by mouth 2 (two) times daily as needed for constipation.         Marland Kitchen escitalopram (LEXAPRO) 10 MG tablet   Oral   Take 10 mg by mouth daily.          Marland Kitchen esomeprazole (NEXIUM) 40 MG capsule   Oral   Take 40 mg by mouth daily before breakfast.          . FIBER PO   Oral   Take 1 tablet by mouth daily.         . furosemide (LASIX) 40 MG tablet   Oral   Take 20 mg by mouth as needed.         Marland Kitchen morphine (MS CONTIN) 15 MG 12 hr tablet   Oral   Take 15 mg by mouth as needed.          . mupirocin ointment (BACTROBAN) 2 %   Topical   Apply 1 application topically daily.          . Omega-3 Fatty Acids (FISH OIL) 1000 MG CAPS   Oral   Take 1 capsule by mouth daily.          Marland Kitchen oxyCODONE-acetaminophen (PERCOCET/ROXICET) 5-325 MG per tablet   Oral   Take 1 tablet by mouth every 4 (four) hours as needed for pain.         . potassium chloride SA (K-DUR,KLOR-CON) 20 MEQ tablet   Oral   Take 20 mEq by mouth as needed.         Marland Kitchen spironolactone (ALDACTONE) 50 MG tablet   Oral   Take 25 mg by mouth daily.          . temazepam (RESTORIL) 15 MG capsule   Oral   Take 15 mg by mouth daily. For sleep         . triamcinolone cream (KENALOG) 0.1 %   Topical   Apply 1 application topically 2 (two) times daily.           BP 146/67  Pulse 60  Temp(Src) 97.7 F (36.5 C)  Resp 14  SpO2 97% Physical Exam  Nursing note and vitals reviewed. Constitutional: He is oriented to person, place, and time. He appears well-developed and well-nourished. He appears cachectic. No distress.  HENT:  Head: Normocephalic and atraumatic.  Mouth/Throat: Oropharynx is clear and moist.  Eyes: Conjunctivae are normal.  Neck: Normal range of motion. Neck supple.  Cardiovascular: Normal rate, regular rhythm, normal heart sounds and intact distal pulses.   No extremity edema.  Pulmonary/Chest: Breath sounds normal. Tachypnea noted. No respiratory distress.  Scattered wheezes, rhonchi and rales. Speaking in full sentences.  Abdominal: Soft. Bowel sounds are normal. There is no tenderness.  Musculoskeletal: Normal range of motion. He exhibits no edema.  Neurological: He is alert and oriented to person, place, and time.  Skin: Skin is warm and dry. He is not diaphoretic.  Psychiatric: He has a normal mood and affect. His behavior is normal.    ED Course  Procedures (including critical care time) Labs Review Labs Reviewed  CBC WITH DIFFERENTIAL - Abnormal; Notable for the following:    RBC 3.67 (*)    Hemoglobin 12.1 (*)    HCT 34.3 (*)    RDW  17.8 (*)    All other components within normal limits  BASIC METABOLIC PANEL - Abnormal; Notable for the following:    Sodium  136 (*)    Glucose, Bld 100 (*)    All other components within normal limits  PRO B NATRIURETIC PEPTIDE - Abnormal; Notable for the following:    Pro B Natriuretic peptide (BNP) 3761.0 (*)    All other components within normal limits  POCT I-STAT TROPONIN I   Imaging Review Dg Chest 2 View  11/03/2013   CLINICAL DATA:  Chest pain, shortness of Breath  EXAM: CHEST  2 VIEW  COMPARISON:  12/27/2011  FINDINGS: Cardiomediastinal silhouette is stable. Dual lead cardiac pacemaker is unchanged in position. Stable calcified granuloma right lower lobe. There is small right pleural effusion with right basilar atelectasis or infiltrate. Mild degenerative changes thoracic spine. No pulmonary edema. Linear scarring in the lingula.  IMPRESSION: Small right pleural effusion with right basilar atelectasis or infiltrate. No pulmonary edema.   Electronically Signed   By: Lahoma Crocker M.D.   On: 11/03/2013 14:23    EKG Interpretation    Date/Time:  Wednesday November 03 2013 13:00:38 EST Ventricular Rate:  62 PR Interval:    QRS Duration: 154 QT Interval:  542 QTC Calculation: 550 R Axis:   -56 Text Interpretation:  Ventricular-paced rhythm Abnormal ECG Confirmed by HORTON  MD, COURTNEY ([id number]) on 11/03/2013 1:17:36 PM            MDM   1. CHF (congestive heart failure)   2. COPD (chronic obstructive pulmonary disease)     Patient presenting with shortness of breath, cough. Patient appears cachectic but in no apparent distress. Speaking in full since without difficulty. O2 sat 100% on room air. Lungs with scattered wheezes, rhonchi and rales, poor air movement. Plan to give DuoNeb, Solu-Medrol. Labs pending. Patient has a pacemaker, will interrogate pacer given shortness of breath. Chest x-ray pending. Plan to admit patient. Case discussed with attending Dr. Dina Rich who agrees with plan of care. 3:18 PM Chest x-ray showing small right pleural effusion with right basilar atelectasis or infiltrate. No  pulmonary edema. BNP 3761, elevated from prior in Dec 2014. No leukocytosis. Troponin negative. Given patient's increased risk for infection with COPD, will give IV abx, azithromycin and Rocephin. 40mg  IV lasix given. Influenza panel pending. Breath sounds without improvement. Vitals remain stable. Patient will be admitted, admission and accepted by Dr. Grandville Silos, [facility]. Pt also seen by Dr. Dina Rich.   Illene Labrador, PA-C 11/03/13 1520

## 2013-11-03 NOTE — ED Notes (Signed)
Ordered Heart Health diet 

## 2013-11-03 NOTE — ED Notes (Signed)
Sob and wheezing x 2 weeks  Coughing  weakness

## 2013-11-03 NOTE — ED Notes (Signed)
Pacemaker interrogated. 

## 2013-11-03 NOTE — ED Notes (Signed)
istat venous blood gas shown to [place], Therapist, sports

## 2013-11-04 ENCOUNTER — Encounter (HOSPITAL_COMMUNITY): Payer: Self-pay | Admitting: General Practice

## 2013-11-04 DIAGNOSIS — F329 Major depressive disorder, single episode, unspecified: Secondary | ICD-10-CM

## 2013-11-04 DIAGNOSIS — F3289 Other specified depressive episodes: Secondary | ICD-10-CM

## 2013-11-04 DIAGNOSIS — M069 Rheumatoid arthritis, unspecified: Secondary | ICD-10-CM

## 2013-11-04 DIAGNOSIS — J441 Chronic obstructive pulmonary disease with (acute) exacerbation: Secondary | ICD-10-CM

## 2013-11-04 DIAGNOSIS — K219 Gastro-esophageal reflux disease without esophagitis: Secondary | ICD-10-CM

## 2013-11-04 DIAGNOSIS — R0602 Shortness of breath: Secondary | ICD-10-CM | POA: Diagnosis present

## 2013-11-04 DIAGNOSIS — F1010 Alcohol abuse, uncomplicated: Secondary | ICD-10-CM

## 2013-11-04 DIAGNOSIS — I10 Essential (primary) hypertension: Secondary | ICD-10-CM

## 2013-11-04 LAB — BASIC METABOLIC PANEL
BUN: 19 mg/dL (ref 6–23)
CO2: 27 mEq/L (ref 19–32)
Calcium: 8.6 mg/dL (ref 8.4–10.5)
Chloride: 96 mEq/L (ref 96–112)
Creatinine, Ser: 0.61 mg/dL (ref 0.50–1.35)
Glucose, Bld: 197 mg/dL — ABNORMAL HIGH (ref 70–99)
POTASSIUM: 4.5 meq/L (ref 3.7–5.3)
Sodium: 137 mEq/L (ref 137–147)

## 2013-11-04 LAB — TROPONIN I: Troponin I: <0.3 ng/mL (ref <0.30)

## 2013-11-04 LAB — PRO B NATRIURETIC PEPTIDE: Pro B Natriuretic peptide (BNP): 3390 pg/mL — ABNORMAL HIGH (ref 0–450)

## 2013-11-04 MED ORDER — PREDNISONE 20 MG PO TABS
40.0000 mg | ORAL_TABLET | Freq: Two times a day (BID) | ORAL | Status: DC
  Administered 2013-11-05 – 2013-11-06 (×3): 40 mg via ORAL
  Filled 2013-11-04 (×6): qty 2

## 2013-11-04 MED ORDER — MAGNESIUM SULFATE 40 MG/ML IJ SOLN
2.0000 g | Freq: Once | INTRAMUSCULAR | Status: AC
  Administered 2013-11-04: 12:00:00 2 g via INTRAVENOUS
  Filled 2013-11-04: qty 50

## 2013-11-04 NOTE — Progress Notes (Signed)
[facility] PROGRESS NOTE  Filed Weights   11/03/13 2052 11/04/13 0400  Weight: 68.811 kg (151 lb 11.2 oz) 67.813 kg (149 lb 8 oz)        Intake/Output Summary (Last 24 hours) at 11/04/13 2305 Last data filed at 11/04/13 1812  Gross per 24 hour  Intake    963 ml  Output   1225 ml  Net   -262 ml     Assessment/Plan:  #1 acute resp failure: multifactorial -due to CHF exacerbation due to medication non compliance and also COPD exacerbation due to bronchitis/PNA. -will continue steroids, antibiotics, IV lasix -daily weight and strict I's and O's -Follow clinical response  #2 probable community-acquired pneumonia versus bronchitis  Condition is present with a two-week history of shortness of breath with a productive cough.  -Chest x-ray with atelectasis versus pneumonia.  -continue tx with antibiotics.  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  -continue IV lasix, daily weight, strict I's and O's and daily BMET. -still with signs of fluid overload.  #4. acute COPD exacerbation  Continue PRN oxygen, nebulizer treatments, IV Levaquin and steroids. -since wheezing and breathing improving, will start tapering steroids.   #5 hypertension  Continue Aldactone, Lasix.  -stable.  #6 history of rheumatoid arthritis/myasthenia gravis  Stable. Imuran on hold secondary to problems #1 and 2. Follow with PCP and rheumatology as an outpatint.  #7 history of complete heart block status post PPM  Pacemaker has been interrogated and okay.  -monitoring on telemetry  #8 anxiety/depression  Stable. Continue lexapro   #9 atrial fibrillation  Stable. Currently rate controlled. Follow.   #10 gastroesophageal reflux disease  Continue PPI.   #11 history of alcohol abuse  Will continue PRN Ativan/withdrawal protocol.  -cessation counseling  provided  #12 prophylaxis  PPI for GI prophylaxis. Lovenox for DVT prophylaxis.    Code Status: Full Family Communication: daughter at bedside Disposition Plan: home at discharge. Family planning to check on him for med compliance and encourage outpatient PT.   Consultants:  None   Procedures: ECHO: done on 08/2013 - Left ventricle: The cavity size was normal. There was mild concentric hypertrophy. Systolic function was mildly to moderately reduced. The estimated ejection fraction was in the range of 40% to 45%. Wall motion was normal; there were no regional wall motion abnormalities. Doppler parameters are consistent with a restrictive pattern, indicative of decreased left ventricular diastolic compliance and/or increased left atrial pressure (grade 3 diastolic dysfunction).  Antibiotics:  levaquin  HPI/Subjective: Reports he is breathing somewhat better. Still with signs of fluid overload on exam. No fever and no elevated WBC's.  Objective: Filed Vitals:   11/04/13 1743 11/04/13 1924 11/04/13 1934 11/04/13 2100  BP: 110/68   132/60  Pulse: 61 61 61 59  Temp:    98.8 F (37.1 C)  TempSrc:    Oral  Resp: 18 18 18 18   Height:      Weight:      SpO2: 96% 95% 96% 97%     Exam:  General: Alert, awake, oriented x3, in no acute distress; reports he is breathing better. Now just tired from PT work up.  HEENT: No bruits, no goiter; positive JVD Heart: Regular rate and rhythm,  Negative murmurs, rubs, gallops.  Positive 1-2+ LE edema bilaterally  Lungs: improved air movement, positive mild exp wheezing and rhonchi; fine crackles at bases  Abdomen: Soft, nontender, nondistended, positive bowel sounds.  Neuro: Grossly intact, nonfocal.   Data Reviewed: Basic Metabolic Panel:  Recent Labs Lab 11/03/13 1327 11/03/13 2125 11/04/13 0508  NA 136*  --  137  K 4.3  --  4.5  CL 97  --  96  CO2 27  --  27  GLUCOSE 100*  --  197*  BUN 14  --  19  CREATININE 0.51  --   0.61  CALCIUM 8.9  --  8.6  MG  --  1.1*  --    Liver Function Tests: No results found for this basename: AST, ALT, ALKPHOS, BILITOT, PROT, ALBUMIN,  in the last 168 hours No results found for this basename: LIPASE, AMYLASE,  in the last 168 hours No results found for this basename: AMMONIA,  in the last 168 hours CBC:  Recent Labs Lab 11/03/13 1327  WBC 7.9  NEUTROABS 5.6  HGB 12.1*  HCT 34.3*  MCV 93.5  PLT 250   Cardiac Enzymes:  Recent Labs Lab 11/03/13 2125 11/04/13 0508 11/04/13 0733  TROPONINI <0.30 <0.30 <0.30   BNP (last 3 results)  Recent Labs  09/27/13 1541 11/03/13 1327 11/04/13 0508  PROBNP 584.0* 3761.0* 3390.0*    Studies: Dg Chest 2 View  11/03/2013   CLINICAL DATA:  Chest pain, shortness of Breath  EXAM: CHEST  2 VIEW  COMPARISON:  12/27/2011  FINDINGS: Cardiomediastinal silhouette is stable. Dual lead cardiac pacemaker is unchanged in position. Stable calcified granuloma right lower lobe. There is small right pleural effusion with right basilar atelectasis or infiltrate. Mild degenerative changes thoracic spine. No pulmonary edema. Linear scarring in the lingula.  IMPRESSION: Small right pleural effusion with right basilar atelectasis or infiltrate. No pulmonary edema.   Electronically Signed   By: Lahoma Crocker M.D.   On: 11/03/2013 14:23   Dg Hip Complete Left  11/03/2013   CLINICAL DATA:  Chronic left hip pain  EXAM: LEFT HIP - COMPLETE 2+ VIEW  COMPARISON:  12/03/2008  FINDINGS: Three views of left hip submitted. Again noted left hip prosthesis in anatomic alignment. Again noted chronic fractured cerclage wires with a chronic bone fragment superior to femoral component without change from prior exam. No evidence of prosthesis loosening. Degenerative changes with diffuse narrowing of joint space nose is right hip joint. No acute fracture or subluxation.  IMPRESSION: No acute fracture or subluxation. Left hip prosthesis in anatomic alignment. Degenerative  changes right hip joint.   Electronically Signed   By: Lahoma Crocker M.D.   On: 11/03/2013 16:28    Scheduled Meds: . aspirin EC  81 mg Oral Daily  . budesonide-formoterol  2 puff Inhalation BID  . docusate sodium  100 mg Oral Daily  . enoxaparin (LOVENOX) injection  40 mg Subcutaneous Q24H  . escitalopram  10 mg Oral Daily  . folic acid  1 mg Oral Daily  . furosemide  60 mg Intravenous BID  . levalbuterol  0.63 mg Nebulization Q6H  . levofloxacin (LEVAQUIN) IV  750 mg Intravenous Q24H  . LORazepam  0-4 mg Oral Q6H   Followed by  . [START ON 11/05/2013] LORazepam  0-4 mg Oral Q12H  . multivitamin with minerals  1 tablet Oral Daily  . omega-3 acid ethyl esters  1 g Oral Daily  . pantoprazole  80 mg Oral Q1200  . potassium chloride SA  20 mEq Oral Daily  . [START ON 11/05/2013] predniSONE  40 mg Oral BID WC  . sodium chloride  3 mL Intravenous Q12H  . temazepam  15 mg Oral QHS  . thiamine  100 mg Oral Daily   Continuous Infusions:   Time: >30 minutes  Sherronda Sweigert  [facility] Pager [phone]. If 8PM-8AM, please contact night-coverage at [email or web address], password [facility] 11/04/2013, 11:05 PM  LOS: 1 day

## 2013-11-04 NOTE — Progress Notes (Signed)
The patient was placed on 2L of oxygen due to SOB.  His O2 sats are still in the mid-90s.

## 2013-11-04 NOTE — Evaluation (Signed)
Physical Therapy Evaluation Patient Details Name: [name] [name] MRN: [id number] DOB: 1929-03-13 Today's Date: 11/04/2013 Time: 9562-1308 PT Time Calculation (min): 23 min  PT Assessment / Plan / Recommendation History of Present Illness  With history of complete heart block status post permanent pacemaker, paroxysmal atrial fibrillation, hypertension, hyperlipidemia, coronary artery disease, COPD, rheumatoid arthritis, myasthenia gravis, BPH, history of alcohol abuse, history of cirrhosis of the liver, medical noncompliance who presents to the ED with a 2 week history of worsening shortness of breath, wheezing, productive cough of greenish yellowish sputum, paroxysmal nocturnal dyspnea, some lower extremity edema, some weight gain per patient. CHF  Clinical Impression  Pt with report of several falls in the last year. Lives alone and very resistant to using shower chair or bench or AD. Pt with "worn out THA" on left with trendelenburg gait. Dgtr present during session stating pt is stubborn, "high drama", "addicted to pain meds and alcohol" stating that she just checks on him and has given up on getting him to change habits. Pt with balance deficit and impaired gait who would benefit from acute therapy as well as OPPT to maximize strength and balance.     PT Assessment  Patient needs continued PT services    Follow Up Recommendations  Outpatient PT    Does the patient have the potential to tolerate intense rehabilitation      Barriers to Discharge Decreased caregiver support      Equipment Recommendations  None recommended by PT    Recommendations for Other Services     Frequency Min 3X/week    Precautions / Restrictions Precautions Precautions: Fall   Pertinent Vitals/Pain 95% throughout on RA Pt with generalized pain hips and shoulders with RN aware and medicating beginning of session      Mobility  Bed Mobility General bed mobility comments: pt EOB on  arrival Transfers Overall transfer level: Modified independent Ambulation/Gait Ambulation/Gait assistance: Supervision Ambulation Distance (Feet): 300 Feet Assistive device: None Gait Pattern/deviations: Trendelenburg;Step-through pattern Gait velocity interpretation: at or above normal speed for age/gender Stairs: Yes Stairs assistance: Modified independent (Device/Increase time) Stair Management: One rail Right;Forwards;Step to pattern;Backwards Number of Stairs: 3 General stair comments: pt forward ascending and backed down stairs    Exercises     PT Diagnosis: Difficulty walking  PT Problem List: Decreased balance;Decreased safety awareness PT Treatment Interventions: Gait training;DME instruction;Balance training;Therapeutic activities;Patient/family education     PT Goals(Current goals can be found in the care plan section) Acute Rehab PT Goals Patient Stated Goal: return to work PT Goal Formulation: With patient Time For Goal Achievement: 11/11/13 Potential to Achieve Goals: Good  Visit Information  Last PT Received On: 11/04/13 Assistance Needed: +1 History of Present Illness: With history of complete heart block status post permanent pacemaker, paroxysmal atrial fibrillation, hypertension, hyperlipidemia, coronary artery disease, COPD, rheumatoid arthritis, myasthenia gravis, BPH, history of alcohol abuse, history of cirrhosis of the liver, medical noncompliance who presents to the ED with a 2 week history of worsening shortness of breath, wheezing, productive cough of greenish yellowish sputum, paroxysmal nocturnal dyspnea, some lower extremity edema, some weight gain per patient. CHF       Prior Functioning  Home Living Family/patient expects to be discharged to:: Private residence Living Arrangements: Alone Available Help at Discharge: Family;Available PRN/intermittently Type of Home: House Home Access: Stairs to enter [employer] of Steps:  3 Entrance Stairs-Rails: Right Home Layout: One level Home Equipment: Bedside commode;Walker - 2 wheels;Cane - single point;Crutches Additional Comments: pt  can't fit RW in the house due to clutter per dgtr. 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    Cognition  Cognition Arousal/Alertness: Awake/alert Behavior During Therapy: WFL for tasks assessed/performed Overall Cognitive Status: Within Functional Limits for tasks assessed    Extremity/Trunk Assessment Upper Extremity Assessment Upper Extremity Assessment: Defer to OT evaluation Lower Extremity Assessment Lower Extremity Assessment: Generalized weakness Cervical / Trunk Assessment Cervical / Trunk Assessment: Normal   Balance Balance Overall balance assessment: History of Falls [place] - Eyes Opened: 60 [place] - Eyes Closed: 1 General Comments General comments (skin integrity, edema, etc.): pt with LOB as soon as closed eyes in [place]  End of Session PT - End of Session Equipment Utilized During Treatment: Gait belt Activity Tolerance: Patient tolerated treatment well Patient left: in bed;with nursing/sitter in room;with family/visitor present Nurse Communication: Mobility status  GP     Lanetta Inch Beth 11/04/2013, 1:14 PM Elwyn Reach, [place]

## 2013-11-04 NOTE — ED Provider Notes (Signed)
Medical screening examination/treatment/procedure(s) were conducted as a shared visit with non-physician practitioner(s) and myself.  I personally evaluated the patient during the encounter.  EKG Interpretation    Date/Time:  Wednesday November 03 2013 13:00:38 EST Ventricular Rate:  62 PR Interval:    QRS Duration: 154 QT Interval:  542 QTC Calculation: 550 R Axis:   -56 Text Interpretation:  Ventricular-paced rhythm Abnormal ECG Confirmed by HORTON  MD, Loma Sousa ([id number]) on 11/03/2013 1:17:36 PM            Patient presents with shortness of breath. History of COPD and CHF. Not hypoxic on exam; however, coarse Rales and wheezing bilaterally with fair air movement. Patient was given a duo nebs, steroids, and antibiotics for presumed COPD exacerbation.  EKG reassuring and pacemaker to be interrogated. Patient continues to have poor pulmonary exam following duo neb and steroids treatment. We'll send influenza and admit to hospital for further management.  Merryl Hacker, MD 11/04/13 [phone]

## 2013-11-04 NOTE — Progress Notes (Signed)
The patient arrived to 3E09 at 2010 from the ED.  He was placed on telemetry and oriented to the unit.  VS were taken and the patient was assessed.  He is A&Ox4, but admits he is forgetful.  The call bell was placed within reach and the bed alarm was turned on.  He was placed on droplet precautions for rule-out of the flu.

## 2013-11-04 NOTE — Progress Notes (Signed)
UR completed Khamora Karan K. Felesha Moncrieffe, RN, BSN, [place], CCM  11/04/2013 12:23 PM

## 2013-11-04 NOTE — Progress Notes (Signed)
The patient's first CIWA score was 5.  He was given 1 mg of Ativan for anxiety and he was able to sleep some during the night.  He jumped out of bed throughout the night and was re-educated multiple times on the importance of calling the nurse.  The bed alarm was kept on all night.

## 2013-11-05 DIAGNOSIS — I509 Heart failure, unspecified: Secondary | ICD-10-CM

## 2013-11-05 DIAGNOSIS — I442 Atrioventricular block, complete: Secondary | ICD-10-CM

## 2013-11-05 LAB — BASIC METABOLIC PANEL
BUN: 27 mg/dL — ABNORMAL HIGH (ref 6–23)
CHLORIDE: 97 meq/L (ref 96–112)
CO2: 30 mEq/L (ref 19–32)
Calcium: 8.6 mg/dL (ref 8.4–10.5)
Creatinine, Ser: 0.67 mg/dL (ref 0.50–1.35)
GFR calc non Af Amer: 87 mL/min — ABNORMAL LOW (ref >90)
Glucose, Bld: 121 mg/dL — ABNORMAL HIGH (ref 70–99)
Potassium: 4.2 mEq/L (ref 3.7–5.3)
Sodium: 139 mEq/L (ref 137–147)

## 2013-11-05 LAB — MAGNESIUM: MAGNESIUM: 1.7 mg/dL (ref 1.5–2.5)

## 2013-11-05 MED ORDER — PHENOL 1.4 % MT LIQD
1.0000 | OROMUCOSAL | Status: DC | PRN
  Administered 2013-11-05: 21:00:00 1 via OROMUCOSAL
  Filled 2013-11-05 (×2): qty 177

## 2013-11-05 MED ORDER — LEVOFLOXACIN 750 MG PO TABS
750.0000 mg | ORAL_TABLET | Freq: Every day | ORAL | Status: DC
  Administered 2013-11-05 – 2013-11-06 (×2): 750 mg via ORAL
  Filled 2013-11-05 (×3): qty 1

## 2013-11-05 NOTE — Evaluation (Signed)
Occupational Therapy Evaluation Patient Details Name: [name] [name] MRN: [id number] DOB: Apr 16, 1929 Today's Date: 11/05/2013 Time: 1443-1540 OT Time Calculation (min): 17 min  OT Assessment / Plan / Recommendation History of present illness With history of complete heart block status post permanent pacemaker, paroxysmal atrial fibrillation, hypertension, hyperlipidemia, coronary artery disease, COPD, rheumatoid arthritis, myasthenia gravis, BPH, history of alcohol abuse, history of cirrhosis of the liver, medical noncompliance who presents to the ED with a 2 week history of worsening shortness of breath, wheezing, productive cough of greenish yellowish sputum, paroxysmal nocturnal dyspnea, some lower extremity edema, some weight gain per patient. CHF   Clinical Impression   Pt is at Mod I - sup level with ADLs and ADL mobility. No further acute OT indicated at this time    OT Assessment  Patient does not need any further OT services    Follow Up Recommendations  No OT follow up    Barriers to Discharge  none    Equipment Recommendations  None recommended by OT    Recommendations for Other Services    Frequency       Precautions / Restrictions Precautions Precautions: Fall Restrictions Weight Bearing Restrictions: No   Pertinent Vitals/Pain 3/10 headache    ADL  Grooming: Performed;Wash/dry hands;Wash/dry face;Modified independent;Supervision/safety Upper Body Bathing: Simulated;Modified independent Lower Body Bathing: Simulated;Supervision/safety Upper Body Dressing: Performed;Modified independent Lower Body Dressing: Performed;Supervision/safety Toilet Transfer: Performed;Modified independent Toilet Transfer Method: Sit to Loss adjuster, chartered: Regular height toilet;Grab bars Toileting - Clothing Manipulation and Hygiene: Performed;Modified independent Where Assessed - Toileting Clothing Manipulation and Hygiene: Standing Tub/Shower Transfer:  Performed Tub/Shower Transfer Method: Therapist, art: Grab bars;Walk in shower    OT Diagnosis:    OT Problem List:   OT Treatment Interventions:     OT Goals(Current goals can be found in the care plan section) Acute Rehab OT Goals Patient Stated Goal: go home and back to work  Visit Information  Last OT Received On: 11/05/13 History of Present Illness: With history of complete heart block status post permanent pacemaker, paroxysmal atrial fibrillation, hypertension, hyperlipidemia, coronary artery disease, COPD, rheumatoid arthritis, myasthenia gravis, BPH, history of alcohol abuse, history of cirrhosis of the liver, medical noncompliance who presents to the ED with a 2 week history of worsening shortness of breath, wheezing, productive cough of greenish yellowish sputum, paroxysmal nocturnal dyspnea, some lower extremity edema, some weight gain per patient. 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 Dominant Hand: Right         Vision/Perception Vision - History Baseline Vision: Wears glasses only for reading Patient Visual Report: No change from baseline Perception Perception: Within Functional Limits   Cognition  Cognition Arousal/Alertness: Awake/alert Behavior During Therapy: WFL for tasks assessed/performed Overall Cognitive Status: Within Functional Limits for tasks assessed     Extremity/Trunk Assessment Upper Extremity Assessment Upper Extremity Assessment: Overall WFL for tasks assessed Lower Extremity Assessment Lower Extremity Assessment: Defer to PT evaluation Cervical / Trunk Assessment Cervical / Trunk Assessment: Normal  Mobility Bed Mobility General bed mobility comments: pt EOB on arrival Transfers Overall transfer level: Modified independent     Exercise     Balance Balance Overall balance assessment: History of Falls;No apparent balance deficits (not formally assessed)   End of Session OT - End of Session Activity Tolerance: Patient tolerated treatment well Patient left: in bed;Other (comment) (sitting EOB)  GO     [name] [facility] 11/05/2013, 12:08 PM

## 2013-11-05 NOTE — Progress Notes (Signed)
[facility] PROGRESS NOTE  Filed Weights   11/03/13 2052 11/04/13 0400 11/05/13 0547  Weight: 68.811 kg (151 lb 11.2 oz) 67.813 kg (149 lb 8 oz) 67.1 kg (147 lb 14.9 oz)        Intake/Output Summary (Last 24 hours) at 11/05/13 2134 Last data filed at 11/05/13 2122  Gross per 24 hour  Intake    723 ml  Output   2302 ml  Net  -1579 ml     Assessment/Plan: #1 acute resp failure: multifactorial -due to CHF exacerbation due to medication non compliance and also COPD exacerbation due to bronchitis/PNA. -will continue steroids, antibiotics, IV lasix -daily weight and strict I's and O's -Follow clinical response -patient breathing better. 5 pounds lighter  #2 probable community-acquired pneumonia versus bronchitis  Condition is present with a two-week history of shortness of breath with a productive cough.  -Chest x-ray with atelectasis versus pneumonia.  -continue tx with antibiotics.  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  -continue IV lasix, daily weight, strict I's and O's and daily BMET. improving. 5 pounds off and breathing better -will continue IV lasix  #4. acute COPD exacerbation  Continue PRN oxygen, nebulizer treatments, IV Levaquin and steroids. -since wheezing and breathing improving, will continue slowly tapering steroids.   #5 hypertension  Continue Aldactone, Lasix.  -stable.  #6 history of rheumatoid arthritis/myasthenia gravis  Stable. Imuran on hold secondary to problems #1 and 2. Follow with PCP and rheumatology as an outpatint.  #7 history of complete heart block status post PPM  Pacemaker has been interrogated and okay.  -monitoring on telemetry  #8 anxiety/depression  Stable. Continue lexapro   #9 atrial fibrillation  Stable. Currently rate controlled. Follow.   #10 gastroesophageal reflux disease   Continue PPI.   #11 history of alcohol abuse  Will continue PRN Ativan/withdrawal protocol.  -cessation counseling provided  #12 prophylaxis  PPI for GI prophylaxis. Lovenox for DVT prophylaxis.    Code Status: Full Family Communication: daughter at bedside Disposition Plan: home at discharge. Family planning to check on him for med compliance and encourage outpatient PT.   Consultants:  None   Procedures: ECHO: done on 08/2013 - Left ventricle: The cavity size was normal. There was mild concentric hypertrophy. Systolic function was mildly to moderately reduced. The estimated ejection fraction was in the range of 40% to 45%. Wall motion was normal; there were no regional wall motion abnormalities. Doppler parameters are consistent with a restrictive pattern, indicative of decreased left ventricular diastolic compliance and/or increased left atrial pressure (grade 3 diastolic dysfunction).  Antibiotics:  levaquin  HPI/Subjective: Breathing better, no fever, no CP. Reports some throat pain from coughing.  Objective: Filed Vitals:   11/05/13 1341 11/05/13 1842 11/05/13 2028 11/05/13 2128  BP: 101/46 124/57  133/69  Pulse: 60 59  61  Temp: 98.7 F (37.1 C)   98.3 F (36.8 C)  TempSrc: Oral   Oral  Resp: 18   18  Height:      Weight:      SpO2: 97%  95% 98%     Exam:  General: Alert, awake, oriented x3, in no acute distress; reports he is breathing better. Now just tired from PT work up.  HEENT: No bruits, no goiter; positive mild  JVD Heart: Regular rate and rhythm,  Negative murmurs, rubs, gallops.  Positive 1-2+ LE edema bilaterally  Lungs: improved air movement, positive mild exp wheezing and rhonchi; fine crackles at bases Abdomen: Soft, nontender, nondistended, positive bowel sounds.  Neuro: Grossly intact, nonfocal.   Data Reviewed: Basic Metabolic Panel:  Recent Labs Lab 11/03/13 1327 11/03/13 2125 11/04/13 0508 11/05/13 0509  NA 136*  --   137 139  K 4.3  --  4.5 4.2  CL 97  --  96 97  CO2 27  --  27 30  GLUCOSE 100*  --  197* 121*  BUN 14  --  19 27*  CREATININE 0.51  --  0.61 0.67  CALCIUM 8.9  --  8.6 8.6  MG  --  1.1*  --  1.7   Liver Function Tests: No results found for this basename: AST, ALT, ALKPHOS, BILITOT, PROT, ALBUMIN,  in the last 168 hours No results found for this basename: LIPASE, AMYLASE,  in the last 168 hours No results found for this basename: AMMONIA,  in the last 168 hours CBC:  Recent Labs Lab 11/03/13 1327  WBC 7.9  NEUTROABS 5.6  HGB 12.1*  HCT 34.3*  MCV 93.5  PLT 250   Cardiac Enzymes:  Recent Labs Lab 11/03/13 2125 11/04/13 0508 11/04/13 0733  TROPONINI <0.30 <0.30 <0.30   BNP (last 3 results)  Recent Labs  09/27/13 1541 11/03/13 1327 11/04/13 0508  PROBNP 584.0* 3761.0* 3390.0*    Studies: No results found.  Scheduled Meds: . aspirin EC  81 mg Oral Daily  . budesonide-formoterol  2 puff Inhalation BID  . docusate sodium  100 mg Oral Daily  . enoxaparin (LOVENOX) injection  40 mg Subcutaneous Q24H  . escitalopram  10 mg Oral Daily  . folic acid  1 mg Oral Daily  . furosemide  60 mg Intravenous BID  . levalbuterol  0.63 mg Nebulization Q6H  . levofloxacin  750 mg Oral QHS  . LORazepam  0-4 mg Oral Q12H  . multivitamin with minerals  1 tablet Oral Daily  . omega-3 acid ethyl esters  1 g Oral Daily  . pantoprazole  80 mg Oral Q1200  . potassium chloride SA  20 mEq Oral Daily  . predniSONE  40 mg Oral BID WC  . sodium chloride  3 mL Intravenous Q12H  . temazepam  15 mg Oral QHS  . thiamine  100 mg Oral Daily   Continuous Infusions:   Time: >30 minutes  Marylynn Rigdon  [facility] Pager [phone]. If 8PM-8AM, please contact night-coverage at [email or web address], password [facility] 11/05/2013, 9:34 PM  LOS: 2 days

## 2013-11-05 NOTE — Progress Notes (Addendum)
Report given to nightshift RN who denies any questions or concerns at this time.  Pt sitting on the edge of the bed watching TV and appears in no acute distress. Gae Gallop RN

## 2013-11-05 NOTE — Care Management Note (Addendum)
    Page 1 of 2   11/05/2013     1:51:44 PM   CARE MANAGEMENT NOTE 11/05/2013  Patient:  [facility] R   Account Number:  [id number]  Date Initiated:  11/04/2013  Documentation initiated by:  Mariann Laster  Subjective/Objective Assessment:   Admitted with CHF     Action/Plan:   CM will follow for disposition needs   Anticipated DC Date:  11/07/2013   Anticipated DC Plan:  [place]  CM consult      Choice offered to / List presented to:             Status of service:  Completed, signed off Medicare Important Message given?   (If response is "NO", the following Medicare IM given date fields will be blank) Date Medicare IM given:   Date Additional Medicare IM given:    Discharge Disposition:    Per UR Regulation:  Reviewed for med. necessity/level of care/duration of stay  If discussed at [place] of Stay Meetings, dates discussed:    Comments:  11/05/2013 CM met with patient to discuss order for OP PT services. Patient refused services stating he does not need and chops and moves wood daily to heat his home. Patient requested to know the balance owed on his [facility] account.  Patient states "I would like to know my balance and if [facility] is pleased with the way I am paying them back." CM has left VMM with Financial Advisor:  Holli Humbles [phone] for review and f/u. Crystal Hutchinson RN, BSN, MSHL, CCM 11/05/2012  11/05/2013 Social:  Patient lives home alone in [place] and works 1 day / week at [employer].  Patient is HOH.  Patient states he is independent with [facility] mgmt needs and ADLs. Patient refuses any asssitance in the home with care mgmt needs.  States he has never had any home oxygen and does not want any.  (DTR/Rebecca present during this CM consult meeting). Transportation:  Self / still driving Support System:  DTR, [name] lives close and checks on patient every few days. CM encouraged patient to discuss hearing screening test on next  PCP appt for possible need for hearing aid as patient has benefit with [employer]. ADD:  1-2+ Crystal Hutchinson RN, BSN, MSHL, CCM 11/05/2013

## 2013-11-06 LAB — BASIC METABOLIC PANEL
BUN: 34 mg/dL — ABNORMAL HIGH (ref 6–23)
CO2: 31 mEq/L (ref 19–32)
Calcium: 8.6 mg/dL (ref 8.4–10.5)
Chloride: 98 mEq/L (ref 96–112)
Creatinine, Ser: 0.68 mg/dL (ref 0.50–1.35)
GFR calc non Af Amer: 87 mL/min — ABNORMAL LOW (ref >90)
Glucose, Bld: 144 mg/dL — ABNORMAL HIGH (ref 70–99)
POTASSIUM: 4.3 meq/L (ref 3.7–5.3)
Sodium: 140 mEq/L (ref 137–147)

## 2013-11-06 MED ORDER — GUAIFENESIN ER 600 MG PO TB12
600.0000 mg | ORAL_TABLET | Freq: Two times a day (BID) | ORAL | Status: DC
  Administered 2013-11-06 – 2013-11-07 (×2): 600 mg via ORAL
  Filled 2013-11-06 (×3): qty 1

## 2013-11-06 MED ORDER — PREDNISONE 10 MG PO TABS
60.0000 mg | ORAL_TABLET | Freq: Every day | ORAL | Status: DC
  Administered 2013-11-07: 06:00:00 60 mg via ORAL
  Filled 2013-11-06 (×2): qty 1

## 2013-11-06 MED ORDER — FUROSEMIDE 40 MG PO TABS
40.0000 mg | ORAL_TABLET | Freq: Two times a day (BID) | ORAL | Status: DC
  Administered 2013-11-06 – 2013-11-07 (×3): 40 mg via ORAL
  Filled 2013-11-06 (×4): qty 1

## 2013-11-06 NOTE — Progress Notes (Signed)
[facility] PROGRESS NOTE  Filed Weights   11/04/13 0400 11/05/13 0547 11/06/13 0535  Weight: 67.813 kg (149 lb 8 oz) 67.1 kg (147 lb 14.9 oz) 61.1 kg (134 lb 11.2 oz)        Intake/Output Summary (Last 24 hours) at 11/06/13 1725 Last data filed at 11/06/13 1321  Gross per 24 hour  Intake    963 ml  Output   2900 ml  Net  -1937 ml     Assessment/Plan: #1 acute resp failure: multifactorial -due to CHF exacerbation 2/2 to medication non compliance and also COPD exacerbation due to bronchitis/PNA. -will continue steroids (tapering), antibiotics (PO) -continue lasix, but will transition to PO -continue daily weight and strict I's and O's -start antitussive  -patient breathing a lot better.  -15 pounds lighter since admission  #2 probable community-acquired pneumonia versus bronchitis  Condition is present with a two-week history of shortness of breath with a productive cough.  -Chest x-ray with atelectasis versus pneumonia.  -continue tx with antibiotics. 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  -significant improved -will switch to PO lasix -breathing better -will also continue aldactone  #4. acute COPD exacerbation  Continue PRN oxygen, nebulizer treatments, Levaquin and steroids. -since wheezing and breathing improving, will continue slowly tapering steroids.   #5 hypertension  Continue Aldactone, Lasix.  -stable.  #6 history of rheumatoid arthritis/myasthenia gravis  Stable. Imuran on hold secondary to problems #1 and 2. Follow with PCP and rheumatology as an outpatint.  #7 history of complete heart block status post PPM  Pacemaker has been interrogated and okay.  -continue monitoring on telemetry  #8 anxiety/depression  -Stable. Continue lexapro   #9 atrial fibrillation  -Stable. Currently rate controlled.  Follow.   #10 gastroesophageal reflux disease  Continue PPI.   #11 history of alcohol abuse  Will continue PRN Ativan/withdrawal protocol.  -cessation counseling provided  #12 prophylaxis  PPI for GI prophylaxis. Lovenox for DVT prophylaxis.    Code Status: Full Family Communication: daughter at bedside Disposition Plan: home at discharge. Family planning to check on him for med compliance and encourage outpatient PT.   Consultants:  None   Procedures: ECHO: done on 08/2013 - Left ventricle: The cavity size was normal. There was mild concentric hypertrophy. Systolic function was mildly to moderately reduced. The estimated ejection fraction was in the range of 40% to 45%. Wall motion was normal; there were no regional wall motion abnormalities. Doppler parameters are consistent with a restrictive pattern, indicative of decreased left ventricular diastolic compliance and/or increased left atrial pressure (grade 3 diastolic dysfunction).  Antibiotics:  levaquin  HPI/Subjective: Afebrile, no CP. Feeling a lot better  Objective: Filed Vitals:   11/06/13 0818 11/06/13 1049 11/06/13 1355 11/06/13 1402  BP:  129/65 109/52   Pulse:  60 60   Temp:   98.3 F (36.8 C)   TempSrc:   Oral   Resp:   20   Height:      Weight:      SpO2: 98%  98% 98%     Exam: General: Alert, awake, oriented x3, in no acute distress; reports he is breathing better. Now just tired from PT work up.  HEENT: No bruits, no goiter; mild JVD Heart: Regular rate and rhythm,  Negative murmurs, rubs, gallops.  Positive 1++ LE edema bilaterally  Lungs: improved air movement, positive mild  exp wheezing and rhonchi; no frank crackles at bases Abdomen: Soft, nontender, nondistended, positive bowel sounds.  Neuro: Grossly intact, nonfocal.  Data Reviewed: Basic Metabolic Panel:  Recent Labs Lab 11/03/13 1327 11/03/13 2125 11/04/13 0508 11/05/13 0509 11/06/13 0446  NA 136*  --  137 139 140  K  4.3  --  4.5 4.2 4.3  CL 97  --  96 97 98  CO2 27  --  27 30 31   GLUCOSE 100*  --  197* 121* 144*  BUN 14  --  19 27* 34*  CREATININE 0.51  --  0.61 0.67 0.68  CALCIUM 8.9  --  8.6 8.6 8.6  MG  --  1.1*  --  1.7  --    CBC:  Recent Labs Lab 11/03/13 1327  WBC 7.9  NEUTROABS 5.6  HGB 12.1*  HCT 34.3*  MCV 93.5  PLT 250   Cardiac Enzymes:  Recent Labs Lab 11/03/13 2125 11/04/13 0508 11/04/13 0733  TROPONINI <0.30 <0.30 <0.30   BNP (last 3 results)  Recent Labs  09/27/13 1541 11/03/13 1327 11/04/13 0508  PROBNP 584.0* 3761.0* 3390.0*    Studies: No results found.  Scheduled Meds: . aspirin EC  81 mg Oral Daily  . budesonide-formoterol  2 puff Inhalation BID  . docusate sodium  100 mg Oral Daily  . enoxaparin (LOVENOX) injection  40 mg Subcutaneous Q24H  . escitalopram  10 mg Oral Daily  . folic acid  1 mg Oral Daily  . furosemide  40 mg Oral BID  . guaiFENesin  600 mg Oral BID  . levalbuterol  0.63 mg Nebulization Q6H  . levofloxacin  750 mg Oral QHS  . LORazepam  0-4 mg Oral Q12H  . multivitamin with minerals  1 tablet Oral Daily  . omega-3 acid ethyl esters  1 g Oral Daily  . pantoprazole  80 mg Oral Q1200  . potassium chloride SA  20 mEq Oral Daily  . [START ON 11/07/2013] predniSONE  60 mg Oral Q breakfast  . sodium chloride  3 mL Intravenous Q12H  . temazepam  15 mg Oral QHS  . thiamine  100 mg Oral Daily   Continuous Infusions:   Time: >30 minutes  Zamora Colton  [facility] Pager [phone]. If 8PM-8AM, please contact night-coverage at [email or web address], password [facility] 11/06/2013, 5:25 PM  LOS: 3 days

## 2013-11-06 NOTE — Progress Notes (Signed)
Report given to receiving RN. Patient is sitting up on the side of the bed. No verbal complaints and signs or symptoms of distress or discomfort.

## 2013-11-06 NOTE — Progress Notes (Signed)
Patient complain of hip pain at "5" of "10".  Medicated with Percocet 2 tabs.

## 2013-11-07 LAB — PRO B NATRIURETIC PEPTIDE: PRO B NATRI PEPTIDE: 1005 pg/mL — AB (ref 0–450)

## 2013-11-07 MED ORDER — IPRATROPIUM-ALBUTEROL 20-100 MCG/ACT IN AERS: 1.0000 | INHALATION_SPRAY | Freq: Four times a day (QID) | RESPIRATORY_TRACT | Status: DC | PRN

## 2013-11-07 MED ORDER — LEVOFLOXACIN 750 MG PO TABS: 750.0000 mg | ORAL_TABLET | Freq: Every day | ORAL | Status: DC

## 2013-11-07 MED ORDER — GUAIFENESIN ER 600 MG PO TB12: 600.0000 mg | ORAL_TABLET | Freq: Two times a day (BID) | ORAL | Status: DC

## 2013-11-07 MED ORDER — AZATHIOPRINE 50 MG PO TABS: 75.0000 mg | ORAL_TABLET | Freq: Three times a day (TID) | ORAL | Status: DC

## 2013-11-07 MED ORDER — FUROSEMIDE 40 MG PO TABS: 60.0000 mg | ORAL_TABLET | Freq: Every day | ORAL | Status: DC

## 2013-11-07 MED ORDER — PREDNISONE 20 MG PO TABS: ORAL_TABLET | ORAL | Status: DC

## 2013-11-07 NOTE — Progress Notes (Signed)
Patient alert and oriented x4.  Patient states his shortness of breath is "much better" at rest and that he is ready to go home; states he has no questions or concerns at this time.    Patient discharged to home with family.  Patient's IV removed prior to discharge; IV site clean, dry, and intact.  Discharge teaching discussed with patient and patient's daughter.  Both patient and daughter voiced understanding of discharge instructions, education, and medications.  Patient instructed to follow up with primary care provider within next week as appointments cannot be scheduled over weekend.  Patient offered verbal agreement to this plan.

## 2013-11-07 NOTE — Discharge Summary (Signed)
Physician Discharge Summary  [name] [name] PJA:[id number] DOB: 1929/07/16 DOA: 11/03/2013  PCP: [name], MD  Admit date: 11/03/2013 Discharge date: 11/07/2013  Time spent: >30 minutes  Recommendations for Outpatient Follow-up:  1. Be made to follow renal function and electrolytes 2. Reassess blood pressure and adjust medications as needed  BNP    Component Value Date/Time   PROBNP 1005.0* 11/07/2013 0950   Filed Weights   11/05/13 0547 11/06/13 0535 11/07/13 3419  Weight: 67.1 kg (147 lb 14.9 oz) 61.1 kg (134 lb 11.2 oz) 62.5 kg (137 lb 12.6 oz)     Discharge Diagnoses:  Principal Problem:   Dyspnea Active Problems:   ANXIETY   DEPRESSION   GERD   ARTHRITIS, RHEUMATOID   Essential hypertension, benign   Complete heart block   Atrial fibrillation   CAD (coronary artery disease)   CHF (congestive heart failure)   CAP (community acquired pneumonia): Probable   COPD with acute exacerbation: Probable   Bronchitis: Probable   SOB (shortness of breath)   Discharge Condition: stable and improved. Discharge home. Patient will follow with PCP in 1 week.  Diet recommendation: heart healthy low sodium diet (less than 2 G daily)  History of present illness:  80 y.o. male  With history of complete heart block status post permanent pacemaker, paroxysmal atrial fibrillation, hypertension, hyperlipidemia, coronary artery disease, COPD, rheumatoid arthritis, myasthenia gravis, BPH, history of alcohol abuse, history of cirrhosis of the liver, medical noncompliance who presents to the ED with a 2 week history of worsening shortness of breath, wheezing, productive cough of greenish yellowish sputum, paroxysmal nocturnal dyspnea, some lower extremity edema, some weight gain per patient. Patient denies any fever, no chills, no nausea, no vomiting, no diarrhea, no dysuria. Patient does endorse occasional abdominal pain, constipation, weakness. Patient also endorses some chronic substernal  chest pain which has been ongoing for approximately 7 years. Patient also noted to have a history of falls.  Patient was seen in the emergency room basic metabolic profile obtain a sodium of 136 otherwise was within normal limits. Pro BNP was elevated at 3761. CBC with a hemoglobin of 12.1 otherwise was within normal limits.  Chest x-ray which was done showed a small right pleural effusion with right basilar atelectasis or infiltrate. No pulmonary edema.  Due to concern for possible pneumonia and wheezing patient was given a dose of IV Solu-Medrol as well as IV Rocephin and azithromycin. We were called to admit the patient for further evaluation and management.   Hospital Course:  #1 acute resp failure: multifactorial  -due to CHF exacerbation 2/2 to medication non compliance and also COPD exacerbation due to bronchitis/PNA.  -will continue steroids (tapering), antibiotics (PO)  -continue lasix PO 60mg  daily -follow up with PCP in 1 week -continue mucinex -15 pounds lighter at discharge and BNP 1005  #2 probable community-acquired pneumonia versus bronchitis  two-week history of shortness of breath with a productive cough.  -Chest x-ray with concerns for pneumonia.  -continue tx with antibiotics as instructed.  -Continue mucinex to facilitate expectoration  #3 acute on chronic diastolic and systolic CHF exacerbation  Likely secondary to medical noncompliance as patient states has not been taking his diuretics as prescribed.  -significant improved  -Excellent response to diuresis -Discharge Lasix 60 mg by mouth daily and instructions to follow a low-sodium diet -Patient will follow with PCP in 1 week for further medications arrangements and to follow his kidney function and electrolytes.  #4. acute COPD exacerbation  Continue  PRN oxygen, nebulizer treatments, Levaquin and steroids.  -since wheezing and breathing improving, will continue slowly tapering steroids.   #5 hypertension   -stable at discharge. Will continue home medication regimen   #6 history of rheumatoid arthritis/myasthenia gravis  Stable. Imuran on hold secondary to problems #1 and 2. Follow with PCP and rheumatology as an outpatint.   #7 history of complete heart block status post PPM  Pacemaker has been interrogated and okay.  -to follow with cardiology at discharge  #8 anxiety/depression  -Stable. Continue lexapro   #9 atrial fibrillation  -Stable. Currently rate controlled. Continue home regimen. Not a candidate for anticoagulation.  #10 gastroesophageal reflux disease  Continue PPI.   #11 history of alcohol abuse  -no withdrawal appreciated -cessation counseling provided   Procedures:  See below for x-ray reports   Consultations:  None   Discharge Exam: Filed Vitals:   11/07/13 0953  BP: 133/62  Pulse: 60  Temp:   Resp:    General: Alert, awake, oriented x3, in no acute distress; reports he is breathing better. Now just tired from PT work up.  HEENT: No bruits, no goiter; mild JVD  Heart: Regular rate and rhythm, Negative murmurs, rubs, gallops. Positive 1+ LE edema bilaterally  Lungs: improved air movement, positive mild exp wheezing and rhonchi; no frank crackles at bases  Abdomen: Soft, nontender, nondistended, positive bowel sounds.  Neuro: Grossly intact, nonfocal.   Discharge Instructions  Discharge Orders   Future Appointments Provider Department Dept Phone   12/20/2013 4:15 PM Peter M Martinique, MD [place] [facility] [phone]   Future Orders Complete By Expires   Diet - low sodium heart healthy  As directed    Discharge instructions  As directed    Comments:     Follow up with PCP in 1 week Take medications as prescribed Follow a low sodium diet (less than 2 Grams daily)       Medication List         aspirin EC 81 MG tablet  Take 81 mg by mouth daily.     azaTHIOprine 50 MG tablet  Commonly known as:  IMURAN  Take 1.5 tablets (75 mg  total) by mouth 3 (three) times daily. On hold until follow up with PCP/reumathology     budesonide-formoterol 160-4.5 MCG/ACT inhaler  Commonly known as:  SYMBICORT  Inhale 2 puffs into the lungs 2 (two) times daily.     docusate sodium 100 MG capsule  Commonly known as:  COLACE  Take 100 mg by mouth 2 (two) times daily as needed for constipation.     escitalopram 10 MG tablet  Commonly known as:  LEXAPRO  Take 10 mg by mouth daily.     esomeprazole 40 MG capsule  Commonly known as:  NEXIUM  Take 40 mg by mouth 2 (two) times daily before a meal.     FIBER PO  Take 1 tablet by mouth daily.     Fish Oil 1000 MG Caps  Take 1 capsule by mouth daily.     furosemide 40 MG tablet  Commonly known as:  LASIX  Take 1.5 tablets (60 mg total) by mouth daily.     guaiFENesin 600 MG 12 hr tablet  Commonly known as:  MUCINEX  Take 1 tablet (600 mg total) by mouth 2 (two) times daily.     Ipratropium-Albuterol 20-100 MCG/ACT Aers respimat  Commonly known as:  COMBIVENT  Inhale 1 puff into the lungs every 6 (six) hours  as needed for wheezing or shortness of breath.     levofloxacin 750 MG tablet  Commonly known as:  LEVAQUIN  Take 1 tablet (750 mg total) by mouth at bedtime.     oxyCODONE-acetaminophen 5-325 MG per tablet  Commonly known as:  PERCOCET/ROXICET  Take 1-2 tablets by mouth every 6 (six) hours as needed for moderate pain.     potassium chloride SA 20 MEQ tablet  Commonly known as:  K-DUR,KLOR-CON  Take 20 mEq by mouth daily. Take with lasix pill     predniSONE 20 MG tablet  Commonly known as:  DELTASONE  Take 2 tabs by mouth daily X 2 days; then 1 tab by mouth daily X 3 days; then 1/2 tablet by mouth daily X 3 days and stop prdenisone     temazepam 15 MG capsule  Commonly known as:  RESTORIL  Take 15 mg by mouth daily. For sleep     triamcinolone cream 0.1 %  Commonly known as:  KENALOG  Apply 1 application topically 2 (two) times daily as needed (to affected  area).       Allergies  Allergen Reactions  . Remicade [Infliximab] Other (See Comments)    Almost died  . Alendronate Sodium Other (See Comments)    Unknown.  . Benazepril Other (See Comments)    Unknown.  . Celebrex [Celecoxib] Nausea And Vomiting       Follow-up Information   Follow up with PERINI,MARK A, MD. Schedule an appointment as soon as possible for a visit in 1 week.   Specialty:  Internal Medicine   Contact information:   [place] [address] [phone]       The results of significant diagnostics from this hospitalization (including imaging, microbiology, ancillary and laboratory) are listed below for reference.    Significant Diagnostic Studies: Dg Chest 2 View  11/03/2013   CLINICAL DATA:  Chest pain, shortness of Breath  EXAM: CHEST  2 VIEW  COMPARISON:  12/27/2011  FINDINGS: Cardiomediastinal silhouette is stable. Dual lead cardiac pacemaker is unchanged in position. Stable calcified granuloma right lower lobe. There is small right pleural effusion with right basilar atelectasis or infiltrate. Mild degenerative changes thoracic spine. No pulmonary edema. Linear scarring in the lingula.  IMPRESSION: Small right pleural effusion with right basilar atelectasis or infiltrate. No pulmonary edema.   Electronically Signed   By: Lahoma Crocker M.D.   On: 11/03/2013 14:23   Dg Hip Complete Left  11/03/2013   CLINICAL DATA:  Chronic left hip pain  EXAM: LEFT HIP - COMPLETE 2+ VIEW  COMPARISON:  12/03/2008  FINDINGS: Three views of left hip submitted. Again noted left hip prosthesis in anatomic alignment. Again noted chronic fractured cerclage wires with a chronic bone fragment superior to femoral component without change from prior exam. No evidence of prosthesis loosening. Degenerative changes with diffuse narrowing of joint space nose is right hip joint. No acute fracture or subluxation.  IMPRESSION: No acute fracture or subluxation. Left hip prosthesis in  anatomic alignment. Degenerative changes right hip joint.   Electronically Signed   By: Lahoma Crocker M.D.   On: 11/03/2013 16:28   Labs: Basic Metabolic Panel:  Recent Labs Lab 11/03/13 1327 11/03/13 2125 11/04/13 0508 11/05/13 0509 11/06/13 0446  NA 136*  --  137 139 140  K 4.3  --  4.5 4.2 4.3  CL 97  --  96 97 98  CO2 27  --  27 30 31   GLUCOSE 100*  --  197* 121* 144*  BUN 14  --  19 27* 34*  CREATININE 0.51  --  0.61 0.67 0.68  CALCIUM 8.9  --  8.6 8.6 8.6  MG  --  1.1*  --  1.7  --    CBC:  Recent Labs Lab 11/03/13 1327  WBC 7.9  NEUTROABS 5.6  HGB 12.1*  HCT 34.3*  MCV 93.5  PLT 250   Cardiac Enzymes:  Recent Labs Lab 11/03/13 2125 11/04/13 0508 11/04/13 0733  TROPONINI <0.30 <0.30 <0.30   BNP: BNP (last 3 results)  Recent Labs  11/03/13 1327 11/04/13 0508 11/07/13 0950  PROBNP 3761.0* 3390.0* 1005.0*    Signed:  Eline Geng  [facility] 11/07/2013, 2:16 PM

## 2013-12-03 ENCOUNTER — Encounter: Payer: Self-pay | Admitting: Podiatrist

## 2013-12-03 ENCOUNTER — Ambulatory Visit (INDEPENDENT_AMBULATORY_CARE_PROVIDER_SITE_OTHER): Payer: Medicare Other | Admitting: Podiatrist

## 2013-12-03 ENCOUNTER — Other Ambulatory Visit: Payer: Self-pay | Admitting: Podiatrist

## 2013-12-03 VITALS — BP 156/96 | HR 86 | Resp 12

## 2013-12-03 DIAGNOSIS — L97509 Non-pressure chronic ulcer of other part of unspecified foot with unspecified severity: Secondary | ICD-10-CM

## 2013-12-03 MED ORDER — SILVER SULFADIAZINE 1 % EX CREA: 1.0000 "application " | TOPICAL_CREAM | Freq: Every day | CUTANEOUS | Status: DC

## 2013-12-03 NOTE — Progress Notes (Signed)
   Subjective:    Patient ID: [name] [name], male    DOB: 09/25/1929, 80 y.o.   MRN: [id number]  HPI PT STATED THAT RT FOOT IS SWOLLEN AND SORE UNDER THE FOOT FOR 2 YEARS. THE FOOT IS GETTING WORSE WHEN WALKING AND TRIED NO TREATMENT. ALSO TOENAILS NEED TO BE TRIM IF POSSIBLE.  Patient presents today for an ulcer on the right foot plantarly.  Patient relates it has been present for some time now and it hurts when walking.      Review of Systems  HENT: Positive for sinus pressure, sore throat and trouble swallowing.   Cardiovascular: Positive for chest pain and leg swelling.  Genitourinary: Positive for dysuria.  Musculoskeletal: Positive for back pain, gait problem, joint swelling and myalgias.  Skin: Positive for rash.  Neurological: Positive for dizziness, weakness and numbness.  Hematological: Bruises/bleeds easily.  All other systems reviewed and are negative.       Objective:   Physical Exam GENERAL APPEARANCE: Alert, conversant. Appropriately groomed. No acute distress.  VASCULAR: Pedal pulses palpable at 2/4 DP and PT bilateral.  Capillary refill time is immediate to all digits,   NEUROLOGIC: sensation is diminshed epicritically and protectively to 5.07 monofilament at 0/5 sites bilateral.  Light touch is decreased bilateral, vibratory sensationdecreased bilateral,.  MUSCULOSKELETAL: acceptable muscle strength, tone and stability bilateral.  DERMATOLOGIC: ulcer present plantar forefoot right.  No sign of infection present.  Red grannular base is seen and no pus, pirulence or malodor is noted.  Digital nails are symptommatic and mycotic bilateral        Assessment & Plan:  Ulcer  Plan:  Debridement of ulcer carried out today.  Cleansed and applied antibiotic ointment and a dressing.  Gave instructions for aftercare-- rx for silvadene.  He will be seen in 1 week for followup

## 2013-12-03 NOTE — Patient Instructions (Signed)
Instructions for Wound Care  The most important step to healing a foot wound is to reduce the pressure on your foot - it is extremely important to stay off your foot as much as possible and wear the shoe/boot as instructed.  Cleanse your foot with saline wash or warm soapy water (dial antibacterial soap or similar).  Blot dry.  Apply prescribed medication to your wound and cover with gauze and a bandage.  May hold bandage in place with Coban (self sticky wrap), Ace bandage or tape.  You may find dressing supplies at your local [employer], [employer], drug store or medical supply store.  Your prescribed topical medication is :  Silvadene cream to the foot once daily-- cover with a dressing   If you notice any foul odor, increase in pain, pus, increased swelling, red streaks or generalized redness occurring in your foot or leg-Call our office immediately to be seen.  This may be a sign of a limb or life threatening infection that will need prompt attention.  Trudie Buckler, [place]  [phone] [place] [facility]

## 2013-12-06 LAB — WOUND CULTURE: GRAM STAIN: NONE SEEN

## 2013-12-10 ENCOUNTER — Ambulatory Visit (INDEPENDENT_AMBULATORY_CARE_PROVIDER_SITE_OTHER): Payer: Medicare Other | Admitting: Podiatrist

## 2013-12-10 ENCOUNTER — Encounter: Payer: Self-pay | Admitting: Podiatrist

## 2013-12-10 VITALS — BP 111/64 | HR 76 | Resp 12

## 2013-12-10 DIAGNOSIS — L97509 Non-pressure chronic ulcer of other part of unspecified foot with unspecified severity: Secondary | ICD-10-CM

## 2013-12-10 NOTE — Progress Notes (Signed)
   Subjective: [name] [name] presents today for followup of ulceration submetatarsal 5 right foot. Patient states that the surgery shoe was uncomfortable and he's returning it. He states he's doing well overall.  Objective: Neurovascular status is unchanged with palpable pedal pulses at 2/4 DP and PT bilateral. Neurological sensation is decreased bilaterally epicriticaly and protectively. The ulceration present on the plantar forefoot right is healed at today's visit. No redness no swelling no signs of infection are present. Overall significant improvement is noted from previous visit.  Assessment: Healed ulceration submetatarsal 5 right foot  Plan: I gladly took back the surgery shoe as it was complementary in the first place due to his finances.  Recommended he keep a good eye on this foot however at today's visit it looks healed. He will continue with foot checks and shoe checks and he'll be seen back on as-needed basis.

## 2013-12-10 NOTE — Progress Notes (Deleted)
   Subjective:    Patient ID: [name] [name], male    DOB: 19-Jul-1929, 80 y.o.   MRN: [id number]  HPI Comments: PT STATED THE HOLE ON THE SHOE IS IN THE WRONG PLACE MADE THE FOOT HURT MORE. ALSO CANT DO ANYTHING AROUND OR GO ANY PLACES WEARING THE SHOES.     Review of Systems     Objective:   Physical Exam        Assessment & Plan:

## 2013-12-20 ENCOUNTER — Encounter: Payer: Self-pay | Admitting: Cardiology

## 2013-12-20 ENCOUNTER — Ambulatory Visit (INDEPENDENT_AMBULATORY_CARE_PROVIDER_SITE_OTHER): Payer: Medicare Other | Admitting: Cardiology

## 2013-12-20 VITALS — BP 143/64 | HR 68 | Ht 70.0 in | Wt 145.0 lb

## 2013-12-20 DIAGNOSIS — I509 Heart failure, unspecified: Secondary | ICD-10-CM

## 2013-12-20 DIAGNOSIS — I442 Atrioventricular block, complete: Secondary | ICD-10-CM

## 2013-12-20 DIAGNOSIS — I2510 Atherosclerotic heart disease of native coronary artery without angina pectoris: Secondary | ICD-10-CM

## 2013-12-20 DIAGNOSIS — R42 Dizziness and giddiness: Secondary | ICD-10-CM

## 2013-12-20 DIAGNOSIS — B029 Zoster without complications: Secondary | ICD-10-CM

## 2013-12-20 DIAGNOSIS — I4891 Unspecified atrial fibrillation: Secondary | ICD-10-CM

## 2013-12-20 DIAGNOSIS — I10 Essential (primary) hypertension: Secondary | ICD-10-CM

## 2013-12-20 MED ORDER — VALACYCLOVIR HCL 1 G PO TABS: 1000.0000 mg | ORAL_TABLET | Freq: Three times a day (TID) | ORAL | Status: DC

## 2013-12-20 NOTE — Progress Notes (Signed)
[name] [name] Date of Birth: 11-26-1928 Medical Record #[id number]  History of Present Illness: [name] is seen back today for a follow up visit.  He has CAD - managed medically, HTN, PAF but probably chronic AF, CHB with PPM in place, GERD, COPD, RA, myasthenia gravis, chronic pain, alcohol abuse and cirrhosis.  Most recently with acute systolic HF - started on Lasix by his PCP - had an echo and a Myoview. EF is 40 to 45%. Myoview was ok. Management difficult due to his initial refusal to take lasix. Also recently noted that he has had AF/flutter since November of 2013 - not on anticoagulation due to falls.   He was admitted to the hospital 11/03/13-11/07/13. Diuresed over 20 lbs with IV lasix. States he is taking 60 mg lasix daily now. Weight is staying down and edema is much better. No SOB. Doing a better job of watching his dietary sodium intake.   Last night he developed a rash along his right thigh. Multiple red bumps. No real pain.  Current Outpatient Prescriptions  Medication Sig Dispense Refill  . aspirin EC 81 MG tablet Take 81 mg by mouth daily.      Marland Kitchen azaTHIOprine (IMURAN) 50 MG tablet Take 1.5 tablets (75 mg total) by mouth 3 (three) times daily. On hold until follow up with PCP/reumathology      . budesonide-formoterol (SYMBICORT) 160-4.5 MCG/ACT inhaler Inhale 2 puffs into the lungs 2 (two) times daily.      Marland Kitchen docusate sodium (COLACE) 100 MG capsule Take 100 mg by mouth 2 (two) times daily as needed for constipation.      Marland Kitchen escitalopram (LEXAPRO) 10 MG tablet Take 10 mg by mouth daily.       Marland Kitchen esomeprazole (NEXIUM) 40 MG capsule Take 40 mg by mouth 2 (two) times daily before a meal.       . FIBER PO Take 1 tablet by mouth daily.      . finasteride (PROSCAR) 5 MG tablet       . furosemide (LASIX) 40 MG tablet Take 1.5 tablets (60 mg total) by mouth daily.  60 tablet  1  . Ipratropium-Albuterol (COMBIVENT) 20-100 MCG/ACT AERS respimat Inhale 1 puff into the lungs every 6 (six)  hours as needed for wheezing or shortness of breath.  1 Inhaler  1  . levofloxacin (LEVAQUIN) 750 MG tablet Take 1 tablet (750 mg total) by mouth at bedtime.  5 tablet  0  . morphine (MSIR) 15 MG tablet Take as directed      . Omega-3 Fatty Acids (FISH OIL) 1000 MG CAPS Take 1 capsule by mouth daily.      Marland Kitchen oxyCODONE-acetaminophen (PERCOCET/ROXICET) 5-325 MG per tablet Take 1-2 tablets by mouth every 6 (six) hours as needed for moderate pain.       . potassium chloride SA (K-DUR,KLOR-CON) 20 MEQ tablet Take 20 mEq by mouth daily. Take with lasix pill      . silver sulfADIAZINE (SILVADENE) 1 % cream Apply 1 application topically daily.  50 g  0  . temazepam (RESTORIL) 15 MG capsule Take 15 mg by mouth daily. For sleep      . triamcinolone cream (KENALOG) 0.1 % Apply 1 application topically 2 (two) times daily as needed (to affected area).       . valACYclovir (VALTREX) 1000 MG tablet Take 1 tablet (1,000 mg total) by mouth 3 (three) times daily.  21 tablet  0   No current facility-administered medications for  this visit.    Allergies  Allergen Reactions  . Remicade [Infliximab] Other (See Comments)    Almost died  . Alendronate Sodium Other (See Comments)    Unknown.  . Benazepril Other (See Comments)    Unknown.  . Celebrex [Celecoxib] Nausea And Vomiting    Past Medical History  Diagnosis Date  . Complete heart block     s/p PPM  . PAF (paroxysmal atrial fibrillation)   . HTN (hypertension)   . HLD (hyperlipidemia)   . CAD (coronary artery disease)   . Depression   . Anxiety   . Hemorrhoids, external   . GERD (gastroesophageal reflux disease)   . Hyperplastic colonic polyp   . Diverticulosis of colon   . COPD (chronic obstructive pulmonary disease)   . Myasthenia gravis   . Arthritis, rheumatoid   . BPH (benign prostatic hypertrophy)   . Cirrhosis of liver   . Asthma   . Alcohol abuse   . Hiatal hernia   . Skin cancer of forehead     and left ear  . Lung nodule   .  Nephrolithiasis   . CHF (congestive heart failure)   . Pacemaker   . Herpes zoster     Past Surgical History  Procedure Laterality Date  . Total knee arthroplasty      bilateral  . Total hip arthroplasty      x3  . Splenectomy    . Appendectomy    . Pacemaker insertion  2009    medtronic  . Cardiac catheterization  10/12/2007    EF 60%  . Inguinal hernia repair      History  Smoking status  . Former Smoker -- 4 years  . Types: Cigars  . Quit date: 10/22/1987  Smokeless tobacco  . Never Used    History  Alcohol Use  . 8.4 oz/week  . 14 Cans of beer per week    Family History  Problem Relation Age of Onset  . Myasthenia gravis Mother   . Esophageal cancer Father   . HIV Son     Aids  . Diabetes Son     Review of Systems: The review of systems is per the HPI.  All other systems were reviewed and are negative.  Physical Exam: BP 143/64  Pulse 68  Ht 5\' 10"  (1.778 m)  Wt 145 lb (65.772 kg)  BMI 20.81 kg/m2 Patient is alert and in no acute distress.  Color is normal.  HEENT is unremarkable. Normocephalic/atraumatic. PERRL. Sclera are nonicteric. Neck is supple. No masses. No JVD. Lungs are clear. Cardiac exam shows a fairly regular rate and rhythm (presumed paced). Abdomen is soft. Extremities are firm with 1-2+ edema - much improved. Scrotal swelling resolved.Gait and ROM are intact. No gross neurologic deficits noted. Skin reveals multiple Macular/vesicular lesions in clusters being in the right buttocks area and around the right thigh anteriorly. This is in a dermasomal distribution.  Wt Readings from Last 3 Encounters:  12/20/13 145 lb (65.772 kg)  11/07/13 137 lb 12.6 oz (62.5 kg)  10/25/13 166 lb 1.9 oz (75.352 kg)     LABORATORY DATA:    Chemistry      Component Value Date/Time   NA 140 11/06/2013 0446   K 4.3 11/06/2013 0446   CL 98 11/06/2013 0446   CO2 31 11/06/2013 0446   BUN 34* 11/06/2013 0446   CREATININE 0.68 11/06/2013 0446       Component Value Date/Time   CALCIUM 8.6 11/06/2013  0446   ALKPHOS 73 09/27/2013 1541   AST 27 09/27/2013 1541   ALT 16 09/27/2013 1541   BILITOT 0.6 09/27/2013 1541     Lab Results  Component Value Date   WBC 7.9 11/03/2013   HGB 12.1* 11/03/2013   HCT 34.3* 11/03/2013   MCV 93.5 11/03/2013   PLT 250 11/03/2013   No results found for this basename: CHOL,  HDL,  LDLCALC,  LDLDIRECT,  TRIG,  CHOLHDL     Assessment / Plan: 1. Systolic HF - EF of 40 to 45% per echo and 48% per Myoview - no ischemia on Myoview . Significant improvement post hospitalization for IV diuresis. Compliant with diuretic therapy for now. Continue sodium restriction. He reports he had lab work with Dr. Joylene Draft since DC. I will follow up in 2 months.  2. Probable chronic atrial fib - not a candidate for anticoagulation.   3. CHB - s/p PPM - followed by Dr. Rayann Heman - consider changing to VVIR on return check   4. CAD - managed medically   5. Rash. Consistent with Herpes Zoster outbreak. Will treat with Valtrex 1 gram tid for 7 days.   I will see him back in a couple of months.

## 2013-12-20 NOTE — Patient Instructions (Signed)
Take valtrex 1 gram three times a day for one week.  Continue your other therapy  I will see you in 2 months.

## 2014-02-16 ENCOUNTER — Encounter: Payer: Self-pay | Admitting: Cardiology

## 2014-03-10 ENCOUNTER — Ambulatory Visit (INDEPENDENT_AMBULATORY_CARE_PROVIDER_SITE_OTHER): Payer: Medicare Other | Admitting: Cardiology

## 2014-03-10 ENCOUNTER — Encounter: Payer: Self-pay | Admitting: Cardiology

## 2014-03-10 VITALS — BP 130/62 | HR 58 | Ht 70.0 in | Wt 152.0 lb

## 2014-03-10 DIAGNOSIS — I509 Heart failure, unspecified: Secondary | ICD-10-CM

## 2014-03-10 DIAGNOSIS — I4891 Unspecified atrial fibrillation: Secondary | ICD-10-CM

## 2014-03-10 DIAGNOSIS — I442 Atrioventricular block, complete: Secondary | ICD-10-CM

## 2014-03-10 DIAGNOSIS — I5042 Chronic combined systolic (congestive) and diastolic (congestive) heart failure: Secondary | ICD-10-CM | POA: Insufficient documentation

## 2014-03-10 DIAGNOSIS — I5022 Chronic systolic (congestive) heart failure: Secondary | ICD-10-CM

## 2014-03-10 DIAGNOSIS — I2510 Atherosclerotic heart disease of native coronary artery without angina pectoris: Secondary | ICD-10-CM

## 2014-03-10 NOTE — Progress Notes (Signed)
[name] [name] Date of Birth: 1929/09/14 Medical Record #[id number]  History of Present Illness: [name] [name] is seen back today for a follow up visit.  He has CAD - managed medically, HTN,  chronic AF, CHB with PPM in place, GERD, COPD, RA, myasthenia gravis, chronic pain, alcohol abuse and cirrhosis.  He has a history of systolic HF - prior Echo showed EF is 40 to 45%. Myoview was ok. Management difficult due to his initial refusal to take lasix. Also recently noted that he has had AF/flutter since November of 2013 - not on anticoagulation due to falls.   He was admitted to the hospital 11/03/13-11/07/13. Diuresed over 20 lbs with IV lasix. Was taking 60 mg lasix daily before. Complains that the diuretic effect keeps him tied to the bathroom so he reduced his dose to 40 mg every other day. Now he has increased swelling and weight gain of 7 lbs. He denies dyspnea. His severe arthritis causes constant pain. He takes oxycontin 4 times a day. Sometimes he feels groggy or sleepy.  Current Outpatient Prescriptions  Medication Sig Dispense Refill  . aspirin EC 81 MG tablet Take 81 mg by mouth daily.      Marland Kitchen augmented betamethasone dipropionate (DIPROLENE-AF) 0.05 % ointment       . azaTHIOprine (IMURAN) 50 MG tablet Take 1.5 tablets (75 mg total) by mouth 3 (three) times daily. On hold until follow up with PCP/reumathology      . budesonide-formoterol (SYMBICORT) 160-4.5 MCG/ACT inhaler Inhale 2 puffs into the lungs 2 (two) times daily.      Marland Kitchen docusate sodium (COLACE) 100 MG capsule Take 100 mg by mouth 2 (two) times daily as needed for constipation.      Marland Kitchen escitalopram (LEXAPRO) 10 MG tablet Take 10 mg by mouth daily.       Marland Kitchen esomeprazole (NEXIUM) 40 MG capsule Take 40 mg by mouth 2 (two) times daily before a meal.       . FIBER PO Take 1 tablet by mouth daily.      . finasteride (PROSCAR) 5 MG tablet       . furosemide (LASIX) 40 MG tablet Take 1.5 tablets (60 mg total) by mouth daily.  60 tablet  1   . Ipratropium-Albuterol (COMBIVENT) 20-100 MCG/ACT AERS respimat Inhale 1 puff into the lungs every 6 (six) hours as needed for wheezing or shortness of breath.  1 Inhaler  1  . morphine (MSIR) 15 MG tablet Take as directed      . Omega-3 Fatty Acids (FISH OIL) 1000 MG CAPS Take 1 capsule by mouth daily.      Marland Kitchen oxyCODONE-acetaminophen (PERCOCET/ROXICET) 5-325 MG per tablet Take 1-2 tablets by mouth every 6 (six) hours as needed for moderate pain.       . potassium chloride SA (K-DUR,KLOR-CON) 20 MEQ tablet Take 20 mEq by mouth daily. Take with lasix pill      . silver sulfADIAZINE (SILVADENE) 1 % cream Apply 1 application topically daily.  50 g  0  . spironolactone (ALDACTONE) 50 MG tablet       . temazepam (RESTORIL) 15 MG capsule Take 15 mg by mouth daily. For sleep      . triamcinolone cream (KENALOG) 0.1 % Apply 1 application topically 2 (two) times daily as needed (to affected area).       . valACYclovir (VALTREX) 1000 MG tablet Take 1 tablet (1,000 mg total) by mouth 3 (three) times daily.  21 tablet  0  No current facility-administered medications for this visit.    Allergies  Allergen Reactions  . Remicade [Infliximab] Other (See Comments)    Almost died  . Alendronate Sodium Other (See Comments)    Unknown.  . Benazepril Other (See Comments)    Unknown.  . Celebrex [Celecoxib] Nausea And Vomiting    Past Medical History  Diagnosis Date  . Complete heart block     s/p PPM  . PAF (paroxysmal atrial fibrillation)   . HTN (hypertension)   . HLD (hyperlipidemia)   . CAD (coronary artery disease)   . Depression   . Anxiety   . Hemorrhoids, external   . GERD (gastroesophageal reflux disease)   . Hyperplastic colonic polyp   . Diverticulosis of colon   . COPD (chronic obstructive pulmonary disease)   . Myasthenia gravis   . Arthritis, rheumatoid   . BPH (benign prostatic hypertrophy)   . Cirrhosis of liver   . Asthma   . Alcohol abuse   . Hiatal hernia   . Skin  cancer of forehead     and left ear  . Lung nodule   . Nephrolithiasis   . CHF (congestive heart failure)   . Pacemaker   . Herpes zoster     Past Surgical History  Procedure Laterality Date  . Total knee arthroplasty      bilateral  . Total hip arthroplasty      x3  . Splenectomy    . Appendectomy    . Pacemaker insertion  2009    medtronic  . Cardiac catheterization  10/12/2007    EF 60%  . Inguinal hernia repair      History  Smoking status  . Former Smoker -- 31 years  . Types: Cigars  . Quit date: 10/22/1987  Smokeless tobacco  . Never Used    History  Alcohol Use  . 8.4 oz/week  . 14 Cans of beer per week    Family History  Problem Relation Age of Onset  . Myasthenia gravis Mother   . Esophageal cancer Father   . HIV Son     Aids  . Diabetes Son     Review of Systems: The review of systems is per the HPI.  All other systems were reviewed and are negative.  Physical Exam: BP 130/62  Pulse 58  Ht 5\' 10"  (1.778 m)  Wt 152 lb (68.947 kg)  BMI 21.81 kg/m2 Patient is alert and in no acute distress.  Color is normal.  HEENT is unremarkable. Normocephalic/atraumatic. PERRL. Sclera are nonicteric. Neck is supple. No masses. No JVD. Lungs are clear. Cardiac exam shows a fairly regular rate and rhythm (presumed paced). Abdomen is soft. Extremities are firm with 2-3+ edema - below the knee mostly. Gait and ROM are intact. No gross neurologic deficits noted.   Wt Readings from Last 3 Encounters:  03/10/14 152 lb (68.947 kg)  12/20/13 145 lb (65.772 kg)  11/07/13 137 lb 12.6 oz (62.5 kg)     LABORATORY DATA:    Chemistry      Component Value Date/Time   NA 140 11/06/2013 0446   K 4.3 11/06/2013 0446   CL 98 11/06/2013 0446   CO2 31 11/06/2013 0446   BUN 34* 11/06/2013 0446   CREATININE 0.68 11/06/2013 0446      Component Value Date/Time   CALCIUM 8.6 11/06/2013 0446   ALKPHOS 73 09/27/2013 1541   AST 27 09/27/2013 1541   ALT 16 09/27/2013 1541  BILITOT 0.6 09/27/2013 1541     Lab Results  Component Value Date   WBC 7.9 11/03/2013   HGB 12.1* 11/03/2013   HCT 34.3* 11/03/2013   MCV 93.5 11/03/2013   PLT 250 11/03/2013   No results found for this basename: CHOL,  HDL,  LDLCALC,  LDLDIRECT,  TRIG,  CHOLHDL     Assessment / Plan: 1. Acute on chronic Systolic HF - EF of 40 to 45% per echo and 48% per Myoview - no ischemia on Myoview . Significant weight gain since hospitalization in Jan. Explained need for daily lasix. Will increase dose back to 60 mg daily. Continue sodium restriction. If he doesn't notice improvement he needs to call me back.   2. Probable chronic atrial fib - not a candidate for anticoagulation.   3. CHB - s/p PPM - followed by Dr. Rayann Heman - consider changing to VVIR on return check if still in AFib.  4. CAD - managed medically   5. RA  6. Hepatic cirrhosis.

## 2014-03-10 NOTE — Patient Instructions (Signed)
You need to go back up on your furosemide to 1 and a half tablet daily. 60 mg   Continue your other therapy.  I will see you in 3 months.

## 2014-03-23 ENCOUNTER — Ambulatory Visit (INDEPENDENT_AMBULATORY_CARE_PROVIDER_SITE_OTHER): Payer: Medicare Other | Admitting: Internal Medicine

## 2014-03-23 ENCOUNTER — Encounter: Payer: Self-pay | Admitting: Internal Medicine

## 2014-03-23 VITALS — BP 142/78 | HR 68 | Ht 70.0 in | Wt 146.4 lb

## 2014-03-23 DIAGNOSIS — I2510 Atherosclerotic heart disease of native coronary artery without angina pectoris: Secondary | ICD-10-CM

## 2014-03-23 DIAGNOSIS — I442 Atrioventricular block, complete: Secondary | ICD-10-CM

## 2014-03-23 DIAGNOSIS — I4891 Unspecified atrial fibrillation: Secondary | ICD-10-CM

## 2014-03-23 LAB — MDC_IDC_ENUM_SESS_TYPE_INCLINIC
Brady Statistic RV Percent Paced: 100 %
Date Time Interrogation Session: 20150603171330
Lead Channel Impedance Value: 457 Ohm
Lead Channel Pacing Threshold Pulse Width: 0.4 ms
Lead Channel Sensing Intrinsic Amplitude: 4 mV
MDC IDC MSMT BATTERY IMPEDANCE: 390 Ohm
MDC IDC MSMT BATTERY REMAINING LONGEVITY: 89 mo
MDC IDC MSMT BATTERY VOLTAGE: 2.78 V
MDC IDC MSMT LEADCHNL RA IMPEDANCE VALUE: 495 Ohm
MDC IDC MSMT LEADCHNL RA SENSING INTR AMPL: 2.8 mV
MDC IDC MSMT LEADCHNL RV PACING THRESHOLD AMPLITUDE: 0.5 V
MDC IDC SET LEADCHNL RV PACING AMPLITUDE: 2.5 V
MDC IDC SET LEADCHNL RV PACING PULSEWIDTH: 0.4 ms
MDC IDC SET LEADCHNL RV SENSING SENSITIVITY: 2 mV

## 2014-03-23 NOTE — Patient Instructions (Signed)
Remote monitoring is used to monitor your pacemaker from home. This monitoring reduces the number of office visits required to check your device to one time per year. It allows [place] to keep an eye on the functioning of your device to ensure it is working properly. You are scheduled for a device check from home on 06-28-2014. You may send your transmission at any time that day. If you have a wireless device, the transmission will be sent automatically. After your physician reviews your transmission, you will receive a postcard with your next transmission date.  Your physician recommends that you schedule a follow-up appointment in: 12 months with Dr.Allred

## 2014-03-23 NOTE — Progress Notes (Signed)
PCP: [name], MD  [name] [name] is a 80 y.o. male who presents today for routine electrophysiology followup.  Since last being seen in our clinic, the patient reports doing very well.  He has some difficulty with CHF for which Dr Martinique is trying to manage with lasix.  He has been reluctant to take lasix.  Today, he denies symptoms of palpitations,shortness of breath,   dizziness, presyncope, or syncope.  He does have some edema.   The patient is otherwise without complaint today.   Past Medical History  Diagnosis Date  . Complete heart block     s/p PPM  . PAF (paroxysmal atrial fibrillation)   . HTN (hypertension)   . HLD (hyperlipidemia)   . CAD (coronary artery disease)   . Depression   . Anxiety   . Hemorrhoids, external   . GERD (gastroesophageal reflux disease)   . Hyperplastic colonic polyp   . Diverticulosis of colon   . COPD (chronic obstructive pulmonary disease)   . Myasthenia gravis   . Arthritis, rheumatoid   . BPH (benign prostatic hypertrophy)   . Cirrhosis of liver   . Asthma   . Alcohol abuse   . Hiatal hernia   . Skin cancer of forehead     and left ear  . Lung nodule   . Nephrolithiasis   . CHF (congestive heart failure)   . Pacemaker   . Herpes zoster    Past Surgical History  Procedure Laterality Date  . Total knee arthroplasty      bilateral  . Total hip arthroplasty      x3  . Splenectomy    . Appendectomy    . Pacemaker insertion  2009    medtronic  . Cardiac catheterization  10/12/2007    EF 60%  . Inguinal hernia repair      Current Outpatient Prescriptions  Medication Sig Dispense Refill  . aspirin EC 81 MG tablet Take 81 mg by mouth daily.      Marland Kitchen augmented betamethasone dipropionate (DIPROLENE-AF) 0.05 % ointment Apply topically as directed.       Marland Kitchen azaTHIOprine (IMURAN) 50 MG tablet Take 75 mg by mouth 3 (three) times daily.      . budesonide-formoterol (SYMBICORT) 160-4.5 MCG/ACT inhaler Inhale 2 puffs into the lungs 2 (two)  times daily.      Marland Kitchen docusate sodium (COLACE) 100 MG capsule Take 100 mg by mouth 2 (two) times daily as needed for constipation.      Marland Kitchen escitalopram (LEXAPRO) 10 MG tablet Take 10 mg by mouth daily.       Marland Kitchen esomeprazole (NEXIUM) 40 MG capsule Take 40 mg by mouth 2 (two) times daily before a meal.       . FIBER PO Take 1 tablet by mouth daily.      . finasteride (PROSCAR) 5 MG tablet Take 5 mg by mouth daily.       . furosemide (LASIX) 40 MG tablet Take 1.5 tablets (60 mg total) by mouth daily.  60 tablet  1  . Ipratropium-Albuterol (COMBIVENT) 20-100 MCG/ACT AERS respimat Inhale 1 puff into the lungs every 6 (six) hours as needed for wheezing or shortness of breath.  1 Inhaler  1  . morphine (MSIR) 15 MG tablet Take 45 mg by mouth as needed. Take as directed      . Omega-3 Fatty Acids (FISH OIL) 1000 MG CAPS Take 1 capsule by mouth daily.      Marland Kitchen oxyCODONE-acetaminophen (PERCOCET/ROXICET)  5-325 MG per tablet Take 1-2 tablets by mouth every 6 (six) hours as needed for moderate pain.       . potassium chloride SA (K-DUR,KLOR-CON) 20 MEQ tablet Take 20 mEq by mouth daily. Take with lasix pill      . silver sulfADIAZINE (SILVADENE) 1 % cream Apply 1 application topically daily.  50 g  0  . spironolactone (ALDACTONE) 50 MG tablet Take 50 mg by mouth daily.       . temazepam (RESTORIL) 15 MG capsule Take 15 mg by mouth daily. For sleep      . triamcinolone cream (KENALOG) 0.1 % Apply 1 application topically 2 (two) times daily as needed (to affected area).       . valACYclovir (VALTREX) 1000 MG tablet Take 1 tablet (1,000 mg total) by mouth 3 (three) times daily.  21 tablet  0   No current facility-administered medications for this visit.    Physical Exam: Filed Vitals:   03/23/14 1626  BP: 142/78  Pulse: 68  Height: 5\' 10"  (1.778 m)  Weight: 146 lb 6.4 oz (66.407 kg)    GEN- The patient is chronically ill appearing, alert and oriented x 3 today.   Head- normocephalic, atraumatic Eyes-   Sclera clear, conjunctiva pink Ears- hearing intact Oropharynx- clear Lungs- prolonged expiratory phase, Heart- Regular rate and rhythm,   GI- soft, NT, ND, + BS Extremities- no clubbing, cyanosis, +1edema  Pacemaker interrogation reviewed  Assessment and Plan:  1. afib Permanent afib/ atrial fluter No felt to be a candidate for anticoagulation  2. Complete heart block Normal pacemaker function See Pace Art report Programmed VVIR today  carelink Return to see Johney Frame the device clinic in 1 year

## 2014-03-29 ENCOUNTER — Other Ambulatory Visit: Payer: Self-pay | Admitting: Dermatology

## 2014-06-13 ENCOUNTER — Ambulatory Visit: Payer: Medicare Other | Admitting: Cardiology

## 2014-06-28 ENCOUNTER — Ambulatory Visit (INDEPENDENT_AMBULATORY_CARE_PROVIDER_SITE_OTHER): Payer: Medicare Other | Admitting: *Deleted

## 2014-06-28 ENCOUNTER — Telehealth: Payer: Self-pay | Admitting: Cardiology

## 2014-06-28 DIAGNOSIS — I442 Atrioventricular block, complete: Secondary | ICD-10-CM

## 2014-06-28 NOTE — Telephone Encounter (Signed)
LMOVM reminding pt to send remote transmission.   

## 2014-06-30 ENCOUNTER — Telehealth: Payer: Self-pay | Admitting: Cardiology

## 2014-06-30 ENCOUNTER — Encounter: Payer: Self-pay | Admitting: Internal Medicine

## 2014-06-30 DIAGNOSIS — I442 Atrioventricular block, complete: Secondary | ICD-10-CM

## 2014-06-30 NOTE — Telephone Encounter (Signed)
Instructed pt how to hook up monitor. Pt verbalized understanding.

## 2014-06-30 NOTE — Progress Notes (Signed)
Remote pacemaker transmission.   

## 2014-07-01 LAB — MDC_IDC_ENUM_SESS_TYPE_REMOTE
Battery Impedance: 438 Ohm
Battery Voltage: 2.78 V
Brady Statistic RV Percent Paced: 100 %
Lead Channel Impedance Value: 67 Ohm
Lead Channel Pacing Threshold Pulse Width: 0.4 ms
Lead Channel Setting Pacing Amplitude: 2.5 V
Lead Channel Setting Sensing Sensitivity: 2 mV
MDC IDC MSMT BATTERY REMAINING LONGEVITY: 95 mo
MDC IDC MSMT LEADCHNL RV IMPEDANCE VALUE: 460 Ohm
MDC IDC MSMT LEADCHNL RV PACING THRESHOLD AMPLITUDE: 0.75 V
MDC IDC SESS DTM: 20150910124021
MDC IDC SET LEADCHNL RV PACING PULSEWIDTH: 0.4 ms

## 2014-07-06 ENCOUNTER — Encounter: Payer: Self-pay | Admitting: Cardiology

## 2014-08-18 ENCOUNTER — Ambulatory Visit (INDEPENDENT_AMBULATORY_CARE_PROVIDER_SITE_OTHER): Payer: Medicare Other | Admitting: Cardiology

## 2014-08-18 ENCOUNTER — Encounter: Payer: Self-pay | Admitting: Cardiology

## 2014-08-18 VITALS — BP 126/60 | HR 64 | Ht 70.0 in | Wt 158.0 lb

## 2014-08-18 DIAGNOSIS — I482 Chronic atrial fibrillation, unspecified: Secondary | ICD-10-CM

## 2014-08-18 DIAGNOSIS — I5022 Chronic systolic (congestive) heart failure: Secondary | ICD-10-CM

## 2014-08-18 DIAGNOSIS — I442 Atrioventricular block, complete: Secondary | ICD-10-CM

## 2014-08-18 DIAGNOSIS — I2510 Atherosclerotic heart disease of native coronary artery without angina pectoris: Secondary | ICD-10-CM

## 2014-08-18 NOTE — Progress Notes (Signed)
[name] [name] Date of Birth: 17-Apr-1929 Medical Record #[id number]  History of Present Illness: [name] [name] is seen back today for a follow up visit.  He has CAD - managed medically, HTN,  chronic AF, CHB with PPM in place, GERD, COPD, RA, myasthenia gravis, chronic pain, history of alcohol abuse and cirrhosis.  He has a history of systolic HF - prior Echo showed EF is 40 to 45%. Myoview was ok. Management difficult due to his initial refusal to take lasix. Also recently noted that he has had AF/flutter since November of 2013 - not on anticoagulation due to falls.   Since his hospitalization in January 2015 he has been compliant with lasix 20 mg daily. Has chronic leg edema particularly on the right with some weeping. Wears support socks. Breathing is at baseline. Really doesn't like taking lasix since it ties him to the restroom. He had his pacemaker reprogrammed to VVI mode in September since he is in chronic Afib.  Current Outpatient Prescriptions  Medication Sig Dispense Refill  . aspirin EC 81 MG tablet Take 81 mg by mouth daily.      Marland Kitchen augmented betamethasone dipropionate (DIPROLENE-AF) 0.05 % ointment Apply topically as directed.       Marland Kitchen azaTHIOprine (IMURAN) 50 MG tablet Take 75 mg by mouth 3 (three) times daily.      . budesonide-formoterol (SYMBICORT) 160-4.5 MCG/ACT inhaler Inhale 2 puffs into the lungs 2 (two) times daily.      Marland Kitchen docusate sodium (COLACE) 100 MG capsule Take 100 mg by mouth 2 (two) times daily as needed for constipation.      Marland Kitchen escitalopram (LEXAPRO) 10 MG tablet Take 20 mg by mouth daily.       Marland Kitchen esomeprazole (NEXIUM) 40 MG capsule Take 40 mg by mouth 2 (two) times daily before a meal.       . FIBER PO Take 1 tablet by mouth daily.      . finasteride (PROSCAR) 5 MG tablet Take 5 mg by mouth daily.       . furosemide (LASIX) 40 MG tablet Take 1.5 tablets (60 mg total) by mouth daily.  60 tablet  1  . Ipratropium-Albuterol (COMBIVENT) 20-100 MCG/ACT AERS respimat  Inhale 1 puff into the lungs every 6 (six) hours as needed for wheezing or shortness of breath.  1 Inhaler  1  . morphine (MSIR) 15 MG tablet Take 45 mg by mouth as needed. Take as directed      . Omega-3 Fatty Acids (FISH OIL) 1000 MG CAPS Take 1 capsule by mouth daily.      Marland Kitchen oxyCODONE-acetaminophen (PERCOCET/ROXICET) 5-325 MG per tablet Take 1-2 tablets by mouth every 6 (six) hours as needed for moderate pain.       . potassium chloride SA (K-DUR,KLOR-CON) 20 MEQ tablet Take 20 mEq by mouth daily. Take with lasix pill      . silver sulfADIAZINE (SILVADENE) 1 % cream Apply 1 application topically daily as needed.      Marland Kitchen spironolactone (ALDACTONE) 50 MG tablet Take 25 mg by mouth daily.       . temazepam (RESTORIL) 15 MG capsule Take 15 mg by mouth daily. For sleep      . triamcinolone cream (KENALOG) 0.1 % Apply 1 application topically 2 (two) times daily as needed (to affected area).       . valACYclovir (VALTREX) 1000 MG tablet Take 1 tablet (1,000 mg total) by mouth 3 (three) times daily.  21 tablet  0   No current facility-administered medications for this visit.    Allergies  Allergen Reactions  . Remicade [Infliximab] Other (See Comments)    Almost died  . Alendronate Sodium Other (See Comments)    Unknown.  . Benazepril Other (See Comments)    Unknown.  . Celebrex [Celecoxib] Nausea And Vomiting    Past Medical History  Diagnosis Date  . Complete heart block     s/p PPM  . PAF (paroxysmal atrial fibrillation)   . HTN (hypertension)   . HLD (hyperlipidemia)   . CAD (coronary artery disease)   . Depression   . Anxiety   . Hemorrhoids, external   . GERD (gastroesophageal reflux disease)   . Hyperplastic colonic polyp   . Diverticulosis of colon   . COPD (chronic obstructive pulmonary disease)   . Myasthenia gravis   . Arthritis, rheumatoid   . BPH (benign prostatic hypertrophy)   . Cirrhosis of liver   . Asthma   . Alcohol abuse   . Hiatal hernia   . Skin cancer  of forehead     and left ear  . Lung nodule   . Nephrolithiasis   . CHF (congestive heart failure)   . Pacemaker   . Herpes zoster     Past Surgical History  Procedure Laterality Date  . Total knee arthroplasty      bilateral  . Total hip arthroplasty      x3  . Splenectomy    . Appendectomy    . Pacemaker insertion  2009    medtronic  . Cardiac catheterization  10/12/2007    EF 60%  . Inguinal hernia repair      History  Smoking status  . Former Smoker -- 53 years  . Types: Cigars  . Quit date: 10/22/1987  Smokeless tobacco  . Never Used    History  Alcohol Use  . 8.4 oz/week  . 14 Cans of beer per week    Family History  Problem Relation Age of Onset  . Myasthenia gravis Mother   . Esophageal cancer Father   . HIV Son     Aids  . Diabetes Son     Review of Systems: The review of systems is per the HPI.  All other systems were reviewed and are negative.  Physical Exam: BP 126/60  Pulse 64  Ht 5\' 10"  (1.778 m)  Wt 158 lb (71.668 kg)  BMI 22.67 kg/m2 Patient is alert and in no acute distress.  Color is normal.  HEENT is unremarkable. Normocephalic/atraumatic. PERRL. Sclera are nonicteric. Neck is supple. No masses. No JVD. Lungs are clear. Cardiac exam shows a  regular rate and rhythm. No gallop. Abdomen is soft. Extremities are firm with 2+ edema - below the knee mostly. Some weeping on the right. Gait and ROM are intact. No gross neurologic deficits noted.   Wt Readings from Last 3 Encounters:  08/18/14 158 lb (71.668 kg)  03/23/14 146 lb 6.4 oz (66.407 kg)  03/10/14 152 lb (68.947 kg)     LABORATORY DATA:    Chemistry      Component Value Date/Time   NA 140 11/06/2013 0446   K 4.3 11/06/2013 0446   CL 98 11/06/2013 0446   CO2 31 11/06/2013 0446   BUN 34* 11/06/2013 0446   CREATININE 0.68 11/06/2013 0446      Component Value Date/Time   CALCIUM 8.6 11/06/2013 0446   ALKPHOS 73 09/27/2013 1541   AST 27 09/27/2013 1541  ALT 16 09/27/2013 1541    BILITOT 0.6 09/27/2013 1541     Lab Results  Component Value Date   WBC 7.9 11/03/2013   HGB 12.1* 11/03/2013   HCT 34.3* 11/03/2013   MCV 93.5 11/03/2013   PLT 250 11/03/2013   No results found for this basename: CHOL,  HDL,  LDLCALC,  LDLDIRECT,  TRIG,  CHOLHDL   Ecg today shows V paced rhythm. Rate 64 bpm. Underlying Afib. I have personally reviewed and interpreted this study.   Assessment / Plan: 1. Acute on chronic Systolic HF - EF of 40 to 45% per echo and 48% per Myoview - no ischemia on Myoview . Reinforced need to take lasix.   Continue sodium restriction. I will follow up in 6 months. Labs are followed by Dr. Joylene Draft.   2. Chronic atrial fib - not a candidate for anticoagulation.   3. CHB - s/p PPM - followed by Dr. Rayann Heman - now in VVI mode  4. CAD - managed medically   5. RA  6. Hepatic cirrhosis.

## 2014-08-18 NOTE — Patient Instructions (Signed)
Continue your current therapy  I will see you in 6 months.   

## 2014-08-24 ENCOUNTER — Other Ambulatory Visit: Payer: Self-pay | Admitting: Dermatology

## 2014-09-21 ENCOUNTER — Ambulatory Visit (INDEPENDENT_AMBULATORY_CARE_PROVIDER_SITE_OTHER): Payer: Medicare Other | Admitting: Physician Assistant

## 2014-09-21 ENCOUNTER — Other Ambulatory Visit (INDEPENDENT_AMBULATORY_CARE_PROVIDER_SITE_OTHER): Payer: Medicare Other

## 2014-09-21 ENCOUNTER — Encounter (HOSPITAL_COMMUNITY): Payer: Self-pay | Admitting: *Deleted

## 2014-09-21 ENCOUNTER — Encounter: Payer: Self-pay | Admitting: Physician Assistant

## 2014-09-21 VITALS — BP 146/72 | HR 84 | Ht 70.0 in | Wt 155.5 lb

## 2014-09-21 DIAGNOSIS — I2510 Atherosclerotic heart disease of native coronary artery without angina pectoris: Secondary | ICD-10-CM

## 2014-09-21 DIAGNOSIS — R1310 Dysphagia, unspecified: Secondary | ICD-10-CM

## 2014-09-21 DIAGNOSIS — K7469 Other cirrhosis of liver: Secondary | ICD-10-CM

## 2014-09-21 LAB — CBC WITH DIFFERENTIAL/PLATELET
Basophils Relative: 0 % (ref 0.0–3.0)
EOS PCT: 4 % (ref 0.0–5.0)
HCT: 34.2 % — ABNORMAL LOW (ref 39.0–52.0)
HEMOGLOBIN: 11.1 g/dL — AB (ref 13.0–17.0)
LYMPHS PCT: 8 % — AB (ref 12.0–46.0)
MCHC: 32.5 g/dL (ref 30.0–36.0)
MCV: 102.4 fl — ABNORMAL HIGH (ref 78.0–100.0)
MONOS PCT: 20 % — AB (ref 3.0–12.0)
Neutrophils Relative %: 68 % (ref 43.0–77.0)
Platelets: 300 10*3/uL (ref 150.0–400.0)
RBC: 3.34 Mil/uL — AB (ref 4.22–5.81)
RDW: 17.8 % — ABNORMAL HIGH (ref 11.5–15.5)
WBC: 6.3 10*3/uL (ref 4.0–10.5)

## 2014-09-21 LAB — PROTIME-INR
INR: 1.1 ratio — ABNORMAL HIGH (ref 0.8–1.0)
Prothrombin Time: 12.1 s (ref 9.6–13.1)

## 2014-09-21 NOTE — Patient Instructions (Signed)
You have been scheduled for an endoscopy. Please follow written instructions given to you at your visit today. If you use inhalers (even only as needed), please bring them with you on the day of your procedure. Your physician has requested that you go to [email or web address] and enter the access code given to you at your visit today. This web site gives a general overview about your procedure. However, you should still follow specific instructions given to you by our office regarding your preparation for the procedure.  Your physician has requested that you go to the basement for the following lab work before leaving today: CBC/diff, PT/INR  (Lab came up and drew blood)  I appreciate the opportunity to care for you.

## 2014-09-21 NOTE — Progress Notes (Signed)
Patient ID: [name] [name], male   DOB: 1929/02/14, 80 y.o.   MRN: [id number]     History of Present Illness:  [name] [name] is an 80 year old male referred for evaluation by Dr. Crist Infante due to dysphagia.  [name] [name] has a history of cirrhosis and ascites and had an upper endoscopy in April 2013 with Dr. Fuller Plan that showed portal gastropathy with no esophageal or gastric varices. He has a history of COPD, severe rheumatoid arthritis, myasthenia gravis, macrocytic anemia, he is status post pacemaker placement, hypertension, complete heart block, atrial fibrillation, coronary artery disease, congestive heart failure, asthma, GERD, alcohol abuse, skin cancer of the 4 head and left ear, lung nodule, nephrolithiasis, and herpes zoster. He has had bilateral knee replacements, Hip replacement, splenectomy, appendectomy, pacemaker insertion, cardiac catheterization, and inguinal hernia repair.  [name] [name] reports that for the past 8-10 days he has had difficulty swallowing solids and liquids. When this initially started 8-10 days ago he couldn't swallow nothing for a couple of days. Since then he has started crushing his pills and melting them in warm water and sipping on water throughout the day to maintain hydration he reports that he is unable to swallow any solids at all. Review of old records shows that he had an upper endoscopy by Dr. Lorine Bears in 1993 that showed early stricture formation. He had mild chronic reflux esophagitis, mild gastritis and mild duodenitis. He had a repeat upper endoscopy in August 2001 that showed a sliding hiatal hernia with reflux esophagitis the stomach had gastritis and there was again duodenitis there was a stricture that was dilated with a #52 Maloney dilator. He was positive for H. pylori and was treated.   Past Medical History  Diagnosis Date  . Complete heart block     s/p PPM  . PAF (paroxysmal atrial fibrillation)   . HTN (hypertension)   . HLD (hyperlipidemia)   . CAD (coronary  artery disease)   . Depression   . Anxiety   . Hemorrhoids, external   . GERD (gastroesophageal reflux disease)   . Hyperplastic colonic polyp   . Diverticulosis of colon   . COPD (chronic obstructive pulmonary disease)   . Myasthenia gravis   . Arthritis, rheumatoid   . BPH (benign prostatic hypertrophy)   . Cirrhosis of liver   . Asthma   . Alcohol abuse   . Hiatal hernia   . Skin cancer of forehead     and left ear  . Lung nodule   . Nephrolithiasis   . CHF (congestive heart failure)   . Pacemaker   . Herpes zoster     Past Surgical History  Procedure Laterality Date  . Total knee arthroplasty      bilateral  . Total hip arthroplasty      x3  . Splenectomy    . Appendectomy    . Pacemaker insertion  2009    medtronic  . Cardiac catheterization  10/12/2007    EF 60%  . Inguinal hernia repair     Family History  Problem Relation Age of Onset  . Myasthenia gravis Mother   . Esophageal cancer Father   . HIV Son     Aids  . Diabetes Son   . Heart disease Maternal Uncle   . Colon cancer Other     Cousin on Mother's side  . Colon polyps Neg Hx    History  Substance Use Topics  . Smoking status: Former Smoker -- 40 years    Types:  Cigars    Quit date: 10/22/1987  . Smokeless tobacco: Never Used  . Alcohol Use: 8.4 oz/week    14 Cans of beer per week   Current Outpatient Prescriptions  Medication Sig Dispense Refill  . aspirin EC 81 MG tablet Take 81 mg by mouth daily.    Marland Kitchen augmented betamethasone dipropionate (DIPROLENE-AF) 0.05 % ointment Apply topically as directed.     Marland Kitchen azaTHIOprine (IMURAN) 50 MG tablet Take 75 mg by mouth 3 (three) times daily.    . budesonide-formoterol (SYMBICORT) 160-4.5 MCG/ACT inhaler Inhale 2 puffs into the lungs 2 (two) times daily.    Marland Kitchen docusate sodium (COLACE) 100 MG capsule Take 100 mg by mouth 2 (two) times daily as needed for constipation.    Marland Kitchen escitalopram (LEXAPRO) 20 MG tablet Take 20 mg by mouth daily.     Marland Kitchen  esomeprazole (NEXIUM) 40 MG capsule Take 40 mg by mouth 2 (two) times daily before a meal.     . FIBER PO Take 1 tablet by mouth daily.    . finasteride (PROSCAR) 5 MG tablet Take 5 mg by mouth daily.     . furosemide (LASIX) 40 MG tablet Take 1.5 tablets (60 mg total) by mouth daily. 60 tablet 1  . Ipratropium-Albuterol (COMBIVENT) 20-100 MCG/ACT AERS respimat Inhale 1 puff into the lungs every 6 (six) hours as needed for wheezing or shortness of breath. 1 Inhaler 1  . morphine (MSIR) 15 MG tablet Take 45 mg by mouth as needed. Take as directed    . Omega-3 Fatty Acids (FISH OIL) 1000 MG CAPS Take 1 capsule by mouth daily.    Marland Kitchen oxyCODONE-acetaminophen (PERCOCET/ROXICET) 5-325 MG per tablet Take 1-2 tablets by mouth every 6 (six) hours as needed for moderate pain.     . potassium chloride SA (K-DUR,KLOR-CON) 20 MEQ tablet Take 20 mEq by mouth daily. Take with lasix pill    . silver sulfADIAZINE (SILVADENE) 1 % cream Apply 1 application topically daily as needed.    Marland Kitchen spironolactone (ALDACTONE) 50 MG tablet Take 25 mg by mouth daily.     . temazepam (RESTORIL) 15 MG capsule Take 15 mg by mouth daily. For sleep    . triamcinolone cream (KENALOG) 0.1 % Apply 1 application topically 2 (two) times daily as needed (to affected area).     . valACYclovir (VALTREX) 1000 MG tablet Take 1 tablet (1,000 mg total) by mouth 3 (three) times daily. 21 tablet 0   No current facility-administered medications for this visit.   Allergies  Allergen Reactions  . Remicade [Infliximab] Other (See Comments)    Almost died  . Alendronate Sodium Other (See Comments)    Unknown.  . Benazepril Other (See Comments)    Unknown.  . Celebrex [Celecoxib] Nausea And Vomiting      Review of Systems: Gen: Denies any fever, chills, sweats, anorexia, fatigue, weakness, malaise, weight loss, and sleep disorder CV: Denies chest pain, angina, palpitations, syncope, orthopnea, PND, peripheral edema, and claudication. Resp:  Denies dyspnea at rest, dyspnea with exercise, cough, sputum, wheezing, coughing up blood, and pleurisy. GI: Denies vomiting blood, jaundice, and fecal incontinence.   GU : Denies urinary burning, blood in urine, urinary frequency, urinary hesitancy, nocturnal urination, and urinary incontinence. MS:Has severe RA Derm: Denies rash, itching, dry skin, hives, moles, warts, or unhealing ulcers.  Psych: Denies depression, anxiety, memory loss, suicidal ideation, hallucinations, paranoia, and confusion. Heme: Denies bruising, bleeding, and enlarged lymph nodes. Neuro:  Denies any headaches, dizziness, paresthesia  Endo:  Denies any problems with DM, thyroid, adrenal  LAB RESULTS:  Recent Labs  09/21/14 1259  WBC 6.3  HGB 11.1*  HCT 34.2*  PLT 300.0    Recent Labs  09/21/14 1259  LABPROT 12.1  INR 1.1*       Physical Exam: General: Pleasant, well developed ,male in no acute distress, scar on right chin from recent dermatologic procedure. Head: Normocephalic and atraumatic Eyes:  sclerae anicteric, conjunctiva pink  Ears: Normal auditory acuity Lungs: Clear throughout to auscultation Heart: Regular rate and rhythm Abdomen: Soft, non distended, non-tender. No masses, no hepatomegaly. Normal bowel sounds Musculoskeletal: Symmetrical with no gross deformities  Extremities: No edema  Neurological: Alert oriented x 4, grossly nonfocal Psychological:  Alert and cooperative. Normal mood and affect  Assessment and Recommendations: 80 year old male with an 8-10 day history of dysphagia to solids and liquids referred for evaluation. The patient has a long-standing history of reflux and has required dilations of esophageal strictures in the past. He will be scheduled for an EGD with possible dilation.The risks, benefits, and alternatives to endoscopy with possible biopsy and possible dilation were discussed with the patient and they consent to proceed. The procedure will be scheduled with  Dr. Fuller Plan. CBC and PT/INR will be obtained today to be sure the patient has no clotting issues in light of his history of cirrhosis.    Jakyrie Totherow, Vita Barley PA-C 09/21/2014,

## 2014-09-21 NOTE — Progress Notes (Signed)
When interviewing patient over the phone for history questions, patient states it is not possible for someone to stay with him or he stay with someone after the procedure and he needs to be ready to go home from procedure at 930 am.

## 2014-09-21 NOTE — Progress Notes (Signed)
Reviewed and agree with management plan.  Derk Doubek T. Mily Malecki, MD FACG 

## 2014-09-23 ENCOUNTER — Ambulatory Visit (HOSPITAL_COMMUNITY)
Admission: RE | Admit: 2014-09-23 | Discharge: 2014-09-23 | Disposition: A | Discharge status: Home / Self Care | Payer: Medicare Other | Source: Ambulatory Visit | Attending: Gastroenterology | Admitting: Gastroenterology

## 2014-09-23 ENCOUNTER — Encounter (HOSPITAL_COMMUNITY): Payer: Self-pay | Admitting: Anesthesiology

## 2014-09-23 ENCOUNTER — Encounter (HOSPITAL_COMMUNITY)
Admission: RE | Disposition: A | Discharge status: Home / Self Care | Payer: Medicare Other | Source: Ambulatory Visit | Attending: Gastroenterology

## 2014-09-23 ENCOUNTER — Encounter (HOSPITAL_COMMUNITY): Payer: Medicare Other | Admitting: Anesthesiology

## 2014-09-23 ENCOUNTER — Encounter (HOSPITAL_COMMUNITY): Payer: Self-pay | Admitting: Gastroenterology

## 2014-09-23 DIAGNOSIS — K7469 Other cirrhosis of liver: Secondary | ICD-10-CM

## 2014-09-23 DIAGNOSIS — K2100 Gastro-esophageal reflux disease with esophagitis, without bleeding: Secondary | ICD-10-CM

## 2014-09-23 DIAGNOSIS — K222 Esophageal obstruction: Secondary | ICD-10-CM | POA: Diagnosis not present

## 2014-09-23 DIAGNOSIS — I442 Atrioventricular block, complete: Secondary | ICD-10-CM | POA: Insufficient documentation

## 2014-09-23 DIAGNOSIS — K209 Esophagitis, unspecified: Secondary | ICD-10-CM | POA: Insufficient documentation

## 2014-09-23 DIAGNOSIS — Z87891 Personal history of nicotine dependence: Secondary | ICD-10-CM | POA: Diagnosis not present

## 2014-09-23 DIAGNOSIS — Z950 Presence of cardiac pacemaker: Secondary | ICD-10-CM | POA: Insufficient documentation

## 2014-09-23 DIAGNOSIS — Z85828 Personal history of other malignant neoplasm of skin: Secondary | ICD-10-CM | POA: Insufficient documentation

## 2014-09-23 DIAGNOSIS — I10 Essential (primary) hypertension: Secondary | ICD-10-CM | POA: Diagnosis not present

## 2014-09-23 DIAGNOSIS — I2510 Atherosclerotic heart disease of native coronary artery without angina pectoris: Secondary | ICD-10-CM | POA: Insufficient documentation

## 2014-09-23 DIAGNOSIS — R1310 Dysphagia, unspecified: Secondary | ICD-10-CM | POA: Diagnosis present

## 2014-09-23 DIAGNOSIS — K7460 Unspecified cirrhosis of liver: Secondary | ICD-10-CM | POA: Insufficient documentation

## 2014-09-23 DIAGNOSIS — K449 Diaphragmatic hernia without obstruction or gangrene: Secondary | ICD-10-CM | POA: Insufficient documentation

## 2014-09-23 DIAGNOSIS — M069 Rheumatoid arthritis, unspecified: Secondary | ICD-10-CM | POA: Insufficient documentation

## 2014-09-23 DIAGNOSIS — F329 Major depressive disorder, single episode, unspecified: Secondary | ICD-10-CM | POA: Insufficient documentation

## 2014-09-23 DIAGNOSIS — N400 Enlarged prostate without lower urinary tract symptoms: Secondary | ICD-10-CM | POA: Insufficient documentation

## 2014-09-23 DIAGNOSIS — F1010 Alcohol abuse, uncomplicated: Secondary | ICD-10-CM | POA: Insufficient documentation

## 2014-09-23 DIAGNOSIS — E785 Hyperlipidemia, unspecified: Secondary | ICD-10-CM | POA: Insufficient documentation

## 2014-09-23 DIAGNOSIS — F419 Anxiety disorder, unspecified: Secondary | ICD-10-CM | POA: Insufficient documentation

## 2014-09-23 DIAGNOSIS — J449 Chronic obstructive pulmonary disease, unspecified: Secondary | ICD-10-CM | POA: Diagnosis not present

## 2014-09-23 HISTORY — PX: SAVORY DILATION: SHX5439

## 2014-09-23 HISTORY — PX: ESOPHAGOGASTRODUODENOSCOPY: SHX5428

## 2014-09-23 SURGERY — EGD (ESOPHAGOGASTRODUODENOSCOPY)
Anesthesia: Monitor Anesthesia Care

## 2014-09-23 MED ORDER — PROPOFOL 10 MG/ML IV BOLUS
INTRAVENOUS | Status: AC
  Filled 2014-09-23: qty 20

## 2014-09-23 MED ORDER — LIDOCAINE VISCOUS 2 % MT SOLN
20.0000 mL | OROMUCOSAL | Status: DC | PRN
Start: 2014-09-23 — End: 2015-06-15

## 2014-09-23 MED ORDER — SODIUM CHLORIDE 0.9 % IV SOLN: INTRAVENOUS | Status: DC

## 2014-09-23 NOTE — Interval H&P Note (Signed)
History and Physical Interval Note:  09/23/2014 8:12 AM  [name] [name]  has presented today for surgery, with the diagnosis of dysphagia  The various methods of treatment have been discussed with the patient and family. After consideration of risks, benefits and other options for treatment, the patient has consented to  Procedure(s): ESOPHAGOGASTRODUODENOSCOPY (EGD) (N/A) SAVORY DILATION (N/A) as a surgical intervention .  The patient's history has been reviewed, patient examined, no change in status, stable for surgery.  I have reviewed the patient's chart and labs.  Questions were answered to the patient's satisfaction.     Pricilla Riffle. Fuller Plan MD

## 2014-09-23 NOTE — H&P (View-Only) (Signed)
Patient ID: [name], male   DOB: [birth year], 80 y.o.   MRN: [id number]     History of Present Illness:  [name] [name] is an 80 year old male referred for evaluation by Dr. Crist [name] due to dysphagia.  [name] [name] has a history of cirrhosis and ascites and had an upper endoscopy in April 2013 with Dr. Fuller [name] that showed portal gastropathy with no esophageal or gastric varices. He has a history of COPD, severe rheumatoid arthritis, myasthenia gravis, macrocytic anemia, he is status post pacemaker placement, hypertension, complete heart block, atrial fibrillation, coronary artery disease, congestive heart failure, asthma, GERD, alcohol abuse, skin cancer of the 4 head and left ear, lung nodule, nephrolithiasis, and herpes zoster. He has had bilateral knee replacements, Hip replacement, splenectomy, appendectomy, pacemaker insertion, cardiac catheterization, and inguinal hernia repair.  [name] [name] reports that for the past 8-10 days he has had difficulty swallowing solids and liquids. When this initially started 8-10 days ago he couldn't swallow nothing for a couple of days. Since then he has started crushing his pills and melting them in warm water and sipping on water throughout the day to maintain hydration he reports that he is unable to swallow any solids at all. Review of old records shows that he had an upper endoscopy by Dr. Lorine [name] in 1993 that showed early stricture formation. He had mild chronic reflux esophagitis, mild gastritis and mild duodenitis. He had a repeat upper endoscopy in August 2001 that showed a sliding hiatal hernia with reflux esophagitis the stomach had gastritis and there was again duodenitis there was a stricture that was dilated with a #52 Maloney dilator. He was positive for H. pylori and was treated.   Past Medical History  Diagnosis Date  . Complete heart block     s/p PPM  . PAF (paroxysmal atrial fibrillation)   . HTN (hypertension)   . HLD (hyperlipidemia)   . CAD (coronary  artery disease)   . Depression   . Anxiety   . Hemorrhoids, external   . GERD (gastroesophageal reflux disease)   . Hyperplastic colonic polyp   . Diverticulosis of colon   . COPD (chronic obstructive pulmonary disease)   . Myasthenia gravis   . Arthritis, rheumatoid   . BPH (benign prostatic hypertrophy)   . Cirrhosis of liver   . Asthma   . Alcohol abuse   . Hiatal hernia   . Skin cancer of forehead     and left ear  . Lung nodule   . Nephrolithiasis   . CHF (congestive heart failure)   . Pacemaker   . Herpes zoster     Past Surgical History  Procedure Laterality Date  . Total knee arthroplasty      bilateral  . Total hip arthroplasty      x3  . Splenectomy    . Appendectomy    . Pacemaker insertion  2009    medtronic  . Cardiac catheterization  10/12/2007    EF 60%  . Inguinal hernia repair     Family History  Problem Relation Age of Onset  . Myasthenia gravis Mother   . Esophageal cancer Father   . HIV Son     Aids  . Diabetes Son   . Heart disease Maternal Uncle   . Colon cancer Other     Cousin on Mother's side  . Colon polyps Neg Hx    History  Substance Use Topics  . Smoking status: Former Smoker -- 40 years    Types:  Cigars    Quit date: 10/22/1987  . Smokeless tobacco: Never Used  . Alcohol Use: 8.4 oz/week    14 Cans of beer per week   Current Outpatient Prescriptions  Medication Sig Dispense Refill  . aspirin EC 81 MG tablet Take 81 mg by mouth daily.    Marland Kitchen augmented betamethasone dipropionate (DIPROLENE-AF) 0.05 % ointment Apply topically as directed.     Marland Kitchen azaTHIOprine (IMURAN) 50 MG tablet Take 75 mg by mouth 3 (three) times daily.    . budesonide-formoterol (SYMBICORT) 160-4.5 MCG/ACT inhaler Inhale 2 puffs into the lungs 2 (two) times daily.    Marland Kitchen docusate sodium (COLACE) 100 MG capsule Take 100 mg by mouth 2 (two) times daily as needed for constipation.    Marland Kitchen escitalopram (LEXAPRO) 20 MG tablet Take 20 mg by mouth daily.     Marland Kitchen  esomeprazole (NEXIUM) 40 MG capsule Take 40 mg by mouth 2 (two) times daily before a meal.     . FIBER PO Take 1 tablet by mouth daily.    . finasteride (PROSCAR) 5 MG tablet Take 5 mg by mouth daily.     . furosemide (LASIX) 40 MG tablet Take 1.5 tablets (60 mg total) by mouth daily. 60 tablet 1  . Ipratropium-Albuterol (COMBIVENT) 20-100 MCG/ACT AERS respimat Inhale 1 puff into the lungs every 6 (six) hours as needed for wheezing or shortness of breath. 1 Inhaler 1  . morphine (MSIR) 15 MG tablet Take 45 mg by mouth as needed. Take as directed    . Omega-3 Fatty Acids (FISH OIL) 1000 MG CAPS Take 1 capsule by mouth daily.    Marland Kitchen oxyCODONE-acetaminophen (PERCOCET/ROXICET) 5-325 MG per tablet Take 1-2 tablets by mouth every 6 (six) hours as needed for moderate pain.     . potassium chloride SA (K-DUR,KLOR-CON) 20 MEQ tablet Take 20 mEq by mouth daily. Take with lasix pill    . silver sulfADIAZINE (SILVADENE) 1 % cream Apply 1 application topically daily as needed.    Marland Kitchen spironolactone (ALDACTONE) 50 MG tablet Take 25 mg by mouth daily.     . temazepam (RESTORIL) 15 MG capsule Take 15 mg by mouth daily. For sleep    . triamcinolone cream (KENALOG) 0.1 % Apply 1 application topically 2 (two) times daily as needed (to affected area).     . valACYclovir (VALTREX) 1000 MG tablet Take 1 tablet (1,000 mg total) by mouth 3 (three) times daily. 21 tablet 0   No current facility-administered medications for this visit.   Allergies  Allergen Reactions  . Remicade [Infliximab] Other (See Comments)    Almost died  . Alendronate Sodium Other (See Comments)    Unknown.  . Benazepril Other (See Comments)    Unknown.  . Celebrex [Celecoxib] Nausea And Vomiting      Review of Systems: Gen: Denies any fever, chills, sweats, anorexia, fatigue, weakness, malaise, weight loss, and sleep disorder CV: Denies chest pain, angina, palpitations, syncope, orthopnea, PND, peripheral edema, and claudication. Resp:  Denies dyspnea at rest, dyspnea with exercise, cough, sputum, wheezing, coughing up blood, and pleurisy. GI: Denies vomiting blood, jaundice, and fecal incontinence.   GU : Denies urinary burning, blood in urine, urinary frequency, urinary hesitancy, nocturnal urination, and urinary incontinence. MS:Has severe RA Derm: Denies rash, itching, dry skin, hives, moles, warts, or unhealing ulcers.  Psych: Denies depression, anxiety, memory loss, suicidal ideation, hallucinations, paranoia, and confusion. Heme: Denies bruising, bleeding, and enlarged lymph nodes. Neuro:  Denies any headaches, dizziness, paresthesia  Endo:  Denies any problems with DM, thyroid, adrenal  LAB RESULTS:  Recent Labs  09/21/14 1259  WBC 6.3  HGB 11.1*  HCT 34.2*  PLT 300.0    Recent Labs  09/21/14 1259  LABPROT 12.1  INR 1.1*       Physical Exam: General: Pleasant, well developed ,male in no acute distress, scar on right chin from recent dermatologic procedure. Head: Normocephalic and atraumatic Eyes:  sclerae anicteric, conjunctiva pink  Ears: Normal auditory acuity Lungs: Clear throughout to auscultation Heart: Regular rate and rhythm Abdomen: Soft, non distended, non-tender. No masses, no hepatomegaly. Normal bowel sounds Musculoskeletal: Symmetrical with no gross deformities  Extremities: No edema  Neurological: Alert oriented x 4, grossly nonfocal Psychological:  Alert and cooperative. Normal mood and affect  Assessment and Recommendations: 80 year old male with an 8-10 day history of dysphagia to solids and liquids referred for evaluation. The patient has a long-standing history of reflux and has required dilations of esophageal strictures in the past. He will be scheduled for an EGD with possible dilation.The risks, benefits, and alternatives to endoscopy with possible biopsy and possible dilation were discussed with the patient and they consent to proceed. The procedure will be scheduled with  Dr. Fuller [name]. CBC and PT/INR will be obtained today to be sure the patient has no clotting issues in light of his history of cirrhosis.    Xyla Leisner, Vita Barley PA-C 09/21/2014,

## 2014-09-23 NOTE — Anesthesia Preprocedure Evaluation (Signed)
Anesthesia Evaluation  Patient identified by MRN, date of birth, ID band Patient awake    Reviewed: Allergy & Precautions, H&P , NPO status , Patient's Chart, lab work & pertinent test results  Airway Mallampati: II  TM Distance: >3 FB Neck ROM: full    Dental  (+) Edentulous Upper, Edentulous Lower, Dental Advisory Given   Pulmonary shortness of breath, with exertion, at rest and lying, asthma , COPD COPD inhaler, former smoker,  breath sounds clear to auscultation  Pulmonary exam normal       Cardiovascular hypertension, + CAD and +CHF + dysrhythmias Atrial Fibrillation + pacemaker Rhythm:regular Rate:Normal     Neuro/Psych Anxiety Depression Myasthenia gravis  Neuromuscular disease negative neurological ROS  negative psych ROS   GI/Hepatic negative GI ROS, hiatal hernia, GERD-  Medicated and Controlled,(+) Cirrhosis -      ,   Endo/Other  negative endocrine ROS  Renal/GU negative Renal ROS  negative genitourinary   Musculoskeletal   Abdominal   Peds  Hematology negative hematology ROS (+)   Anesthesia Other Findings   Reproductive/Obstetrics negative OB ROS                             Anesthesia Physical Anesthesia Plan  ASA: IV  Anesthesia Plan: MAC   Post-op Pain Management:    Induction:   Airway Management Planned:   Additional Equipment:   Intra-op Plan:   Post-operative Plan:   Informed Consent: I have reviewed the patients History and Physical, chart, labs and discussed the procedure including the risks, benefits and alternatives for the proposed anesthesia with the patient or authorized representative who has indicated his/her understanding and acceptance.   Dental Advisory Given  Plan Discussed with: CRNA and Surgeon  Anesthesia Plan Comments:         Anesthesia Quick Evaluation

## 2014-09-25 NOTE — Brief Op Note (Signed)
09/23/2014  10:15 AM  PATIENT:  [name] [name]  80 y.o. male  PRE-OPERATIVE DIAGNOSIS:  dysphagia  POST-OPERATIVE DIAGNOSIS: esophagitis, esophageal stricture  PROCEDURE:  Procedure(s): ESOPHAGOGASTRODUODENOSCOPY (EGD) (N/A) SAVORY DILATION (N/A)  WITH BIOPSIES  SURGEON:  Surgeon(s) and Role:   Ladene Artist, MD - Primary  PHYSICIAN ASSISTANT:   ANESTHESIA:   Fentanyl 75 mcg IV, Versed 8 mg IV, Cetacaine spray     DESCRIPTION OF PROCEDURE: After the risks benefits and alternatives of the procedure were thoroughly explained, informed consent was obtained. The endoscope endoscope was introduced through the mouth and advanced to the second portion of the duodenum , Without limitations. The instrument was slowly withdrawn as the mucosa was fully examined.  DUODENUM: Deformed bulb. The duodendal bulb and 2nd duodendum were otherwise normal. STOMACH: The stomach appeared normal. ESOPHAGUS: Esophagitis in mid esophagus, possible pill ulcer or infectious with stricturing with diamete about 11 mm. Multiple biopsies were obtained. A subtle EGJ stricture with diameter about 14 mm. A guidewire was placed and Savary dilators were passed with minimal resistance and minimal heme noted. 12.8 mm, 14 mm, 15 mm. Retroflexed views revealed a small hiatal hernia.The scope was then withdrawn from the patient and the procedure completed.  COMPLICATIONS: There were no immediate complications.  ENDOSCOPIC IMPRESSION: 1. Mid esophageal stricture and esophagitis; dilated and biopsied 2.   Subtle EGJ stricture; dilated 3. Small hiatal hernia 4.   Deformed duodenal bulb  RECOMMENDATIONS: 1. Continue PPI bid and Viscous lidocaine q4h prn 2. Avoid NSAID long term and hold KCl for 5 days and then resume taking with full 8 oz glass of water in fully upright position 3.  Await biopsies 4.  Standard post dilation instruction 5. Full liquid diet and advance to soft diet as tolerated 6.   Office appt with me or APP in 2-3 weeks

## 2014-09-26 ENCOUNTER — Encounter: Payer: Self-pay | Admitting: Gastroenterology

## 2014-09-27 ENCOUNTER — Encounter: Payer: Self-pay | Admitting: Gastroenterology

## 2014-09-27 ENCOUNTER — Encounter (HOSPITAL_COMMUNITY): Payer: Self-pay | Admitting: Gastroenterology

## 2014-10-03 ENCOUNTER — Encounter: Payer: Medicare Other | Admitting: *Deleted

## 2014-10-03 ENCOUNTER — Telehealth: Payer: Self-pay | Admitting: Cardiology

## 2014-10-03 NOTE — Telephone Encounter (Signed)
LMOVM reminding pt to send remote transmission.   

## 2014-10-05 ENCOUNTER — Ambulatory Visit (INDEPENDENT_AMBULATORY_CARE_PROVIDER_SITE_OTHER): Payer: Medicare Other | Admitting: *Deleted

## 2014-10-05 DIAGNOSIS — I482 Chronic atrial fibrillation, unspecified: Secondary | ICD-10-CM

## 2014-10-05 MED FILL — Propofol IV Emul 10 MG/ML: INTRAVENOUS | Qty: 60 | Status: AC

## 2014-10-05 NOTE — Progress Notes (Signed)
Remote pacemaker transmission.   

## 2014-10-06 LAB — MDC_IDC_ENUM_SESS_TYPE_REMOTE
Battery Remaining Longevity: 91 mo
Battery Voltage: 2.78 V
Brady Statistic RV Percent Paced: 100 %
Date Time Interrogation Session: 20151216210335
Lead Channel Impedance Value: 453 Ohm
Lead Channel Pacing Threshold Amplitude: 0.75 V
Lead Channel Setting Pacing Amplitude: 2.5 V
Lead Channel Setting Pacing Pulse Width: 0.4 ms
Lead Channel Setting Sensing Sensitivity: 2 mV
MDC IDC MSMT BATTERY IMPEDANCE: 486 Ohm
MDC IDC MSMT LEADCHNL RA IMPEDANCE VALUE: 67 Ohm
MDC IDC MSMT LEADCHNL RV PACING THRESHOLD PULSEWIDTH: 0.4 ms

## 2014-10-27 ENCOUNTER — Encounter: Payer: Self-pay | Admitting: Cardiology

## 2014-10-31 ENCOUNTER — Encounter: Payer: Self-pay | Admitting: Internal Medicine

## 2014-11-16 ENCOUNTER — Other Ambulatory Visit: Payer: Self-pay | Admitting: Gastroenterology

## 2014-11-25 ENCOUNTER — Encounter (HOSPITAL_COMMUNITY): Payer: Self-pay | Admitting: Family Medicine

## 2014-11-25 ENCOUNTER — Emergency Department (HOSPITAL_COMMUNITY): Payer: Medicare Other

## 2014-11-25 ENCOUNTER — Inpatient Hospital Stay (HOSPITAL_COMMUNITY)
Admission: EM | Admit: 2014-11-25 | Discharge: 2014-11-29 | DRG: 194 | Disposition: A | Discharge status: Home / Self Care | Payer: Medicare Other | Attending: Internal Medicine | Admitting: Internal Medicine

## 2014-11-25 DIAGNOSIS — Z87891 Personal history of nicotine dependence: Secondary | ICD-10-CM

## 2014-11-25 DIAGNOSIS — F419 Anxiety disorder, unspecified: Secondary | ICD-10-CM | POA: Diagnosis present

## 2014-11-25 DIAGNOSIS — Z96643 Presence of artificial hip joint, bilateral: Secondary | ICD-10-CM | POA: Diagnosis present

## 2014-11-25 DIAGNOSIS — Z7982 Long term (current) use of aspirin: Secondary | ICD-10-CM

## 2014-11-25 DIAGNOSIS — Z888 Allergy status to other drugs, medicaments and biological substances status: Secondary | ICD-10-CM | POA: Diagnosis not present

## 2014-11-25 DIAGNOSIS — Z85828 Personal history of other malignant neoplasm of skin: Secondary | ICD-10-CM | POA: Diagnosis not present

## 2014-11-25 DIAGNOSIS — E785 Hyperlipidemia, unspecified: Secondary | ICD-10-CM | POA: Diagnosis present

## 2014-11-25 DIAGNOSIS — J189 Pneumonia, unspecified organism: Principal | ICD-10-CM | POA: Diagnosis present

## 2014-11-25 DIAGNOSIS — W1830XA Fall on same level, unspecified, initial encounter: Secondary | ICD-10-CM | POA: Diagnosis not present

## 2014-11-25 DIAGNOSIS — J45909 Unspecified asthma, uncomplicated: Secondary | ICD-10-CM | POA: Diagnosis present

## 2014-11-25 DIAGNOSIS — N400 Enlarged prostate without lower urinary tract symptoms: Secondary | ICD-10-CM | POA: Diagnosis present

## 2014-11-25 DIAGNOSIS — J449 Chronic obstructive pulmonary disease, unspecified: Secondary | ICD-10-CM | POA: Diagnosis present

## 2014-11-25 DIAGNOSIS — I2510 Atherosclerotic heart disease of native coronary artery without angina pectoris: Secondary | ICD-10-CM | POA: Diagnosis present

## 2014-11-25 DIAGNOSIS — I5042 Chronic combined systolic (congestive) and diastolic (congestive) heart failure: Secondary | ICD-10-CM | POA: Diagnosis present

## 2014-11-25 DIAGNOSIS — R0602 Shortness of breath: Secondary | ICD-10-CM

## 2014-11-25 DIAGNOSIS — I5022 Chronic systolic (congestive) heart failure: Secondary | ICD-10-CM | POA: Diagnosis present

## 2014-11-25 DIAGNOSIS — K219 Gastro-esophageal reflux disease without esophagitis: Secondary | ICD-10-CM | POA: Diagnosis present

## 2014-11-25 DIAGNOSIS — Z96649 Presence of unspecified artificial hip joint: Secondary | ICD-10-CM | POA: Diagnosis present

## 2014-11-25 DIAGNOSIS — D649 Anemia, unspecified: Secondary | ICD-10-CM | POA: Diagnosis present

## 2014-11-25 DIAGNOSIS — I10 Essential (primary) hypertension: Secondary | ICD-10-CM | POA: Diagnosis present

## 2014-11-25 DIAGNOSIS — Z950 Presence of cardiac pacemaker: Secondary | ICD-10-CM | POA: Diagnosis not present

## 2014-11-25 DIAGNOSIS — K5730 Diverticulosis of large intestine without perforation or abscess without bleeding: Secondary | ICD-10-CM | POA: Diagnosis present

## 2014-11-25 DIAGNOSIS — Z79899 Other long term (current) drug therapy: Secondary | ICD-10-CM | POA: Diagnosis not present

## 2014-11-25 DIAGNOSIS — F329 Major depressive disorder, single episode, unspecified: Secondary | ICD-10-CM | POA: Diagnosis present

## 2014-11-25 DIAGNOSIS — I480 Paroxysmal atrial fibrillation: Secondary | ICD-10-CM | POA: Diagnosis present

## 2014-11-25 DIAGNOSIS — Y92238 Other place in hospital as the place of occurrence of the external cause: Secondary | ICD-10-CM

## 2014-11-25 DIAGNOSIS — F1010 Alcohol abuse, uncomplicated: Secondary | ICD-10-CM | POA: Diagnosis present

## 2014-11-25 DIAGNOSIS — Z886 Allergy status to analgesic agent status: Secondary | ICD-10-CM | POA: Diagnosis not present

## 2014-11-25 DIAGNOSIS — Z7951 Long term (current) use of inhaled steroids: Secondary | ICD-10-CM

## 2014-11-25 DIAGNOSIS — G7000 Myasthenia gravis without (acute) exacerbation: Secondary | ICD-10-CM | POA: Diagnosis present

## 2014-11-25 DIAGNOSIS — Z79891 Long term (current) use of opiate analgesic: Secondary | ICD-10-CM | POA: Diagnosis not present

## 2014-11-25 DIAGNOSIS — K7460 Unspecified cirrhosis of liver: Secondary | ICD-10-CM | POA: Diagnosis present

## 2014-11-25 DIAGNOSIS — F423 Hoarding disorder: Secondary | ICD-10-CM | POA: Diagnosis present

## 2014-11-25 DIAGNOSIS — Z23 Encounter for immunization: Secondary | ICD-10-CM | POA: Diagnosis not present

## 2014-11-25 DIAGNOSIS — I4891 Unspecified atrial fibrillation: Secondary | ICD-10-CM | POA: Diagnosis present

## 2014-11-25 DIAGNOSIS — Z9181 History of falling: Secondary | ICD-10-CM | POA: Diagnosis not present

## 2014-11-25 DIAGNOSIS — D6489 Other specified anemias: Secondary | ICD-10-CM | POA: Diagnosis present

## 2014-11-25 HISTORY — DX: Hoarding disorder: F42.3

## 2014-11-25 LAB — CBC
HCT: 31.2 % — ABNORMAL LOW (ref 39.0–52.0)
Hemoglobin: 10.5 g/dL — ABNORMAL LOW (ref 13.0–17.0)
MCH: 33.1 pg (ref 26.0–34.0)
MCHC: 33.7 g/dL (ref 30.0–36.0)
MCV: 98.4 fL (ref 78.0–100.0)
Platelets: 319 10*3/uL (ref 150–400)
RBC: 3.17 MIL/uL — ABNORMAL LOW (ref 4.22–5.81)
RDW: 17.6 % — ABNORMAL HIGH (ref 11.5–15.5)
WBC: 6.8 10*3/uL (ref 4.0–10.5)

## 2014-11-25 LAB — BASIC METABOLIC PANEL
ANION GAP: 7 (ref 5–15)
BUN: 13 mg/dL (ref 6–23)
CALCIUM: 8.8 mg/dL (ref 8.4–10.5)
CO2: 26 mmol/L (ref 19–32)
CREATININE: 0.61 mg/dL (ref 0.50–1.35)
Chloride: 103 mmol/L (ref 96–112)
GFR calc Af Amer: >90 mL/min (ref >90)
GFR, EST NON AFRICAN AMERICAN: 90 mL/min — AB (ref >90)
GLUCOSE: 116 mg/dL — AB (ref 70–99)
POTASSIUM: 4.6 mmol/L (ref 3.5–5.1)
SODIUM: 136 mmol/L (ref 135–145)

## 2014-11-25 LAB — I-STAT TROPONIN, ED: TROPONIN I, POC: 0 ng/mL (ref 0.00–0.08)

## 2014-11-25 LAB — BRAIN NATRIURETIC PEPTIDE: B Natriuretic Peptide: 660 pg/mL — ABNORMAL HIGH (ref 0.0–100.0)

## 2014-11-25 LAB — PROTIME-INR
INR: 1.18 (ref 0.00–1.49)
Prothrombin Time: 15.1 seconds (ref 11.6–15.2)

## 2014-11-25 MED ORDER — ESCITALOPRAM OXALATE 20 MG PO TABS
20.0000 mg | ORAL_TABLET | Freq: Every day | ORAL | Status: DC
  Administered 2014-11-26 – 2014-11-29 (×4): 20 mg via ORAL
  Filled 2014-11-25 (×4): qty 1

## 2014-11-25 MED ORDER — SODIUM CHLORIDE 0.9 % IJ SOLN: 3.0000 mL | INTRAMUSCULAR | Status: DC | PRN

## 2014-11-25 MED ORDER — ACETAMINOPHEN 325 MG PO TABS
650.0000 mg | ORAL_TABLET | Freq: Four times a day (QID) | ORAL | Status: DC | PRN
  Administered 2014-11-27: 650 mg via ORAL
  Filled 2014-11-25: qty 2

## 2014-11-25 MED ORDER — SODIUM CHLORIDE 0.9 % IJ SOLN
3.0000 mL | Freq: Two times a day (BID) | INTRAMUSCULAR | Status: DC
  Administered 2014-11-26 – 2014-11-29 (×5): 3 mL via INTRAVENOUS

## 2014-11-25 MED ORDER — VITAMIN B-1 100 MG PO TABS
100.0000 mg | ORAL_TABLET | Freq: Every day | ORAL | Status: DC
  Administered 2014-11-25 – 2014-11-29 (×5): 100 mg via ORAL
  Filled 2014-11-25 (×5): qty 1

## 2014-11-25 MED ORDER — HYDROMORPHONE HCL 1 MG/ML IJ SOLN
0.5000 mg | INTRAMUSCULAR | Status: DC | PRN
  Filled 2014-11-25 (×3): qty 1

## 2014-11-25 MED ORDER — ACETAMINOPHEN 650 MG RE SUPP: 650.0000 mg | Freq: Four times a day (QID) | RECTAL | Status: DC | PRN

## 2014-11-25 MED ORDER — AZATHIOPRINE 50 MG PO TABS
50.0000 mg | ORAL_TABLET | Freq: Three times a day (TID) | ORAL | Status: DC
  Administered 2014-11-25 – 2014-11-29 (×12): 50 mg via ORAL
  Filled 2014-11-25 (×15): qty 1

## 2014-11-25 MED ORDER — FINASTERIDE 5 MG PO TABS
5.0000 mg | ORAL_TABLET | Freq: Every day | ORAL | Status: DC
  Administered 2014-11-26 – 2014-11-29 (×4): 5 mg via ORAL
  Filled 2014-11-25 (×4): qty 1

## 2014-11-25 MED ORDER — DOCUSATE SODIUM 100 MG PO CAPS
100.0000 mg | ORAL_CAPSULE | Freq: Two times a day (BID) | ORAL | Status: DC
  Administered 2014-11-25 – 2014-11-29 (×7): 100 mg via ORAL
  Filled 2014-11-25 (×10): qty 1

## 2014-11-25 MED ORDER — OXYCODONE HCL 5 MG PO TABS
5.0000 mg | ORAL_TABLET | ORAL | Status: DC | PRN
  Administered 2014-11-26 – 2014-11-29 (×13): 5 mg via ORAL
  Filled 2014-11-25 (×14): qty 1

## 2014-11-25 MED ORDER — ONDANSETRON HCL 4 MG PO TABS: 4.0000 mg | ORAL_TABLET | Freq: Four times a day (QID) | ORAL | Status: DC | PRN

## 2014-11-25 MED ORDER — LORAZEPAM 2 MG/ML IJ SOLN: 0.0000 mg | Freq: Four times a day (QID) | INTRAMUSCULAR | Status: AC

## 2014-11-25 MED ORDER — METHYLPREDNISOLONE SODIUM SUCC 125 MG IJ SOLR
125.0000 mg | Freq: Once | INTRAMUSCULAR | Status: AC
  Administered 2014-11-25: 125 mg via INTRAVENOUS
  Filled 2014-11-25: qty 2

## 2014-11-25 MED ORDER — TEMAZEPAM 15 MG PO CAPS: 15.0000 mg | ORAL_CAPSULE | Freq: Every day | ORAL | Status: DC

## 2014-11-25 MED ORDER — DEXTROSE 5 % IV SOLN
500.0000 mg | Freq: Once | INTRAVENOUS | Status: AC
  Administered 2014-11-25: 500 mg via INTRAVENOUS
  Filled 2014-11-25: qty 500

## 2014-11-25 MED ORDER — TEMAZEPAM 15 MG PO CAPS
15.0000 mg | ORAL_CAPSULE | Freq: Once | ORAL | Status: AC
  Administered 2014-11-26: 15 mg via ORAL
  Filled 2014-11-25: qty 1

## 2014-11-25 MED ORDER — LORAZEPAM 1 MG PO TABS: 1.0000 mg | ORAL_TABLET | Freq: Four times a day (QID) | ORAL | Status: AC | PRN

## 2014-11-25 MED ORDER — SPIRONOLACTONE 25 MG PO TABS
25.0000 mg | ORAL_TABLET | Freq: Every day | ORAL | Status: DC
  Administered 2014-11-26 – 2014-11-29 (×4): 25 mg via ORAL
  Filled 2014-11-25 (×4): qty 1

## 2014-11-25 MED ORDER — LORAZEPAM 2 MG/ML IJ SOLN: 1.0000 mg | Freq: Four times a day (QID) | INTRAMUSCULAR | Status: AC | PRN

## 2014-11-25 MED ORDER — ENOXAPARIN SODIUM 30 MG/0.3ML ~~LOC~~ SOLN: 30.0000 mg | SUBCUTANEOUS | Status: DC

## 2014-11-25 MED ORDER — THIAMINE HCL 100 MG/ML IJ SOLN
100.0000 mg | Freq: Every day | INTRAMUSCULAR | Status: DC
  Filled 2014-11-25 (×5): qty 1

## 2014-11-25 MED ORDER — ASPIRIN EC 81 MG PO TBEC
81.0000 mg | DELAYED_RELEASE_TABLET | Freq: Every day | ORAL | Status: DC
  Administered 2014-11-26 – 2014-11-29 (×4): 81 mg via ORAL
  Filled 2014-11-25 (×4): qty 1

## 2014-11-25 MED ORDER — FOLIC ACID 1 MG PO TABS
1.0000 mg | ORAL_TABLET | Freq: Every day | ORAL | Status: DC
  Administered 2014-11-25 – 2014-11-29 (×5): 1 mg via ORAL
  Filled 2014-11-25 (×5): qty 1

## 2014-11-25 MED ORDER — ACETAMINOPHEN 500 MG PO TABS
1000.0000 mg | ORAL_TABLET | Freq: Once | ORAL | Status: AC
  Administered 2014-11-25: 1000 mg via ORAL
  Filled 2014-11-25: qty 2

## 2014-11-25 MED ORDER — IPRATROPIUM-ALBUTEROL 0.5-2.5 (3) MG/3ML IN SOLN
3.0000 mL | RESPIRATORY_TRACT | Status: DC | PRN
  Administered 2014-11-25: 3 mL via RESPIRATORY_TRACT
  Filled 2014-11-25: qty 3

## 2014-11-25 MED ORDER — ALUM & MAG HYDROXIDE-SIMETH 200-200-20 MG/5ML PO SUSP: 30.0000 mL | Freq: Four times a day (QID) | ORAL | Status: DC | PRN

## 2014-11-25 MED ORDER — DEXTROSE 5 % IV SOLN
1.0000 g | Freq: Once | INTRAVENOUS | Status: AC
  Administered 2014-11-25: 1 g via INTRAVENOUS
  Filled 2014-11-25: qty 10

## 2014-11-25 MED ORDER — SODIUM CHLORIDE 0.9 % IV SOLN: 250.0000 mL | INTRAVENOUS | Status: DC | PRN

## 2014-11-25 MED ORDER — ENOXAPARIN SODIUM 40 MG/0.4ML ~~LOC~~ SOLN
40.0000 mg | Freq: Every day | SUBCUTANEOUS | Status: DC
  Administered 2014-11-25 – 2014-11-28 (×4): 40 mg via SUBCUTANEOUS
  Filled 2014-11-25 (×5): qty 0.4

## 2014-11-25 MED ORDER — ONDANSETRON HCL 4 MG/2ML IJ SOLN: 4.0000 mg | Freq: Four times a day (QID) | INTRAMUSCULAR | Status: DC | PRN

## 2014-11-25 MED ORDER — LORAZEPAM 2 MG/ML IJ SOLN: 0.0000 mg | Freq: Two times a day (BID) | INTRAMUSCULAR | Status: DC

## 2014-11-25 MED ORDER — FUROSEMIDE 20 MG PO TABS
60.0000 mg | ORAL_TABLET | Freq: Every day | ORAL | Status: DC
  Administered 2014-11-26 – 2014-11-29 (×4): 60 mg via ORAL
  Filled 2014-11-25 (×4): qty 1

## 2014-11-25 MED ORDER — PANTOPRAZOLE SODIUM 40 MG PO TBEC
40.0000 mg | DELAYED_RELEASE_TABLET | Freq: Every day | ORAL | Status: DC
  Administered 2014-11-26 – 2014-11-29 (×4): 40 mg via ORAL
  Filled 2014-11-25 (×4): qty 1

## 2014-11-25 MED ORDER — ADULT MULTIVITAMIN W/MINERALS CH
1.0000 | ORAL_TABLET | Freq: Every day | ORAL | Status: DC
  Administered 2014-11-25 – 2014-11-29 (×5): 1 via ORAL
  Filled 2014-11-25 (×5): qty 1

## 2014-11-25 NOTE — ED Provider Notes (Signed)
CSN: [id number]     Arrival date & time 11/25/14  1643 History   First MD Initiated Contact with Patient 11/25/14 1819     Chief Complaint  Patient presents with  . Shortness of Breath     (Consider location/radiation/quality/duration/timing/severity/associated sxs/prior Treatment) HPI 81 year old male past history as below notable forcomplete heart block status post pacemaker, paroxysmal A. Fib, hypertension, CAD, COPD, myasthenia gravis, COPD, asthma, CHF, rheumatoid arthritis on chronic narcotics, EtOH abuse who presents to ED for 2 days of shortness of breath which has been progressively worsening over this time. Patient has also had cough. No reported fever. Denies chest pain. Family is present and report they're concerned about his safety at home. 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 States all he does is sit home and drink alcohol and take his narcotic pain meds. They have attempted to get patient into ALF and he refuses.  Lawyers have deemed him competent. Patient denies any leg swelling, increased weight gain, nausea, vomiting, other symptoms. No other complaints at this time.   Past Medical History  Diagnosis Date  . Complete heart block     s/p PPM  . PAF (paroxysmal atrial fibrillation)   . HTN (hypertension)   . HLD (hyperlipidemia)   . CAD (coronary artery disease)   . Depression   . Anxiety   . Hemorrhoids, external   . GERD (gastroesophageal reflux disease)   . Hyperplastic colonic polyp   . Diverticulosis of colon   . COPD (chronic obstructive pulmonary disease)   . Myasthenia gravis   . Arthritis, rheumatoid   . BPH (benign prostatic hypertrophy)   . Cirrhosis of liver   . Asthma   . Alcohol abuse   . Hiatal hernia   . Skin cancer of forehead     and left ear  . Lung nodule   . Nephrolithiasis   . CHF (congestive heart failure)   . Pacemaker   . Herpes zoster    Past Surgical History  Procedure Laterality Date  .  Total knee arthroplasty      bilateral  . Total hip arthroplasty      x3  . Splenectomy    . Appendectomy    . Pacemaker insertion  2009    medtronic  . Cardiac catheterization  10/12/2007    EF 60%  . Inguinal hernia repair    . Esophagogastroduodenoscopy N/A 09/23/2014    Procedure: ESOPHAGOGASTRODUODENOSCOPY (EGD);  Surgeon: Ladene Artist, MD;  Location: [facility];  Service: Endoscopy;  Laterality: N/A;  . Savory dilation N/A 09/23/2014    Procedure: SAVORY DILATION;  Surgeon: Ladene Artist, MD;  Location: [facility];  Service: Endoscopy;  Laterality: N/A;   Family History  Problem Relation Age of Onset  . Myasthenia gravis Mother   . Esophageal cancer Father   . HIV Son     Aids  . Diabetes Son   . Heart disease Maternal Uncle   . Colon cancer Other     Cousin on Mother's side  . Colon polyps Neg Hx    History  Substance Use Topics  . Smoking status: Former Smoker -- 40 years    Types: Cigars    Quit date: 10/22/1987  . Smokeless tobacco: Never Used  . Alcohol Use: 8.4 oz/week    14 Cans of beer per week    Review of Systems  Constitutional: Negative for fever, activity change and appetite change.  HENT: Negative  for congestion, rhinorrhea and sore throat.   Eyes: Negative for visual disturbance.  Respiratory: Positive for cough and shortness of breath.   Cardiovascular: Positive for leg swelling. Negative for chest pain and palpitations.  Gastrointestinal: Negative for nausea, vomiting, abdominal pain and diarrhea.  Genitourinary: Negative for dysuria, flank pain, decreased urine volume and difficulty urinating.  Musculoskeletal: Negative for back pain and neck pain.  Skin: Negative for rash.  Neurological: Negative for dizziness, syncope, speech difficulty, weakness, light-headedness, numbness and headaches.  Psychiatric/Behavioral: Negative for confusion.      Allergies  Remicade; Alendronate sodium; Benazepril; and Celebrex  Home Medications    Prior to Admission medications   Medication Sig Start Date End Date Taking? Authorizing Provider  aspirin EC 81 MG tablet Take 81 mg by mouth daily.    Historical Provider, MD  augmented betamethasone dipropionate (DIPROLENE-AF) 0.05 % ointment Apply topically as directed.  01/24/14   Historical Provider, MD  azaTHIOprine (IMURAN) 50 MG tablet Take 75 mg by mouth 3 (three) times daily. 11/07/13   Barton Dubois, MD  budesonide-formoterol [facility]) 160-4.5 MCG/ACT inhaler Inhale 2 puffs into the lungs 2 (two) times daily.    Historical Provider, MD  docusate sodium (COLACE) 100 MG capsule Take 100 mg by mouth 2 (two) times daily as needed for constipation.    Historical Provider, MD  escitalopram (LEXAPRO) 20 MG tablet Take 20 mg by mouth daily.  09/16/14   Historical Provider, MD  esomeprazole (NEXIUM) 40 MG capsule Take 40 mg by mouth 2 (two) times daily before a meal.     Historical Provider, MD  FIBER PO Take 1 tablet by mouth daily.    Historical Provider, MD  finasteride (PROSCAR) 5 MG tablet Take 5 mg by mouth daily.  11/17/13   Historical Provider, MD  furosemide (LASIX) 40 MG tablet Take 1.5 tablets (60 mg total) by mouth daily. 11/07/13   Barton Dubois, MD  Ipratropium-Albuterol (COMBIVENT) 20-100 MCG/ACT AERS respimat Inhale 1 puff into the lungs every 6 (six) hours as needed for wheezing or shortness of breath. 11/07/13   Barton Dubois, MD  lidocaine (XYLOCAINE) 2 % solution Use as directed 20 mLs in the mouth or throat every 4 (four) hours as needed (swallowing difficulties for 5 days). 09/23/14   Ladene Artist, MD  morphine (MSIR) 15 MG tablet Take 45 mg by mouth as needed. Take as directed 12/08/13   Historical Provider, MD  Omega-3 Fatty Acids (FISH OIL) 1000 MG CAPS Take 1 capsule by mouth daily.    Historical Provider, MD  oxyCODONE-acetaminophen (PERCOCET/ROXICET) 5-325 MG per tablet Take 1-2 tablets by mouth every 6 (six) hours as needed for moderate pain.     Historical Provider,  MD  silver sulfADIAZINE (SILVADENE) 1 % cream Apply 1 application topically daily as needed. 12/03/13   Bronson Ing, DPM  spironolactone (ALDACTONE) 50 MG tablet Take 25 mg by mouth daily.  01/30/14   Historical Provider, MD  temazepam (RESTORIL) 15 MG capsule Take 15 mg by mouth daily. For sleep    Historical Provider, MD  triamcinolone cream (KENALOG) 0.1 % Apply 1 application topically 2 (two) times daily as needed (to affected area).  03/19/12   Historical Provider, MD  valACYclovir (VALTREX) 1000 MG tablet Take 1 tablet (1,000 mg total) by mouth 3 (three) times daily. 12/20/13   Peter M Martinique, MD   BP 161/72 mmHg  Pulse 65  Temp(Src) 98.6 F (37 C)  Resp 10  SpO2 99% Physical Exam  Constitutional: He is oriented to person, place, and time. He appears well-developed and well-nourished. No distress.  HENT:  Head: Normocephalic and atraumatic.  Nose: Nose normal.  Mouth/Throat: Oropharynx is clear and moist. No oropharyngeal exudate.  Eyes: Conjunctivae and EOM are normal.  Neck: Normal range of motion. Neck supple. No JVD present.  Cardiovascular: Normal rate, regular rhythm, normal heart sounds and intact distal pulses.   Pulmonary/Chest: Effort normal. No respiratory distress. He has rales (bases).  Abdominal: Soft. He exhibits no distension. There is no tenderness. There is no rebound and no guarding.  Musculoskeletal: Normal range of motion. He exhibits edema (2+ BL low extremities).  Neurological: He is alert and oriented to person, place, and time. No cranial nerve deficit.  Skin: Skin is warm and dry. No rash noted.  Psychiatric: He has a normal mood and affect.  Nursing note and vitals reviewed.   ED Course  Procedures (including critical care time) Labs Review Labs Reviewed  CBC - Abnormal; Notable for the following:    RBC 3.17 (*)    Hemoglobin 10.5 (*)    HCT 31.2 (*)    RDW 17.6 (*)    All other components within normal limits  BASIC METABOLIC PANEL -  Abnormal; Notable for the following:    Glucose, Bld 116 (*)    GFR calc non Af Amer 90 (*)    All other components within normal limits  BRAIN NATRIURETIC PEPTIDE - Abnormal; Notable for the following:    B Natriuretic Peptide 660.0 (*)    All other components within normal limits  CULTURE, BLOOD (ROUTINE X 2)  CULTURE, BLOOD (ROUTINE X 2)  PROTIME-INR  I-STAT TROPOININ, ED    Imaging Review Dg Chest 2 View  11/25/2014   CLINICAL DATA:  Shortness of breath, difficulty breathing  EXAM: CHEST  2 VIEW  COMPARISON:  11/03/2013  FINDINGS: Increased right lower lobe consolidation with moderate right pleural effusion noted. Lungs are hyperinflated suggesting emphysema. Linear left lower lung zone scarring is stable. Calcified right lower lobe granulomas are reidentified. Bones are subjectively osteopenic. No thoracic compression deformity is identified. Trace left pleural fluid is present. Left-sided dual lead pacer in place.  IMPRESSION: Increased right basilar consolidation and moderate right pleural effusion suspicious for pneumonia.   Electronically Signed   By: Conchita Paris M.D.   On: 11/25/2014 19:01     EKG Interpretation None      MDM   Final diagnoses:  SOB (shortness of breath)   [name] [name] is a 81 y.o. male with H&P as above. Presents with shortness of breath. Increased work of breathing on exam. VSS. Patient has crackles bilaterally, worse on the right. Pt also has 2+ pitting edema BL. Screening labs and chest x-ray.  CXR shows pneumonia. Patient treated for community acquired pneumonia with Rocephin and is azithro. Also BNP up. Could be concomitant CHF exac. BP stable. Patient will be admitted to hospitalist for further management. I have discussed patient with case management regarding patient's social issues and they're aware and involving Education officer, museum.  Clinical Impression: 1. SOB (shortness of breath)     Disposition: Discharge  Condition: Good  I have discussed  the results, Dx and Tx plan with the pt(& family if present). He/she/they expressed understanding and agree(s) with the plan. Discharge instructions discussed at great length. Strict return precautions discussed and pt &/or family have verbalized understanding of the instructions. No further questions at time of discharge.    Current Discharge Medication  List      Follow Up: No follow-up provider specified.  Pt seen in conjunction with Dr. Thresa Ross, [facility] Resident - PGY-2       Kirstie Peri, MD 11/26/14 0931  Mariea Clonts, MD 11/26/14 [phone]

## 2014-11-25 NOTE — H&P (Signed)
[facility] Admission History and Physical       [name] [name] BPZ:[id number] DOB: 05-09-1929 DOA: 11/25/2014  Referring physician: EDP PCP: [name], MD  Specialists:   Chief Complaint: SOB  HPI: [name] [name] is a 81 y.o. male with a history of CAD, PAF, HTN, Systolic CHF, Cirrhosis, ETOH Abuse, and RA who presents to the Ed with complaints of worsening SOB x 2 days. He reports nonproductive cough, and denies fever and chills. In the ED, a Chest X-ray revealed a Right basilar opacity and he was placed on antibiotic coverage for CAP and referred for admission.  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  She also reports that he is a very dramatic person and very manipulative and is very volatile.     A Case managament consultation has been ordered.     Of note: Patient suffered a fall while he was in X-Ray, and hit  The back of his head.  A CT scan was subsequently done and was negative for acute findings.    Review of Systems:  Constitutional: No Weight Loss, No Weight Gain, Night Sweats, Fevers, Chills, Dizziness, Fatigue, or Generalized Weakness HEENT: No Headaches, Difficulty Swallowing,Tooth/Dental Problems,Sore Throat,  No Sneezing, Rhinitis, Ear Ache, Nasal Congestion, or Post Nasal Drip,  Cardio-vascular:  No Chest pain, Orthopnea, PND, Edema in Lower Extremities, Anasarca, Dizziness, Palpitations  Resp: +Dyspnea, No DOE, No Productive Cough, No Non-Productive Cough, No Hemoptysis, No Wheezing.    GI: No Heartburn, Indigestion, Abdominal Pain, Nausea, Vomiting, Diarrhea, Hematemesis, Hematochezia, Melena, Change in Bowel Habits,  Loss of Appetite  GU: No Dysuria, Change in Color of Urine, No Urgency or Frequency, No Flank pain.  Musculoskeletal: No Joint Pain or Swelling, No Decreased Range of Motion, No Back Pain.  Neurologic: No  Syncope, No Seizures, Muscle Weakness, Paresthesia, Vision Disturbance or Loss, No Diplopia, No Vertigo, No Difficulty Walking,  Skin: No Rash or Lesions. Psych: No Change in Mood or Affect, No Depression or Anxiety, No Memory loss, No Confusion, or Hallucinations   Past Medical History  Diagnosis Date  . Complete heart block     s/p PPM  . PAF (paroxysmal atrial fibrillation)   . HTN (hypertension)   . HLD (hyperlipidemia)   . CAD (coronary artery disease)   . Depression   . Anxiety   . Hemorrhoids, external   . GERD (gastroesophageal reflux disease)   . Hyperplastic colonic polyp   . Diverticulosis of colon   . COPD (chronic obstructive pulmonary disease)   . Myasthenia gravis   . Arthritis, rheumatoid   . BPH (benign prostatic hypertrophy)   . Cirrhosis of liver   . Asthma   . Alcohol abuse   . Hiatal hernia   . Skin cancer of forehead     and left ear  . Lung nodule   . Nephrolithiasis   . CHF (congestive heart failure)   . Pacemaker   . Herpes zoster       Past Surgical History  Procedure Laterality Date  . Total knee arthroplasty      bilateral  . Total hip arthroplasty      x3  . Splenectomy    . Appendectomy    . Pacemaker insertion  2009    medtronic  . Cardiac catheterization  10/12/2007    EF 60%  .  Inguinal hernia repair    . Esophagogastroduodenoscopy N/A 09/23/2014    Procedure: ESOPHAGOGASTRODUODENOSCOPY (EGD);  Surgeon: Ladene Artist, MD;  Location: [facility];  Service: Endoscopy;  Laterality: N/A;  . Savory dilation N/A 09/23/2014    Procedure: SAVORY DILATION;  Surgeon: Ladene Artist, MD;  Location: [facility];  Service: Endoscopy;  Laterality: N/A;       Prior to Admission medications   Medication Sig Start Date End Date Taking? Authorizing Provider  aspirin EC 81 MG tablet Take 81 mg by mouth daily.   Yes Historical Provider, MD  azaTHIOprine (IMURAN) 50 MG tablet Take 50 mg by mouth 3 (three) times daily.  11/07/13  Yes Barton Dubois, MD  budesonide-formoterol [facility]) 160-4.5 MCG/ACT inhaler Inhale 2 puffs into the lungs 2 (two) times daily.   Yes Historical Provider, MD  docusate sodium (COLACE) 100 MG capsule Take 100 mg by mouth 2 (two) times daily as needed for constipation.   Yes Historical Provider, MD  escitalopram (LEXAPRO) 20 MG tablet Take 20 mg by mouth daily.  09/16/14  Yes Historical Provider, MD  esomeprazole (NEXIUM) 40 MG capsule Take 40 mg by mouth 2 (two) times daily before a meal.    Yes Historical Provider, MD  FIBER PO Take 1 tablet by mouth 2 (two) times daily with a meal.    Yes Historical Provider, MD  finasteride (PROSCAR) 5 MG tablet Take 5 mg by mouth daily.  11/17/13  Yes Historical Provider, MD  furosemide (LASIX) 40 MG tablet Take 1.5 tablets (60 mg total) by mouth daily. Patient taking differently: Take 40 mg by mouth daily.  11/07/13  Yes Barton Dubois, MD  lidocaine (XYLOCAINE) 2 % solution Use as directed 20 mLs in the mouth or throat every 4 (four) hours as needed (swallowing difficulties for 5 days). 09/23/14  Yes Ladene Artist, MD  Omega-3 Fatty Acids (FISH OIL) 1000 MG CAPS Take 1 capsule by mouth daily.   Yes Historical Provider, MD  oxyCODONE-acetaminophen (PERCOCET/ROXICET) 5-325 MG per tablet Take 1-2 tablets by mouth every 6 (six) hours as needed for moderate pain.    Yes Historical Provider, MD  silver sulfADIAZINE (SILVADENE) 1 % cream Apply 1 application topically daily.  12/03/13  Yes Bronson Ing, DPM  spironolactone (ALDACTONE) 50 MG tablet Take 25 mg by mouth daily.  01/30/14  Yes Historical Provider, MD  temazepam (RESTORIL) 15 MG capsule Take 15 mg by mouth at bedtime. For sleep   Yes Historical Provider, MD  triamcinolone cream (KENALOG) 0.1 % Apply 1 application topically daily.  03/19/12  Yes Historical Provider, MD      Allergies  Allergen Reactions  . Celebrex [Celecoxib] Nausea And Vomiting and Other (See Comments)    Stomach ulcers and bleeding  .  Alendronate Sodium Other (See Comments)    Unknown.  . Benazepril Other (See Comments)    Unknown.  . Remicade [Infliximab] Other (See Comments)     Social History:  reports that he quit smoking about 27 years ago. His smoking use included Cigars. He has never used smokeless tobacco. He reports that he drinks about 8.4 oz of alcohol per week. He reports that he does not use illicit drugs.     Family History  Problem Relation Age of Onset  . Myasthenia gravis Mother   . Esophageal cancer Father   . HIV Son     Aids  . Diabetes Son   . Heart disease Maternal Uncle   . Colon cancer  Other     Cousin on Mother's side  . Colon polyps Neg Hx        Physical Exam:  GEN:  Elderly Well Developed  81 y.o. Caucasian male examined and in no acute distress; cooperative with exam Filed Vitals:   11/25/14 2045 11/25/14 2100 11/25/14 2134 11/25/14 2140  BP: 103/66 156/61 159/76   Pulse: 59 59 67   Temp:   98.6 F (37 C)   TempSrc:   Oral   Resp: 17 13 18    Height:    5\' 10"  (1.778 m)  Weight:    73.755 kg (162 lb 9.6 oz)  SpO2: 95% 93% 99%    Blood pressure 159/76, pulse 67, temperature 98.6 F (37 C), temperature source Oral, resp. rate 18, height 5\' 10"  (1.778 m), weight 73.755 kg (162 lb 9.6 oz), SpO2 99 %. PSYCH: He is alert and oriented x4; does not appear anxious does not appear depressed; affect is normal HEENT: Normocephalic and Atraumatic, Mucous membranes pink; PERRLA; EOM intact; Fundi:  Benign;  No scleral icterus, Nares: Patent, Oropharynx: Clear, Fair Dentition,    Neck:  FROM, No Cervical Lymphadenopathy nor Thyromegaly or Carotid Bruit; No JVD; Breasts:: Not examined CHEST WALL: No tenderness CHEST: Normal respiration, clear to auscultation bilaterally HEART: Regular rate and rhythm; no murmurs rubs or gallops BACK: No kyphosis or scoliosis; No CVA tenderness ABDOMEN: Positive Bowel Sounds,  Soft Non-Tender; No Masses, No Organomegaly. Rectal Exam: Not  done EXTREMITIES: No  Cyanosis, Clubbing, or Edema; No Ulcerations. Genitalia: not examined PULSES: 2+ and symmetric SKIN: Normal hydration no rash or ulceration CNS:  Alert and Oriented without Focal Deficits Vascular: pulses palpable throughout    Labs on Admission:  Basic Metabolic Panel:  Recent Labs Lab 11/25/14 1732  NA 136  K 4.6  CL 103  CO2 26  GLUCOSE 116*  BUN 13  CREATININE 0.61  CALCIUM 8.8   Liver Function Tests: No results for input(s): AST, ALT, ALKPHOS, BILITOT, PROT, ALBUMIN in the last 168 hours. No results for input(s): LIPASE, AMYLASE in the last 168 hours. No results for input(s): AMMONIA in the last 168 hours. CBC:  Recent Labs Lab 11/25/14 1732  WBC 6.8  HGB 10.5*  HCT 31.2*  MCV 98.4  PLT 319   Cardiac Enzymes: No results for input(s): CKTOTAL, CKMB, CKMBINDEX, TROPONINI in the last 168 hours.  BNP (last 3 results)  Recent Labs  11/25/14 1732  BNP 660.0*    ProBNP (last 3 results) No results for input(s): PROBNP in the last 8760 hours.  CBG: No results for input(s): GLUCAP in the last 168 hours.  Radiological Exams on Admission: Dg Chest 2 View  11/25/2014   CLINICAL DATA:  Shortness of breath, difficulty breathing  EXAM: CHEST  2 VIEW  COMPARISON:  11/03/2013  FINDINGS: Increased right lower lobe consolidation with moderate right pleural effusion noted. Lungs are hyperinflated suggesting emphysema. Linear left lower lung zone scarring is stable. Calcified right lower lobe granulomas are reidentified. Bones are subjectively osteopenic. No thoracic compression deformity is identified. Trace left pleural fluid is present. Left-sided dual lead pacer in place.  IMPRESSION: Increased right basilar consolidation and moderate right pleural effusion suspicious for pneumonia.   Electronically Signed   By: Conchita Paris M.D.   On: 11/25/2014 19:01   Ct Head Wo Contrast  11/25/2014   CLINICAL DATA:  [place] [address].  Hit head.  EXAM: CT HEAD WITHOUT  CONTRAST  TECHNIQUE: Contiguous axial images were obtained from the  base of the skull through the vertex without intravenous contrast.  COMPARISON:  09/09/2011  FINDINGS: Stable age related cerebral atrophy, ventriculomegaly and periventricular white matter disease. No extra-axial fluid collections are identified. No CT findings for acute hemispheric infarction or intracranial hemorrhage. No mass lesions. The brainstem and cerebellum are normal.  No acute skull fracture. The paranasal sinuses and mastoid air cells are grossly clear. The globes are intact.  IMPRESSION: No acute intracranial findings or skull fracture.   Electronically Signed   By: Kalman Jewels M.D.   On: 11/25/2014 19:56     EKG: Independently reviewed. Paced rate at 67    Assessment/Plan:    81 y.o. male with   Principal Problem:   1.    CAP (community acquired pneumonia): Probable   IV Rocephin and Azithromycin   DuoNebs   O2    Monitor O2 Sats   Active Problems:   2.   COPD (chronic obstructive pulmonary disease)   DuoNebs   Monitor O2 sats     3.   Chronic systolic CHF (congestive heart failure)   Monitor for Decompensation   Continue lasix and Spironolactone Rx     4.   CAD (coronary artery disease)   Continue ASA     5.    Atrial fibrillation   continue ASA Rx    6.    Alcohol abuse   CIWA Protocol     7.    Essential hypertension, benign   Monitor BPs   Continue lasix and Spironolactone Rx     8.    Anemia due to other cause   Send Anemia Panel     9.    DVT Prophylaxis    Lovenox   10.   Other-   Case management consult for Living Situation, Consideration for placement options    Code Status:     FULL CODE  Family Communication:    Daughter (HCPOA) at Bedside Disposition Plan:     Inpatient Telemetry Unit  Time spent: [place] [facility] Pager [phone]   If [place] Please Contact the Day Rounding Team MD for [facility]  If 7PM-7AM,  Please Contact Night-Floor Coverage  [email or web address] Password [facility] 11/25/2014, 9:48 PM

## 2014-11-25 NOTE — ED Notes (Signed)
Phone call received from Xray stating that pt had fallen.  This RN and EDP went to assess pt.  Pt reporting head pain. No other complaints reported.  Pt CAO x 4 in Xray with staff.  Sts he may have possibly lost consciousness.

## 2014-11-25 NOTE — Progress Notes (Signed)
Received report from Janelle, RN

## 2014-11-25 NOTE — ED Notes (Signed)
Pt reporting headache upon return from Xray.  Verbal order given from Dr. Hanley Ben for 1gram of tylenol PO.

## 2014-11-25 NOTE — ED Notes (Signed)
Pt here for SOB that started yesterday. sts some chest pain. Denies cough, fever.

## 2014-11-26 DIAGNOSIS — I2510 Atherosclerotic heart disease of native coronary artery without angina pectoris: Secondary | ICD-10-CM

## 2014-11-26 DIAGNOSIS — J189 Pneumonia, unspecified organism: Principal | ICD-10-CM

## 2014-11-26 DIAGNOSIS — I4891 Unspecified atrial fibrillation: Secondary | ICD-10-CM

## 2014-11-26 DIAGNOSIS — I5022 Chronic systolic (congestive) heart failure: Secondary | ICD-10-CM

## 2014-11-26 DIAGNOSIS — J449 Chronic obstructive pulmonary disease, unspecified: Secondary | ICD-10-CM | POA: Diagnosis present

## 2014-11-26 DIAGNOSIS — D6489 Other specified anemias: Secondary | ICD-10-CM

## 2014-11-26 DIAGNOSIS — I10 Essential (primary) hypertension: Secondary | ICD-10-CM

## 2014-11-26 DIAGNOSIS — F1010 Alcohol abuse, uncomplicated: Secondary | ICD-10-CM

## 2014-11-26 LAB — CBC
HCT: 29.9 % — ABNORMAL LOW (ref 39.0–52.0)
HEMOGLOBIN: 10 g/dL — AB (ref 13.0–17.0)
MCH: 31.6 pg (ref 26.0–34.0)
MCHC: 33.4 g/dL (ref 30.0–36.0)
MCV: 94.6 fL (ref 78.0–100.0)
PLATELETS: 340 10*3/uL (ref 150–400)
RBC: 3.16 MIL/uL — AB (ref 4.22–5.81)
RDW: 17.8 % — AB (ref 11.5–15.5)
WBC: 4.7 10*3/uL (ref 4.0–10.5)

## 2014-11-26 LAB — BASIC METABOLIC PANEL
Anion gap: 8 (ref 5–15)
BUN: 13 mg/dL (ref 6–23)
CO2: 26 mmol/L (ref 19–32)
Calcium: 8.4 mg/dL (ref 8.4–10.5)
Chloride: 102 mmol/L (ref 96–112)
Creatinine, Ser: 0.61 mg/dL (ref 0.50–1.35)
GFR calc Af Amer: >90 mL/min (ref >90)
GFR calc non Af Amer: 90 mL/min — ABNORMAL LOW (ref >90)
Glucose, Bld: 218 mg/dL — ABNORMAL HIGH (ref 70–99)
POTASSIUM: 4.4 mmol/L (ref 3.5–5.1)
Sodium: 136 mmol/L (ref 135–145)

## 2014-11-26 MED ORDER — DEXTROSE 5 % IV SOLN
500.0000 mg | Freq: Once | INTRAVENOUS | Status: AC
  Administered 2014-11-26: 500 mg via INTRAVENOUS
  Filled 2014-11-26: qty 500

## 2014-11-26 MED ORDER — TEMAZEPAM 15 MG PO CAPS
15.0000 mg | ORAL_CAPSULE | Freq: Every evening | ORAL | Status: DC | PRN
  Administered 2014-11-27 – 2014-11-28 (×3): 15 mg via ORAL
  Filled 2014-11-26 (×3): qty 1

## 2014-11-26 MED ORDER — PNEUMOCOCCAL VAC POLYVALENT 25 MCG/0.5ML IJ INJ
0.5000 mL | INJECTION | INTRAMUSCULAR | Status: AC
  Administered 2014-11-27: 0.5 mL via INTRAMUSCULAR
  Filled 2014-11-26: qty 0.5

## 2014-11-26 MED ORDER — CEFTRIAXONE SODIUM IN DEXTROSE 20 MG/ML IV SOLN
1.0000 g | Freq: Once | INTRAVENOUS | Status: AC
  Administered 2014-11-26: 1 g via INTRAVENOUS
  Filled 2014-11-26: qty 50

## 2014-11-26 NOTE — Plan of Care (Signed)
Problem: Phase II Progression Outcomes Goal: Other Phase II Outcomes/Goals Living Better with Heart Failure packet given

## 2014-11-26 NOTE — Progress Notes (Signed)
NURSING PROGRESS NOTE  [name] [name] [id number] Admission Data: 11/26/2014 8:23 AM Attending Provider: Verlee Monte, MD THY:HOOILN,ZVJK A, MD Code Status: Full  [name] [name] is a 81 y.o. male patient admitted from ED:  -No acute distress noted.  -No complaints of shortness of breath.  -No complaints of chest pain.   Cardiac Monitoring: Box # 19 in place. Cardiac monitor yields: Paced rhythm .  Blood pressure 159/76, pulse 67, temperature 98.6 F, temperature source Oral, resp. rate 18, height 5\' 10"  (1.778 m), weight 73.755 kg (162 lb 9.6 oz), SpO2 99 %.  IV Fluids:  IV in place, occlusive dsg intact without redness, IV cath forearm left, condition patent and no redness normal saline @ KVO  Allergies:  Celebrex; Alendronate sodium; Benazepril; and Remicade  Past Medical History:   has a past medical history of Complete heart block; PAF (paroxysmal atrial fibrillation); HTN (hypertension); HLD (hyperlipidemia); CAD (coronary artery disease); Depression; Anxiety; Hemorrhoids, external; GERD (gastroesophageal reflux disease); Hyperplastic colonic polyp; Diverticulosis of colon; COPD (chronic obstructive pulmonary disease); Myasthenia gravis; Arthritis, rheumatoid; BPH (benign prostatic hypertrophy); Cirrhosis of liver; Asthma; Alcohol abuse; Hiatal hernia; Skin cancer of forehead; Lung nodule; Nephrolithiasis; CHF (congestive heart failure); Pacemaker; and Herpes zoster.  Past Surgical History:   has past surgical history that includes Total knee arthroplasty; Total hip arthroplasty; Splenectomy; Appendectomy; Pacemaker insertion (2009); Cardiac catheterization (10/12/2007); Inguinal hernia repair; Esophagogastroduodenoscopy (N/A, 09/23/2014); and Savory dilation (N/A, 09/23/2014).  Social History:   reports that he quit smoking about 27 years ago. His smoking use included Cigars. He has never used smokeless tobacco. He reports that he drinks about 8.4 oz of alcohol per week. He reports that he  does not use illicit drugs.  Skin: Stage 1 on sacrum  Patient/Family orientated to room. Information packet given to patient/family. Admission inpatient armband information verified with patient/family to include name and date of birth and placed on patient arm. Side rails up x 2, fall assessment and education completed with patient/family. Patient/family able to verbalize understanding of risk associated with falls and verbalized understanding to call for assistance before getting out of bed. Call light within reach. Patient/family able to voice and demonstrate understanding of unit orientation instructions.

## 2014-11-26 NOTE — Progress Notes (Signed)
[facility] PROGRESS NOTE   [name] [name] [id number] DOB: 01-Aug-1929 DOA: 11/25/2014 PCP: [name], MD  HPI/Subjective: Feels better than yesterday, still has some cough and sputum production. Not a lot of shortness of breath.  Assessment/Plan: Principal Problem:   CAP (community acquired pneumonia): Probable Active Problems:   Essential hypertension, benign   Atrial fibrillation   CAD (coronary artery disease)   Chronic systolic CHF (congestive heart failure)   Alcohol abuse   COPD (chronic obstructive pulmonary disease)   Anemia due to other cause    CAP (community acquired pneumonia) Presented with fever, cough, sputum production and shortness of breath. CXR showed right lower lobe infiltrates and small effusion consistent with pneumonia. Patient is started on azithromycin and Rocephin, continue current antibiotics. Continue supportive management with antitussives, bronchodilators, mucolytics and oxygen as needed.  COPD (chronic obstructive pulmonary disease) No wheezing, had 1 dose of steroids in the ED, did not continue steroids. Monitor closely, continue bronchodilators.  Chronic systolic CHF (congestive heart failure) Patient is on Lasix and spironolactone, continued. Currently no symptoms or signs of decompensation, follow closely.  CAD (coronary artery disease) Continue ASA, no acute issues.  Atrial fibrillation Rate is controlled, not on anticoagulation. Continue aspirin.  Alcohol abuse Reported by his daughter that he abuses alcohol. Started on CIWA protocol.  Essential hypertension, benign Continue home medications  Anemia due to other cause Anemia panel normal iron.   Code Status: Full Code Family Communication: Plan discussed with the patient. Disposition Plan: Remains inpatient Diet: Diet Heart  Consultants:  None  Procedures:  None  Antibiotics:  Rocephin and azithromycin   Objective: Filed Vitals:   11/26/14  1022  BP: 131/52  Pulse: 61  Temp: 98.9 F (37.2 C)  Resp: 16    Intake/Output Summary (Last 24 hours) at 11/26/14 1353 Last data filed at 11/26/14 1138  Gross per 24 hour  Intake      0 ml  Output    585 ml  Net   -585 ml   Filed Weights   11/25/14 2140  Weight: 73.755 kg (162 lb 9.6 oz)    Exam: General: Alert and awake, oriented x3, not in any acute distress. HEENT: anicteric sclera, pupils reactive to light and accommodation, EOMI CVS: S1-S2 clear, no murmur rubs or gallops Chest: clear to auscultation bilaterally, no wheezing, rales or rhonchi Abdomen: soft nontender, nondistended, normal bowel sounds, no organomegaly Extremities: no cyanosis, clubbing or edema noted bilaterally Neuro: Cranial nerves II-XII intact, no focal neurological deficits  Data Reviewed: Basic Metabolic Panel:  Recent Labs Lab 11/25/14 1732 11/26/14 0455  NA 136 136  K 4.6 4.4  CL 103 102  CO2 26 26  GLUCOSE 116* 218*  BUN 13 13  CREATININE 0.61 0.61  CALCIUM 8.8 8.4   Liver Function Tests: No results for input(s): AST, ALT, ALKPHOS, BILITOT, PROT, ALBUMIN in the last 168 hours. No results for input(s): LIPASE, AMYLASE in the last 168 hours. No results for input(s): AMMONIA in the last 168 hours. CBC:  Recent Labs Lab 11/25/14 1732 11/26/14 0455  WBC 6.8 4.7  HGB 10.5* 10.0*  HCT 31.2* 29.9*  MCV 98.4 94.6  PLT 319 340   Cardiac Enzymes: No results for input(s): CKTOTAL, CKMB, CKMBINDEX, TROPONINI in the last 168 hours. BNP (last 3 results)  Recent Labs  11/25/14 1732  BNP 660.0*    ProBNP (last 3 results) No results for input(s): PROBNP in the last 8760 hours.  CBG: No results for input(s): GLUCAP  in the last 168 hours.  Micro No results found for this or any previous visit (from the past 240 hour(s)).   Studies: Dg Chest 2 View  11/25/2014   CLINICAL DATA:  Shortness of breath, difficulty breathing  EXAM: CHEST  2 VIEW  COMPARISON:  11/03/2013  FINDINGS:  Increased right lower lobe consolidation with moderate right pleural effusion noted. Lungs are hyperinflated suggesting emphysema. Linear left lower lung zone scarring is stable. Calcified right lower lobe granulomas are reidentified. Bones are subjectively osteopenic. No thoracic compression deformity is identified. Trace left pleural fluid is present. Left-sided dual lead pacer in place.  IMPRESSION: Increased right basilar consolidation and moderate right pleural effusion suspicious for pneumonia.   Electronically Signed   By: Conchita Paris M.D.   On: 11/25/2014 19:01   Ct Head Wo Contrast  11/25/2014   CLINICAL DATA:  [address].  Hit head.  EXAM: CT HEAD WITHOUT CONTRAST  TECHNIQUE: Contiguous axial images were obtained from the base of the skull through the vertex without intravenous contrast.  COMPARISON:  09/09/2011  FINDINGS: Stable age related cerebral atrophy, ventriculomegaly and periventricular white matter disease. No extra-axial fluid collections are identified. No CT findings for acute hemispheric infarction or intracranial hemorrhage. No mass lesions. The brainstem and cerebellum are normal.  No acute skull fracture. The paranasal sinuses and mastoid air cells are grossly clear. The globes are intact.  IMPRESSION: No acute intracranial findings or skull fracture.   Electronically Signed   By: Kalman Jewels M.D.   On: 11/25/2014 19:56    Scheduled Meds: . aspirin EC  81 mg Oral Daily  . azaTHIOprine  50 mg Oral TID  . docusate sodium  100 mg Oral BID  . enoxaparin (LOVENOX) injection  40 mg Subcutaneous QHS  . escitalopram  20 mg Oral Daily  . finasteride  5 mg Oral Daily  . folic acid  1 mg Oral Daily  . furosemide  60 mg Oral Daily  . LORazepam  0-4 mg Intravenous Q6H   Followed by  . [START ON 11/27/2014] LORazepam  0-4 mg Intravenous Q12H  . multivitamin with minerals  1 tablet Oral Daily  . pantoprazole  40 mg Oral Daily  . sodium chloride  3 mL Intravenous Q12H  . spironolactone   25 mg Oral Daily  . thiamine  100 mg Oral Daily   Or  . thiamine  100 mg Intravenous Daily   Continuous Infusions:      Time spent: 35 minutes    [facility] A  [facility] Pager [phone] If 7PM-7AM, please contact night-coverage at [email or web address], password [facility] 11/26/2014, 1:53 PM  LOS: 1 day

## 2014-11-26 NOTE — Progress Notes (Signed)
UR completed 

## 2014-11-27 DIAGNOSIS — J189 Pneumonia, unspecified organism: Secondary | ICD-10-CM | POA: Diagnosis not present

## 2014-11-27 NOTE — Evaluation (Signed)
Physical Therapy Evaluation Patient Details Name: [name] [name] MRN: [id number] DOB: 06-10-1929 Today's Date: 11/27/2014   History of Present Illness  [name] [name] is a 81 y.o. male with a history of CAD, PAF, HTN, Systolic CHF, Cirrhosis, ETOH Abuse, and RA who presents to the Ed with complaints of worsening SOB x 2 days. He reports nonproductive cough, and denies fever and chills. In the ED, a Chest X-ray revealed a Right basilar opacity and he was placed on antibiotic coverage for CAP and referred for admission.  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  She also reports that he is a very dramatic person and very manipulative and is very volatile.     A Case managament consultation has been ordered.   Clinical Impression   Pt admitted with above diagnosis. Pt currently with functional limitations due to the deficits listed below (see PT Problem List).  Pt will benefit from skilled PT to increase their independence and safety with mobility to allow discharge to the venue listed below.       Follow Up Recommendations Home health PT;Other (comment) [facility] for chronic disease management; [place] home safety)    Equipment Recommendations  Rolling walker with 5" wheels;3in1 (PT) (likely already has)    Recommendations for Other Services OT consult;Other (comment) (SW)     Precautions / Restrictions Precautions Precautions: Fall      Mobility  Bed Mobility Overal bed mobility: Modified Independent             General bed mobility comments: used rails  Transfers Overall transfer level: Needs assistance Equipment used: Rolling walker (2 wheeled) Transfers: Sit to/from Stand Sit to Stand: Supervision         General transfer comment: Supervision for safety given recent fall in Radiology  Ambulation/Gait Ambulation/Gait assistance: Min  guard;Supervision Ambulation Distance (Feet): 250 Feet Assistive device: Rolling walker (2 wheeled) Gait Pattern/deviations: Step-through pattern     General Gait Details: Minguard assist progressing to supervision with bil UE support on RW; cues to self-monitor for activity tolerance; had to stop and perform pursed lip breathing 3 times to keep O2 sats at acceptable levels  Stairs            Wheelchair Mobility    Modified Rankin (Stroke Patients Only)       Balance Overall balance assessment: Needs assistance             Standing balance comment: Definitely benefitted from Bil UE support for balance during walk                             Pertinent Vitals/Pain Pain Assessment: Faces Faces Pain Scale: Hurts little more Pain Location: L hip, knees, reports is always in pain Pain Descriptors / Indicators: Constant Pain Intervention(s): Monitored during session    Home Living Family/patient expects to be discharged to:: Private residence Living Arrangements: Alone Available Help at Discharge: Family Type of Home: House Home Access: Stairs to enter   Technical brewer of Steps: 3 Home Layout: One level Home Equipment: Environmental consultant - 2 wheels;Bedside commode Additional Comments: Noted pt's daughter's concern about pt fitting RW into house due to clutter    Prior Function Level of Independence: Independent         Comments: reports uses RW  prn     Hand Dominance        Extremity/Trunk Assessment   Upper Extremity Assessment: Overall WFL for tasks assessed           Lower Extremity Assessment: Generalized weakness         Communication   Communication: No difficulties  Cognition Arousal/Alertness: Awake/alert Behavior During Therapy: WFL for tasks assessed/performed Overall Cognitive Status: Within Functional Limits for tasks assessed                      General Comments General comments (skin integrity, edema,  etc.): See other PT note of this date for oxygen qualifying amb    Exercises        Assessment/Plan    PT Assessment Patient needs continued PT services  PT Diagnosis Difficulty walking;Generalized weakness   PT Problem List Decreased strength;Decreased activity tolerance;Decreased balance;Decreased mobility;Decreased knowledge of use of DME;Decreased safety awareness;Cardiopulmonary status limiting activity  PT Treatment Interventions DME instruction;Gait training;Stair training;Functional mobility training;Therapeutic activities;Therapeutic exercise;Balance training;Patient/family education   PT Goals (Current goals can be found in the Care Plan section) Acute Rehab PT Goals Patient Stated Goal: to get better PT Goal Formulation: With patient Time For Goal Achievement: 12/11/14 Potential to Achieve Goals: Good    Frequency Min 3X/week   Barriers to discharge Decreased caregiver support;Inaccessible home environment Daughtter has concerns re: the safety of his home    Co-evaluation               End of Session Equipment Utilized During Treatment: Oxygen Activity Tolerance: Patient tolerated treatment well Patient left: in bed;with call bell/phone within reach;with bed alarm set Nurse Communication: Mobility status         Time: 7494-4967 PT Time Calculation (min) (ACUTE ONLY): 29 min   Charges:   PT Evaluation $Initial PT Evaluation Tier I: 1 Procedure PT Treatments $Gait Training: 8-22 mins   PT G CodesQuin Hoop 11/27/2014, 5:12 PM  [name], [place] Pager [phone] Office [phone]

## 2014-11-27 NOTE — Progress Notes (Signed)
Physical Therapy  Note  See also PT evaluation note to follow for other details;   SATURATION QUALIFICATIONS: (This note is used to comply with regulatory documentation for home oxygen)  Patient Saturations on Room Air at Rest = 90%  Patient Saturations on Room Air while Ambulating = 84%  Patient Saturations on 3-4 Liters of oxygen while Ambulating = 89-96%  Please briefly explain why patient needs home oxygen: Patient requires supplemental oxygen to maintain oxygen saturations at acceptable, safe levels with physical activity.   [name], [place]  Acute Rehabilitation Services Pager [phone] Office [phone]

## 2014-11-27 NOTE — Progress Notes (Signed)
[facility] PROGRESS NOTE   [name] [name] [id number] DOB: Sep 02, 1929 DOA: 11/25/2014 PCP: [name], MD  HPI/Subjective: Better, still complains about some cough and minimal sputum  Assessment/Plan: Principal Problem:   CAP (community acquired pneumonia): Probable Active Problems:   Essential hypertension, benign   Atrial fibrillation   CAD (coronary artery disease)   Chronic systolic CHF (congestive heart failure)   Alcohol abuse   COPD (chronic obstructive pulmonary disease)   Anemia due to other cause    CAP (community acquired pneumonia) Presented with fever, cough, sputum production and shortness of breath. CXR showed right lower lobe infiltrates and small effusion consistent with pneumonia. Patient is started on azithromycin and Rocephin, continue current antibiotics. Continue supportive management with antitussives, bronchodilators, mucolytics and oxygen as needed. Continue current regimen, try to wean oxygen, PT/OT to evaluate.  COPD (chronic obstructive pulmonary disease) No wheezing, had 1 dose of steroids in the ED, did not continue steroids. Monitor closely, continue bronchodilators.  Chronic systolic CHF (congestive heart failure) Patient is on Lasix and spironolactone, continued. Currently no symptoms or signs of decompensation, follow closely. No changes.  CAD (coronary artery disease) Continue ASA, no acute issues.  Atrial fibrillation Rate is controlled, not on anticoagulation. Continue aspirin.  Alcohol abuse Reported by his daughter that he abuses alcohol. Started on CIWA protocol.  Essential hypertension, benign Continue home medications  Anemia due to other cause Anemia panel normal iron.   Code Status: Full Code Family Communication: Plan discussed with the patient. Disposition Plan: Remains inpatient Diet: Diet Heart  Consultants:  None  Procedures:  None  Antibiotics:  Rocephin and  azithromycin   Objective: Filed Vitals:   11/27/14 1426  BP: 133/55  Pulse: 61  Temp: 98.8 F (37.1 C)  Resp: 16    Intake/Output Summary (Last 24 hours) at 11/27/14 1605 Last data filed at 11/27/14 1428  Gross per 24 hour  Intake    822 ml  Output   4450 ml  Net  -3628 ml   Filed Weights   11/25/14 2140 11/27/14 0613  Weight: 73.755 kg (162 lb 9.6 oz) 70.761 kg (156 lb)    Exam: General: Alert and awake, oriented x3, not in any acute distress. HEENT: anicteric sclera, pupils reactive to light and accommodation, EOMI CVS: S1-S2 clear, no murmur rubs or gallops Chest: clear to auscultation bilaterally, no wheezing, rales or rhonchi Abdomen: soft nontender, nondistended, normal bowel sounds, no organomegaly Extremities: no cyanosis, clubbing or edema noted bilaterally Neuro: Cranial nerves II-XII intact, no focal neurological deficits  Data Reviewed: Basic Metabolic Panel:  Recent Labs Lab 11/25/14 1732 11/26/14 0455  NA 136 136  K 4.6 4.4  CL 103 102  CO2 26 26  GLUCOSE 116* 218*  BUN 13 13  CREATININE 0.61 0.61  CALCIUM 8.8 8.4   Liver Function Tests: No results for input(s): AST, ALT, ALKPHOS, BILITOT, PROT, ALBUMIN in the last 168 hours. No results for input(s): LIPASE, AMYLASE in the last 168 hours. No results for input(s): AMMONIA in the last 168 hours. CBC:  Recent Labs Lab 11/25/14 1732 11/26/14 0455  WBC 6.8 4.7  HGB 10.5* 10.0*  HCT 31.2* 29.9*  MCV 98.4 94.6  PLT 319 340   Cardiac Enzymes: No results for input(s): CKTOTAL, CKMB, CKMBINDEX, TROPONINI in the last 168 hours. BNP (last 3 results)  Recent Labs  11/25/14 1732  BNP 660.0*    ProBNP (last 3 results) No results for input(s): PROBNP in the last 8760 hours.  CBG:  No results for input(s): GLUCAP in the last 168 hours.  Micro Recent Results (from the past 240 hour(s))  Blood culture (routine x 2)     Status: None (Preliminary result)   Collection Time: 11/25/14  7:55 PM   Result Value Ref Range Status   Specimen Description BLOOD FOREARM LEFT  Final   Special Requests BOTTLES DRAWN AEROBIC AND ANAEROBIC 5CC  Final   Culture   Final           BLOOD CULTURE RECEIVED NO GROWTH TO DATE CULTURE WILL BE HELD FOR 5 DAYS BEFORE ISSUING A FINAL NEGATIVE REPORT Performed at [facility]    Report Status PENDING  Incomplete  Blood culture (routine x 2)     Status: None (Preliminary result)   Collection Time: 11/25/14  8:00 PM  Result Value Ref Range Status   Specimen Description BLOOD ARM RIGHT  Final   Special Requests BOTTLES DRAWN AEROBIC AND ANAEROBIC 5CC  Final   Culture   Final           BLOOD CULTURE RECEIVED NO GROWTH TO DATE CULTURE WILL BE HELD FOR 5 DAYS BEFORE ISSUING A FINAL NEGATIVE REPORT Performed at [facility]    Report Status PENDING  Incomplete     Studies: Dg Chest 2 View  11/25/2014   CLINICAL DATA:  Shortness of breath, difficulty breathing  EXAM: CHEST  2 VIEW  COMPARISON:  11/03/2013  FINDINGS: Increased right lower lobe consolidation with moderate right pleural effusion noted. Lungs are hyperinflated suggesting emphysema. Linear left lower lung zone scarring is stable. Calcified right lower lobe granulomas are reidentified. Bones are subjectively osteopenic. No thoracic compression deformity is identified. Trace left pleural fluid is present. Left-sided dual lead pacer in place.  IMPRESSION: Increased right basilar consolidation and moderate right pleural effusion suspicious for pneumonia.   Electronically Signed   By: Conchita Paris M.D.   On: 11/25/2014 19:01   Ct Head Wo Contrast  11/25/2014   CLINICAL DATA:  [address].  Hit head.  EXAM: CT HEAD WITHOUT CONTRAST  TECHNIQUE: Contiguous axial images were obtained from the base of the skull through the vertex without intravenous contrast.  COMPARISON:  09/09/2011  FINDINGS: Stable age related cerebral atrophy, ventriculomegaly and periventricular white matter disease. No  extra-axial fluid collections are identified. No CT findings for acute hemispheric infarction or intracranial hemorrhage. No mass lesions. The brainstem and cerebellum are normal.  No acute skull fracture. The paranasal sinuses and mastoid air cells are grossly clear. The globes are intact.  IMPRESSION: No acute intracranial findings or skull fracture.   Electronically Signed   By: Kalman Jewels M.D.   On: 11/25/2014 19:56    Scheduled Meds: . aspirin EC  81 mg Oral Daily  . azaTHIOprine  50 mg Oral TID  . docusate sodium  100 mg Oral BID  . enoxaparin (LOVENOX) injection  40 mg Subcutaneous QHS  . escitalopram  20 mg Oral Daily  . finasteride  5 mg Oral Daily  . folic acid  1 mg Oral Daily  . furosemide  60 mg Oral Daily  . LORazepam  0-4 mg Intravenous Q6H   Followed by  . LORazepam  0-4 mg Intravenous Q12H  . multivitamin with minerals  1 tablet Oral Daily  . pantoprazole  40 mg Oral Daily  . sodium chloride  3 mL Intravenous Q12H  . spironolactone  25 mg Oral Daily  . thiamine  100 mg Oral Daily   Or  .  thiamine  100 mg Intravenous Daily   Continuous Infusions:      Time spent: 35 minutes    [facility] A  [facility] Pager [phone] If 7PM-7AM, please contact night-coverage at [email or web address], password [facility] 11/27/2014, 4:05 PM  LOS: 2 days

## 2014-11-28 LAB — BASIC METABOLIC PANEL
Anion gap: 8 (ref 5–15)
BUN: 15 mg/dL (ref 6–23)
CALCIUM: 8.4 mg/dL (ref 8.4–10.5)
CO2: 30 mmol/L (ref 19–32)
CREATININE: 0.67 mg/dL (ref 0.50–1.35)
Chloride: 99 mmol/L (ref 96–112)
GFR calc Af Amer: >90 mL/min (ref >90)
GFR calc non Af Amer: 86 mL/min — ABNORMAL LOW (ref >90)
Glucose, Bld: 124 mg/dL — ABNORMAL HIGH (ref 70–99)
POTASSIUM: 3.6 mmol/L (ref 3.5–5.1)
Sodium: 137 mmol/L (ref 135–145)

## 2014-11-28 NOTE — Progress Notes (Signed)
[facility] PROGRESS NOTE   [name] [name] IPJ:[id number] DOB: [birth year] DOA: 11/25/2014 PCP: [name], MD  HPI/Subjective: Much better, still needs oxygen, walked with PT yesterday and he required oxygen. Spoke with the patient in the presence of his daughter at bedside.  Assessment/Plan: Principal Problem:   CAP (community acquired pneumonia): Probable Active Problems:   Essential hypertension, benign   Atrial fibrillation   CAD (coronary artery disease)   Chronic systolic CHF (congestive heart failure)   Alcohol abuse   COPD (chronic obstructive pulmonary disease)   Anemia due to other cause    CAP (community acquired pneumonia) Presented with fever, cough, sputum production and shortness of breath. CXR showed right lower lobe infiltrates and small effusion consistent with pneumonia. Patient is started on azithromycin and Rocephin, continue current antibiotics. Continue supportive management with antitussives, bronchodilators, mucolytics and oxygen as needed. Continue current regimen, try to wean oxygen, PT/OT to evaluate.  COPD (chronic obstructive pulmonary disease) No wheezing, had 1 dose of steroids in the ED, did not continue steroids. Monitor closely, continue bronchodilators.  Chronic systolic CHF (congestive heart failure) Patient is on Lasix and spironolactone, continued. Currently no symptoms or signs of decompensation, follow closely. No changes.  CAD (coronary artery disease) Continue ASA, no acute issues.  Atrial fibrillation Rate is controlled, not on anticoagulation. Continue aspirin.  Alcohol abuse Reported by his daughter that he abuses alcohol. Started on CIWA protocol.  Essential hypertension, benign Continue home medications  Social issues Spoke with patient daughter Mrs. Rebecca at bedside, she has a lot of concerns about the patient. Patient apparently is order, his house is in pretty bad shape, with plumbing problems and  mold issues. The daughter very concerned that he needs more help than going back to home. She is concerned also he is abusing his pain medications and drinking in the same time. I tried to talk to the patient about going to skilled nursing facility or at least assisted living facility, patient refused. Patient is as far as I can tell he can make his own decisions, he knows the consequences exactly. 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 Patient refused to any place other than home, home health PT will be ordered.   Code Status: Full Code Family Communication: Plan discussed with the patient. Disposition Plan: Remains inpatient Diet: Diet Heart  Consultants:  None  Procedures:  None  Antibiotics:  Rocephin and azithromycin   Objective: Filed Vitals:   11/28/14 1447  BP: 97/72  Pulse:   Temp: 99.2 F (37.3 C)  Resp: 16    Intake/Output Summary (Last 24 hours) at 11/28/14 1617 Last data filed at 11/28/14 1449  Gross per 24 hour  Intake    958 ml  Output   3475 ml  Net  -2517 ml   Filed Weights   11/25/14 2140 11/27/14 0613 11/28/14 0609  Weight: 73.755 kg (162 lb 9.6 oz) 70.761 kg (156 lb) 68.5 kg (151 lb 0.2 oz)    Exam: General: Alert and awake, oriented x3, not in any acute distress. HEENT: anicteric sclera, pupils reactive to light and accommodation, EOMI CVS: S1-S2 clear, no murmur rubs or gallops Chest: clear to auscultation bilaterally, no wheezing, rales or rhonchi Abdomen: soft nontender, nondistended, normal bowel sounds, no organomegaly Extremities: no cyanosis, clubbing or edema noted bilaterally Neuro: Cranial nerves II-XII intact, no focal neurological deficits  Data Reviewed: Basic Metabolic Panel:  Recent Labs Lab 11/25/14 1732 11/26/14 0455 11/28/14 0830  NA  136 136 137  K 4.6 4.4 3.6  CL 103 102 99  CO2 26 26 30   GLUCOSE 116* 218* 124*  BUN 13 13 15   CREATININE 0.61 0.61 0.67  CALCIUM 8.8 8.4  8.4   Liver Function Tests: No results for input(s): AST, ALT, ALKPHOS, BILITOT, PROT, ALBUMIN in the last 168 hours. No results for input(s): LIPASE, AMYLASE in the last 168 hours. No results for input(s): AMMONIA in the last 168 hours. CBC:  Recent Labs Lab 11/25/14 1732 11/26/14 0455  WBC 6.8 4.7  HGB 10.5* 10.0*  HCT 31.2* 29.9*  MCV 98.4 94.6  PLT 319 340   Cardiac Enzymes: No results for input(s): CKTOTAL, CKMB, CKMBINDEX, TROPONINI in the last 168 hours. BNP (last 3 results)  Recent Labs  11/25/14 1732  BNP 660.0*    ProBNP (last 3 results) No results for input(s): PROBNP in the last 8760 hours.  CBG: No results for input(s): GLUCAP in the last 168 hours.  Micro Recent Results (from the past 240 hour(s))  Blood culture (routine x 2)     Status: None (Preliminary result)   Collection Time: 11/25/14  7:55 PM  Result Value Ref Range Status   Specimen Description BLOOD FOREARM LEFT  Final   Special Requests BOTTLES DRAWN AEROBIC AND ANAEROBIC 5CC  Final   Culture   Final           BLOOD CULTURE RECEIVED NO GROWTH TO DATE CULTURE WILL BE HELD FOR 5 DAYS BEFORE ISSUING A FINAL NEGATIVE REPORT Performed at [facility]    Report Status PENDING  Incomplete  Blood culture (routine x 2)     Status: None (Preliminary result)   Collection Time: 11/25/14  8:00 PM  Result Value Ref Range Status   Specimen Description BLOOD ARM RIGHT  Final   Special Requests BOTTLES DRAWN AEROBIC AND ANAEROBIC 5CC  Final   Culture   Final           BLOOD CULTURE RECEIVED NO GROWTH TO DATE CULTURE WILL BE HELD FOR 5 DAYS BEFORE ISSUING A FINAL NEGATIVE REPORT Performed at [facility]    Report Status PENDING  Incomplete     Studies: No results found.  Scheduled Meds: . aspirin EC  81 mg Oral Daily  . azaTHIOprine  50 mg Oral TID  . docusate sodium  100 mg Oral BID  . enoxaparin (LOVENOX) injection  40 mg Subcutaneous QHS  . escitalopram  20 mg Oral Daily  .  finasteride  5 mg Oral Daily  . folic acid  1 mg Oral Daily  . furosemide  60 mg Oral Daily  . LORazepam  0-4 mg Intravenous Q12H  . multivitamin with minerals  1 tablet Oral Daily  . pantoprazole  40 mg Oral Daily  . sodium chloride  3 mL Intravenous Q12H  . spironolactone  25 mg Oral Daily  . thiamine  100 mg Oral Daily   Or  . thiamine  100 mg Intravenous Daily   Continuous Infusions:      Time spent: 35 minutes    [facility]  [facility] Pager [phone] If 7PM-7AM, please contact night-coverage at [email or web address], password [facility] 11/28/2014, 4:17 PM  LOS: 3 days

## 2014-11-28 NOTE — Clinical Social Work Psychosocial (Signed)
Clinical Social Work Department BRIEF PSYCHOSOCIAL ASSESSMENT 11/28/2014  Patient:  [name] [name], [name] [name]     Account Number:  [id number]     Admit date:  11/25/2014  Clinical Social Worker:  Lovey Newcomer  Date/Time:  11/28/2014 04:15 PM  Referred by:  Physician  Date Referred:  11/28/2014 Referred for  Other - See comment   Other Referral:   NA   Interview type:  Patient Other interview type:   Patient and patient's daughter interviewed to complete assessment.    PSYCHOSOCIAL DATA Living Status:  ALONE Admitted from facility:   Level of care:   Primary support name:  [name] [name] Primary support relationship to patient:  CHILD, ADULT Degree of support available:   Support is fair.    CURRENT CONCERNS Current Concerns  Other - See comment   Other Concerns:   Patient not appropriate for SNF placement but daughter is insistent on patient NOT returning home as she reports it's an unsafe living environment.    SOCIAL WORK ASSESSMENT / PLAN CSW met with daughter in hallway per daughter's request. 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 The photos showed immense clutter and unsanitary conditions in the patients home and the patient states that he does truly live this way. Daughter feels responsible for the patient and is frustrated that the patient wants to return home to live in this condition. CSW explained to the daughter that the patient has the capacity to make his own decisions and has been refusing any kind of placement. CSW explained that the daughter can contact APS if she feels the patient is truly in danger.     CSW met with patient at bedside to discuss situation. Patient confirms what daughter reports but states that he will not be going to a facility at discharge and does not need any help. Patient explains that he manages fine at home and feels the daughter is overreacting to the situation. Patient states that  he wants to return home but would be fine with [facility] services coming to the home. Patient shared many stories about his life with CSW and explains that his greatest pleasure, collecting records, is what has led to the clutter in his home. He states that he has two friends who will be helping move the records out of the home. Patient doesn't report feeling unsafe at home and is persistent in refusing any assistance that may be available to him beyong HH. Overall patient was smiling and in good spirits, and reports feeling anxious to get out of the hospital.   Assessment/plan status:  No Further Intervention Required Other assessment/ plan:   NONE   Information/referral to community resources:   NONE    PATIENT'S/FAMILY'S RESPONSE TO PLAN OF CARE: Patient plans to return home at discharge. The patient has the ability to make his own decisions and understands the consequences of his decisions. Although patient's living situation is potentially usafe, he has the right to self determination and to return to that environment if we wishes. CSW will sign off at this time.       Liz Beach MSW, [place], [place], [phone]

## 2014-11-28 NOTE — Progress Notes (Signed)
Medicare Important Message given?  YES (If response is "NO", the following Medicare IM given date fields will be blank) Date Medicare IM given:  11/28/14 Medicare IM given by:  Marlane Hirschmann 

## 2014-11-28 NOTE — Progress Notes (Signed)
Chaplain responded to consult.  Pt introduced self as "Secular Humanist," to chaplain.  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  Pt also shared he lost wife in aircraft accident 10 years ago.  Pt describes having fond memories of childhood, "couldn't have had a better childhood, just couldn't."  Chaplain provided emotional support and will continue to follow up as needed.    11/28/14 1600  Clinical Encounter Type  Visited With Patient  Visit Type Initial;Social support  Referral From Physician  Spiritual Encounters  Spiritual Needs Emotional  Stress Factors  Patient Stress Factors Exhausted;Family relationships;Health changes   Geralyn Flash 11/28/2014 4:58 PM

## 2014-11-29 ENCOUNTER — Encounter (HOSPITAL_COMMUNITY): Payer: Self-pay | Admitting: Internal Medicine

## 2014-11-29 DIAGNOSIS — F423 Hoarding disorder: Secondary | ICD-10-CM | POA: Diagnosis present

## 2014-11-29 MED ORDER — LEVOFLOXACIN 750 MG PO TABS: 750.0000 mg | ORAL_TABLET | Freq: Every day | ORAL | Status: DC

## 2014-11-29 NOTE — Progress Notes (Signed)
CARE MANAGEMENT NOTE 11/29/2014  Patient:  [facility]   Account Number:  [id number]  Date Initiated:  11/29/2014  Documentation initiated by:  [facility]  Subjective/Objective Assessment:   CAP (community acquired pneumonia)     Action/Plan:   Anticipated DC Date:  11/29/2014   Anticipated DC Plan:  [place] referral  Clinical Social Worker      DC Forensic scientist  CM consult      [facility] Choice  HOME HEALTH   Choice offered to / List presented to:  C-1 Patient        [place] arranged  [place] - 11 Patient Refused      Status of service:  Completed, signed off Medicare Important Message given?  YES (If response is "NO", the following Medicare IM given date fields will be blank) Date Medicare IM given:  11/29/2014 Medicare IM given by:  [facility] Date Additional Medicare IM given:   Additional Medicare IM given by:    Discharge Disposition:  HOME/SELF CARE  Per UR Regulation:    If discussed at Long Length of Stay Meetings, dates discussed:    Comments:  11/29/2014 1715 Attempted call to pt's home number and no answer. Contacted dtr, she states it takes him a long time to get to the phone. NCM spoke to dtr, Zettie Cooley # [phone], offered choice for [facility]. 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 He is independent at home with their assistance. States her and her boyfriend checks on him daily and takes him to his appts. States she lives a short distance from his home. She moved back to town to ensure her father was taking care of at home. States he does not want to go to a SNF. Explained [facility] can come out to evaluate the home for safety. She is refusing HH at this time. Explained to dtr that the PCP can arrange [facility] from the office and if she decides that [facility] is appropriate. Verbalized understanding. Jonnie Finner RN CCM Case Mgmt phone [phone]

## 2014-11-29 NOTE — Progress Notes (Signed)
Physical Therapy Treatment Patient Details Name: [name] [name] MRN: [id number] DOB: 05/05/1929 Today's Date: 11/29/2014    History of Present Illness [name] [name] is a 81 y.o. male with a history of CAD, PAF, HTN, Systolic CHF, Cirrhosis, ETOH Abuse, and RA who presents to the Ed with complaints of worsening SOB x 2 days. He reports nonproductive cough, and denies fever and chills. In the ED, a Chest X-ray revealed a Right basilar opacity and he was placed on antibiotic coverage for CAP and referred for admission.  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  She also reports that he is a very dramatic person and very manipulative and is very volatile.     A Case managament consultation has been ordered.     PT Comments    Improving activity tolerance with less need for supplemental O2; stressed to pt the importance of using RW as the sequelae of a fall with a broken hip can be devastating;   Continue to note Daughter's concerns; Feel it is worth considering HHSW follow up as well as HHPT, and HHRN for chronic disease management   Follow Up Recommendations  Home health PT;Other (comment) [facility] for chronic disease management; [place] home safety)     Equipment Recommendations  Rolling walker with 5" wheels;3in1 (PT) (likely already has)    Recommendations for Other Services OT consult     Precautions / Restrictions Precautions Precautions: Fall Restrictions Weight Bearing Restrictions: No    Mobility  Bed Mobility Overal bed mobility: Modified Independent             General bed mobility comments: used rails  Transfers   Equipment used: Rolling walker (2 wheeled) Transfers: Sit to/from Stand Sit to Stand: Supervision         General transfer comment: Supervision for safety given recent fall in  Radiology  Ambulation/Gait Ambulation/Gait assistance: Supervision Ambulation Distance (Feet): 350 Feet Assistive device: Rolling walker (2 wheeled) Gait Pattern/deviations: Step-through pattern;Trunk flexed     General Gait Details: Overall improved from last progressive walk; conducted on Room Air and O2 sats remained at acceptable levels throughout, ranging from 90-99%; Cues for fully upright posture; noed head and shoulders forward   Stairs            Wheelchair Mobility    Modified Rankin (Stroke Patients Only)       Balance               Standing balance comment: Definitely benefits form UE support for balance during amb                    Cognition Arousal/Alertness: Awake/alert Behavior During Therapy: WFL for tasks assessed/performed Overall Cognitive Status: Within Functional Limits for tasks assessed                      Exercises      General Comments        Pertinent Vitals/Pain Pain Assessment: 0-10 Pain Score: 7  Pain Location: L hip, knees, reports is always in pain Pain Descriptors / Indicators: Constant Pain Intervention(s): Monitored during session    Home Living                      Prior Function  PT Goals (current goals can now be found in the care plan section) Acute Rehab PT Goals Patient Stated Goal: to get better PT Goal Formulation: With patient Time For Goal Achievement: 12/11/14 Potential to Achieve Goals: Good Progress towards PT goals: Progressing toward goals (No great need for supplemental O2)    Frequency  Min 3X/week    PT Plan Current plan remains appropriate    Co-evaluation             End of Session   Activity Tolerance: Patient tolerated treatment well Patient left: in chair;with call bell/phone within reach     Time: 0900-0928 PT Time Calculation (min) (ACUTE ONLY): 28 min  Charges:  $Gait Training: 23-37 mins                    G Codes:       [name] 11/29/2014, 11:20 AM  [name], [place] Pager [phone] Office [phone]

## 2014-11-29 NOTE — Discharge Summary (Signed)
Physician Discharge Summary  [name] [name]:[id number] DOB: [birth year] DOA: 11/25/2014  PCP: [name], MD  Admit date: 11/25/2014 Discharge date: 11/29/2014  Time spent: 45 minutes  Recommendations for Outpatient Follow-up:  1. Follow-up with primary care physician within one week. 2. Home health PT/OT and social worker to follow.  Discharge Diagnoses:  Principal Problem:   CAP (community acquired pneumonia): Probable Active Problems:   Essential hypertension, benign   Atrial fibrillation   CAD (coronary artery disease)   Chronic systolic CHF (congestive heart failure)   Alcohol abuse   COPD (chronic obstructive pulmonary disease)   Anemia due to other cause   Discharge Condition: Stable  Diet recommendation: Heart healthy  Filed Weights   11/27/14 0613 11/28/14 0609 11/29/14 0505  Weight: 70.761 kg (156 lb) 68.5 kg (151 lb 0.2 oz) 67.6 kg (149 lb 0.5 oz)    History of present illness:  [name] [name] is a 81 y.o. male with a history of CAD, PAF, HTN, Systolic CHF, Cirrhosis, ETOH Abuse, and RA who presents to the Ed with complaints of worsening SOB x 2 days. He reports nonproductive cough, and denies fever and chills. In the ED, a Chest X-ray revealed a Right basilar opacity and he was placed on antibiotic coverage for CAP and referred for admission. 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 She also reports that he is a very dramatic person and very manipulative and is very volatile. A Case managament consultation has been ordered.   Of note: Patient suffered a fall while he was in X-Ray, and hit The back of his head. A CT scan was subsequently done and was negative for acute findings.    Hospital Course:   CAP (community acquired pneumonia) Presented with fever, cough, sputum production and shortness of breath. CXR  showed right lower lobe infiltrates and small effusion consistent with pneumonia. Was on supportive management with antitussives, bronchodilators, mucolytics and oxygen as needed. Patient treated with azithromycin and Rocephin while he is in the hospital Discharged on levofloxacin 750 mg for 4 more days to complete total 1 week of antibiotics.  COPD (chronic obstructive pulmonary disease) No wheezing, had 1 dose of steroids in the ED, did not continue steroids. Patient did okay, there was mention from the daughter there is mold in his house but no wheezing when he came in.  Chronic systolic CHF (congestive heart failure) Patient is on Lasix and spironolactone, continued. Currently no symptoms or signs of decompensation, follow closely. No changes.  CAD (coronary artery disease) Continue ASA, no acute issues.  Atrial fibrillation Rate is controlled, not on anticoagulation, because of previous falls. Continue aspirin.  Alcohol abuse Reported by his daughter that he abuses alcohol. Started on CIWA protocol.  Essential hypertension, benign Continue home medications  Social issues Spoke with patient daughter Mrs. [name] [name] at bedside, she has a lot of concerns about the patient. Patient apparently is hoarder, his house is in pretty bad shape, with plumbing problems and mold issues. The daughter very concerned that he needs more help than going back to home. She is concerned also he is abusing his pain medications and drinking in the same time. I tried to talk to the patient about going to skilled nursing facility or at least assisted living facility, patient refused. Patient is as far as I can tell, he  can make his own decisions, he knows the consequences exactly. 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 Patient refused to go to any place other than home, home health PT/OT/SW will be ordered.   Procedures:  None  Consultations:  None  Discharge  Exam: Filed Vitals:   11/29/14 1050  BP: 110/82  Pulse:   Temp: 99.8 F (37.7 C)  Resp: 20    General: Alert and awake, oriented x3, not in any acute distress. HEENT: anicteric sclera, pupils reactive to light and accommodation, EOMI CVS: S1-S2 clear, no murmur rubs or gallops Chest: clear to auscultation bilaterally, no wheezing, rales or rhonchi Abdomen: soft nontender, nondistended, normal bowel sounds, no organomegaly Extremities: no cyanosis, clubbing or edema noted bilaterally Neuro: Cranial nerves II-XII intact, no focal neurological deficits  Discharge Instructions   Discharge Instructions    Diet - low sodium heart healthy    Complete by:  As directed      Face-to-face encounter (required for Medicare/Medicaid patients)    Complete by:  As directed   I Cayley Pester A certify that this patient is under my care and that I, or a nurse practitioner or physician's assistant working with me, had a face-to-face encounter that meets the physician face-to-face encounter requirements with this patient on 11/29/2014. The encounter with the patient was in whole, or in part for the following medical condition(s) which is the primary reason for home health care (List medical condition): Pneumonia  The encounter with the patient was in whole, or in part, for the following medical condition, which is the primary reason for home health care:  Pneumonia  I certify that, based on my findings, the following services are medically necessary home health services:  Physical therapy  Reason for Medically Necessary Home Health Services:  Therapy- Therapeutic Exercises to Increase Strength and Endurance  My clinical findings support the need for the above services:  Shortness of breath with activity  Further, I certify that my clinical findings support that this patient is homebound due to:  Shortness of Breath with activity     Home Health    Complete by:  As directed   To provide the following  care/treatments:   PT OT Social work       Increase activity slowly    Complete by:  As directed           Current Discharge Medication List    START taking these medications   Details  levofloxacin (LEVAQUIN) 750 MG tablet Take 1 tablet (750 mg total) by mouth daily. Qty: 4 tablet, Refills: 0      CONTINUE these medications which have NOT CHANGED   Details  aspirin EC 81 MG tablet Take 81 mg by mouth daily.    azaTHIOprine (IMURAN) 50 MG tablet Take 50 mg by mouth 3 (three) times daily.     budesonide-formoterol (SYMBICORT) 160-4.5 MCG/ACT inhaler Inhale 2 puffs into the lungs 2 (two) times daily.    docusate sodium (COLACE) 100 MG capsule Take 100 mg by mouth 2 (two) times daily as needed for constipation.    escitalopram (LEXAPRO) 20 MG tablet Take 20 mg by mouth daily.     esomeprazole (NEXIUM) 40 MG capsule Take 40 mg by mouth 2 (two) times daily before a meal.     FIBER PO Take 1 tablet by mouth 2 (two) times daily with a meal.     finasteride (PROSCAR) 5 MG tablet Take 5 mg by mouth daily.  furosemide (LASIX) 40 MG tablet Take 1.5 tablets (60 mg total) by mouth daily. Qty: 60 tablet, Refills: 1    lidocaine (XYLOCAINE) 2 % solution Use as directed 20 mLs in the mouth or throat every 4 (four) hours as needed (swallowing difficulties for 5 days). Qty: 100 mL, Refills: 0    Omega-3 Fatty Acids (FISH OIL) 1000 MG CAPS Take 1 capsule by mouth daily.    oxyCODONE-acetaminophen (PERCOCET/ROXICET) 5-325 MG per tablet Take 1-2 tablets by mouth every 6 (six) hours as needed for moderate pain.     silver sulfADIAZINE (SILVADENE) 1 % cream Apply 1 application topically daily.     spironolactone (ALDACTONE) 50 MG tablet Take 25 mg by mouth daily.     temazepam (RESTORIL) 15 MG capsule Take 15 mg by mouth at bedtime. For sleep    triamcinolone cream (KENALOG) 0.1 % Apply 1 application topically daily.        Allergies  Allergen Reactions  . Celebrex [Celecoxib]  Nausea And Vomiting and Other (See Comments)    Stomach ulcers and bleeding  . Alendronate Sodium Other (See Comments)    Unknown.  . Benazepril Other (See Comments)    Unknown.  . Remicade [Infliximab] Other (See Comments)   Follow-up Information    Follow up with PERINI,MARK A, MD In 1 week.   Specialty:  Internal Medicine   Contact information:   [place] [address] [phone]        The results of significant diagnostics from this hospitalization (including imaging, microbiology, ancillary and laboratory) are listed below for reference.    Significant Diagnostic Studies: Dg Chest 2 View  11/25/2014   CLINICAL DATA:  Shortness of breath, difficulty breathing  EXAM: CHEST  2 VIEW  COMPARISON:  11/03/2013  FINDINGS: Increased right lower lobe consolidation with moderate right pleural effusion noted. Lungs are hyperinflated suggesting emphysema. Linear left lower lung zone scarring is stable. Calcified right lower lobe granulomas are reidentified. Bones are subjectively osteopenic. No thoracic compression deformity is identified. Trace left pleural fluid is present. Left-sided dual lead pacer in place.  IMPRESSION: Increased right basilar consolidation and moderate right pleural effusion suspicious for pneumonia.   Electronically Signed   By: Conchita Paris M.D.   On: 11/25/2014 19:01   Ct Head Wo Contrast  11/25/2014   CLINICAL DATA:  [place] [address].  Hit head.  EXAM: CT HEAD WITHOUT CONTRAST  TECHNIQUE: Contiguous axial images were obtained from the base of the skull through the vertex without intravenous contrast.  COMPARISON:  09/09/2011  FINDINGS: Stable age related cerebral atrophy, ventriculomegaly and periventricular white matter disease. No extra-axial fluid collections are identified. No CT findings for acute hemispheric infarction or intracranial hemorrhage. No mass lesions. The brainstem and cerebellum are normal.  No acute skull fracture. The paranasal sinuses and  mastoid air cells are grossly clear. The globes are intact.  IMPRESSION: No acute intracranial findings or skull fracture.   Electronically Signed   By: Kalman Jewels M.D.   On: 11/25/2014 19:56    Microbiology: Recent Results (from the past 240 hour(s))  Blood culture (routine x 2)     Status: None (Preliminary result)   Collection Time: 11/25/14  7:55 PM  Result Value Ref Range Status   Specimen Description BLOOD FOREARM LEFT  Final   Special Requests BOTTLES DRAWN AEROBIC AND ANAEROBIC 5CC  Final   Culture   Final           BLOOD CULTURE RECEIVED NO GROWTH  TO DATE CULTURE WILL BE HELD FOR 5 DAYS BEFORE ISSUING A FINAL NEGATIVE REPORT Performed at [facility]    Report Status PENDING  Incomplete  Blood culture (routine x 2)     Status: None (Preliminary result)   Collection Time: 11/25/14  8:00 PM  Result Value Ref Range Status   Specimen Description BLOOD ARM RIGHT  Final   Special Requests BOTTLES DRAWN AEROBIC AND ANAEROBIC 5CC  Final   Culture   Final           BLOOD CULTURE RECEIVED NO GROWTH TO DATE CULTURE WILL BE HELD FOR 5 DAYS BEFORE ISSUING A FINAL NEGATIVE REPORT Performed at [facility]    Report Status PENDING  Incomplete     Labs: Basic Metabolic Panel:  Recent Labs Lab 11/25/14 1732 11/26/14 0455 11/28/14 0830  NA 136 136 137  K 4.6 4.4 3.6  CL 103 102 99  CO2 26 26 30   GLUCOSE 116* 218* 124*  BUN 13 13 15   CREATININE 0.61 0.61 0.67  CALCIUM 8.8 8.4 8.4   Liver Function Tests: No results for input(s): AST, ALT, ALKPHOS, BILITOT, PROT, ALBUMIN in the last 168 hours. No results for input(s): LIPASE, AMYLASE in the last 168 hours. No results for input(s): AMMONIA in the last 168 hours. CBC:  Recent Labs Lab 11/25/14 1732 11/26/14 0455  WBC 6.8 4.7  HGB 10.5* 10.0*  HCT 31.2* 29.9*  MCV 98.4 94.6  PLT 319 340   Cardiac Enzymes: No results for input(s): CKTOTAL, CKMB, CKMBINDEX, TROPONINI in the last 168 hours. BNP: BNP  (last 3 results)  Recent Labs  11/25/14 1732  BNP 660.0*    ProBNP (last 3 results) No results for input(s): PROBNP in the last 8760 hours.  CBG: No results for input(s): GLUCAP in the last 168 hours.     Signed:  Rusty Glodowski A  [facility] 11/29/2014, 12:49 PM

## 2014-12-02 LAB — CULTURE, BLOOD (ROUTINE X 2)
Culture: NO GROWTH
Culture: NO GROWTH

## 2015-01-09 ENCOUNTER — Encounter: Payer: Medicare Other | Admitting: *Deleted

## 2015-01-09 ENCOUNTER — Telehealth: Payer: Self-pay | Admitting: Cardiology

## 2015-01-09 NOTE — Telephone Encounter (Signed)
LMOVM reminding pt to send remote transmission.   

## 2015-01-10 ENCOUNTER — Encounter: Payer: Self-pay | Admitting: Cardiology

## 2015-03-06 ENCOUNTER — Ambulatory Visit (INDEPENDENT_AMBULATORY_CARE_PROVIDER_SITE_OTHER): Payer: Medicare Other | Admitting: Cardiology

## 2015-03-06 ENCOUNTER — Encounter: Payer: Self-pay | Admitting: Cardiology

## 2015-03-06 VITALS — BP 124/70 | HR 62 | Ht 70.0 in | Wt 163.8 lb

## 2015-03-06 DIAGNOSIS — I4891 Unspecified atrial fibrillation: Secondary | ICD-10-CM | POA: Diagnosis not present

## 2015-03-06 DIAGNOSIS — I442 Atrioventricular block, complete: Secondary | ICD-10-CM

## 2015-03-06 DIAGNOSIS — I2510 Atherosclerotic heart disease of native coronary artery without angina pectoris: Secondary | ICD-10-CM | POA: Diagnosis not present

## 2015-03-06 DIAGNOSIS — I5022 Chronic systolic (congestive) heart failure: Secondary | ICD-10-CM

## 2015-03-06 NOTE — Patient Instructions (Signed)
Watch your salt intake   I will see you in 6 months

## 2015-03-06 NOTE — Progress Notes (Signed)
[name] [name] Date of Birth: 09-Dec-1928 Medical Record #[id number]  History of Present Illness: [name] [name] is seen back today for follow up CHF.  He has CAD - managed medically, HTN,  chronic AF, CHB with PPM in place, GERD, COPD, RA, myasthenia gravis, chronic pain, history of alcohol abuse and cirrhosis.  He has a history of systolic HF - prior Echo showed EF is 40 to 45%. Myoview was ok. Not on anticoagulation due to falls.   Since his hospitalization in January 2015 he has been compliant with lasix. He was admitted twice this year with PNA. Does not add salt to food but ends up eating a lot of microwave dinners. Weight is up but he thinks this is due to eating better. No dyspnea or chest pain. No increase in edema. Complains of sore throat and is worried because his father died at age 16 with esophageal CA.  Current Outpatient Prescriptions  Medication Sig Dispense Refill  . aspirin EC 81 MG tablet Take 81 mg by mouth daily.    Marland Kitchen azaTHIOprine (IMURAN) 50 MG tablet Take 50 mg by mouth 3 (three) times daily.     . budesonide-formoterol (SYMBICORT) 160-4.5 MCG/ACT inhaler Inhale 2 puffs into the lungs 2 (two) times daily.    Marland Kitchen docusate sodium (COLACE) 100 MG capsule Take 100 mg by mouth 2 (two) times daily as needed for constipation.    Marland Kitchen escitalopram (LEXAPRO) 20 MG tablet Take 20 mg by mouth daily.     Marland Kitchen esomeprazole (NEXIUM) 40 MG capsule Take 40 mg by mouth 2 (two) times daily before a meal.     . FIBER PO Take 1 tablet by mouth 2 (two) times daily with a meal.     . finasteride (PROSCAR) 5 MG tablet Take 5 mg by mouth daily.     . furosemide (LASIX) 40 MG tablet Take 1.5 tablets (60 mg total) by mouth daily. (Patient taking differently: Take 40 mg by mouth daily. ) 60 tablet 1  . KLOR-CON M20 20 MEQ tablet Take 1 tablet by mouth daily.  5  . lidocaine (XYLOCAINE) 2 % solution Use as directed 20 mLs in the mouth or throat every 4 (four) hours as needed (swallowing difficulties for 5  days). 100 mL 0  . Omega-3 Fatty Acids (FISH OIL) 1000 MG CAPS Take 1 capsule by mouth daily.    Marland Kitchen oxyCODONE-acetaminophen (PERCOCET/ROXICET) 5-325 MG per tablet Take 1-2 tablets by mouth every 6 (six) hours as needed for moderate pain.     . silver sulfADIAZINE (SILVADENE) 1 % cream Apply 1 application topically daily.     Marland Kitchen spironolactone (ALDACTONE) 50 MG tablet Take 25 mg by mouth daily.     . temazepam (RESTORIL) 15 MG capsule Take 15 mg by mouth at bedtime. For sleep    . triamcinolone cream (KENALOG) 0.1 % Apply 1 application topically daily.      No current facility-administered medications for this visit.    Allergies  Allergen Reactions  . Celebrex [Celecoxib] Nausea And Vomiting and Other (See Comments)    Stomach ulcers and bleeding  . Alendronate Sodium Other (See Comments)    Unknown.  . Benazepril Other (See Comments)    Unknown.  . Remicade [Infliximab] Other (See Comments)    Past Medical History  Diagnosis Date  . Complete heart block     s/p PPM  . PAF (paroxysmal atrial fibrillation)   . HTN (hypertension)   . HLD (hyperlipidemia)   . CAD (  coronary artery disease)   . Depression   . Anxiety   . Hemorrhoids, external   . GERD (gastroesophageal reflux disease)   . Hyperplastic colonic polyp   . Diverticulosis of colon   . COPD (chronic obstructive pulmonary disease)   . Myasthenia gravis   . Arthritis, rheumatoid   . BPH (benign prostatic hypertrophy)   . Cirrhosis of liver   . Asthma   . Alcohol abuse   . Hiatal hernia   . Skin cancer of forehead     and left ear  . Lung nodule   . Nephrolithiasis   . CHF (congestive heart failure)   . Pacemaker   . Herpes zoster   . Hoarding disorder with poor insight     Past Surgical History  Procedure Laterality Date  . Total knee arthroplasty      bilateral  . Total hip arthroplasty      x3  . Splenectomy    . Appendectomy    . Pacemaker insertion  2009    medtronic  . Cardiac catheterization   10/12/2007    EF 60%  . Inguinal hernia repair    . Esophagogastroduodenoscopy N/A 09/23/2014    Procedure: ESOPHAGOGASTRODUODENOSCOPY (EGD);  Surgeon: Ladene Artist, MD;  Location: [facility];  Service: Endoscopy;  Laterality: N/A;  . Savory dilation N/A 09/23/2014    Procedure: SAVORY DILATION;  Surgeon: Ladene Artist, MD;  Location: [facility];  Service: Endoscopy;  Laterality: N/A;    History  Smoking status  . Former Smoker -- 71 years  . Types: Cigars  . Quit date: 10/22/1987  Smokeless tobacco  . Never Used    History  Alcohol Use  . 8.4 oz/week  . 14 Cans of beer per week    Family History  Problem Relation Age of Onset  . Myasthenia gravis Mother   . Esophageal cancer Father   . HIV Son     Aids  . Diabetes Son   . Heart disease Maternal Uncle   . Colon cancer Other     Cousin on Mother's side  . Colon polyps Neg Hx     Review of Systems: The review of systems is per the HPI.  All other systems were reviewed and are negative.  Physical Exam: BP 124/70 mmHg  Pulse 62  Ht 5\' 10"  (1.778 m)  Wt 163 lb 12.8 oz (74.299 kg)  BMI 23.50 kg/m2 Patient is alert and in no acute distress.  Color is normal.  HEENT is unremarkable. No adenopathy or masses. Normocephalic/atraumatic. PERRL. Sclera are nonicteric. Neck is supple. No masses. No JVD. Lungs are clear. Cardiac exam shows a  regular rate and rhythm. No gallop. Abdomen is soft. Extremities are firm with 1+ edema - below the knee mostly R>L.  Gait and ROM are intact. No gross neurologic deficits noted.   Wt Readings from Last 3 Encounters:  03/06/15 163 lb 12.8 oz (74.299 kg)  11/29/14 149 lb 0.5 oz (67.6 kg)  09/21/14 155 lb 8 oz (70.534 kg)     LABORATORY DATA:    Chemistry      Component Value Date/Time   NA 137 11/28/2014 0830   K 3.6 11/28/2014 0830   CL 99 11/28/2014 0830   CO2 30 11/28/2014 0830   BUN 15 11/28/2014 0830   CREATININE 0.67 11/28/2014 0830      Component Value Date/Time    CALCIUM 8.4 11/28/2014 0830   ALKPHOS 73 09/27/2013 1541   AST 27 09/27/2013  1541   ALT 16 09/27/2013 1541   BILITOT 0.6 09/27/2013 1541     Lab Results  Component Value Date   WBC 4.7 11/26/2014   HGB 10.0* 11/26/2014   HCT 29.9* 11/26/2014   MCV 94.6 11/26/2014   PLT 340 11/26/2014   No results found for: CHOL    Assessment / Plan: 1. Acute on chronic Systolic HF - EF of 40 to 45% per echo and 48% per Myoview - no ischemia on Myoview . Reinforced need to take lasix.   Continue sodium restriction. Work on less processed foods.  I will follow up in 6 months. Labs are followed by Dr. Joylene Draft.   2. Chronic atrial fib - not a candidate for anticoagulation. Rate controlled.   3. CHB - s/p PPM - followed by Dr. Rayann Heman - now in VVI mode. Pacer check OK.  4. CAD - managed medically   5. RA  6. Hepatic cirrhosis.

## 2015-04-21 ENCOUNTER — Encounter: Payer: Self-pay | Admitting: *Deleted

## 2015-05-16 ENCOUNTER — Encounter: Payer: Self-pay | Admitting: *Deleted

## 2015-05-24 ENCOUNTER — Encounter: Payer: Self-pay | Admitting: Gastroenterology

## 2015-05-24 ENCOUNTER — Ambulatory Visit (INDEPENDENT_AMBULATORY_CARE_PROVIDER_SITE_OTHER): Payer: Medicare Other | Admitting: Gastroenterology

## 2015-05-24 VITALS — BP 138/70 | HR 72 | Ht 67.32 in | Wt 158.1 lb

## 2015-05-24 DIAGNOSIS — I2510 Atherosclerotic heart disease of native coronary artery without angina pectoris: Secondary | ICD-10-CM | POA: Diagnosis not present

## 2015-05-24 DIAGNOSIS — J312 Chronic pharyngitis: Secondary | ICD-10-CM

## 2015-05-24 DIAGNOSIS — R1310 Dysphagia, unspecified: Secondary | ICD-10-CM

## 2015-05-24 DIAGNOSIS — R1314 Dysphagia, pharyngoesophageal phase: Secondary | ICD-10-CM

## 2015-05-24 DIAGNOSIS — R1319 Other dysphagia: Secondary | ICD-10-CM

## 2015-05-24 DIAGNOSIS — M542 Cervicalgia: Secondary | ICD-10-CM | POA: Diagnosis not present

## 2015-05-24 NOTE — Patient Instructions (Signed)
Please contact Dr. Silvestre Mesi office regarding your referral to a [facility] [place] physician. Also discuss stopping your potassium.   Thank you for choosing me and [facility].  Pricilla Riffle. Dagoberto Ligas., MD., Marval Regal  cc: Crist Infante, MD

## 2015-05-24 NOTE — Progress Notes (Signed)
    History of Present Illness: This is an 81 year old male with multiple comorbidities complaining of worsening right-sided neck pain and difficulty swallowing his potassium pills. He has a history of cirrhosis, ascites, portal gastropathy with no esophageal or gastric varices. He has a history of COPD, severe rheumatoid arthritis, myasthenia gravis, macrocytic anemia, hypertension, complete heart block, atrial fibrillation, coronary artery disease, congestive heart failure, asthma, GERD, alcohol abuse, skin cancer of the forehead and left ear, lung nodule, nephrolithiasis, and herpes zoster. He has had bilateral knee replacements, hip replacement, splenectomy, appendectomy, pacemaker insertion, cardiac catheterization, and inguinal hernia repair. He underwent EGD with dilation in December 2015 for a mid esophageal stricture. Findings were concerning for pill induced ulceration and he was advised to temporarily hold his potassium pills. Still notes difficulty swallowing his Klor-Con but has no problems with other pills or foods. His main concern is progressively worsening right-sided neck pain that is constant in nature localized to his mid right neck and he states the pain is radiating to his jaw and right ear. Symptoms have been present for several months and have worsened over the past 2 months.  Current Medications, Allergies, Past Medical History, Past Surgical History, Family History and Social History were reviewed in [employer] record.  Physical Exam: General: Well developed , well nourished, elderly, chronically ill-appearing, no acute distress Head: Normocephalic and atraumatic Eyes:  sclerae anicteric, EOMI Ears: Normal auditory acuity Mouth: No deformity or lesions. Mild pharyngeal erythema no lesions noted Neck: Bilateral adenopathy with tenderness over his mid right neck Lungs: Clear throughout to auscultation Heart: Regular rate and rhythm; no murmurs, rubs or  bruits Abdomen: Soft, non tender and non distended. No masses, hepatosplenomegaly or hernias noted. Normal Bowel sounds Musculoskeletal: Symmetrical with no gross deformities  Pulses:  Normal pulses noted Extremities: No clubbing, cyanosis or deformities noted. Bilateral lower extremity edema. Neurological: Alert oriented x 4, grossly nonfocal Psychological:  Alert and cooperative. Normal mood and affect  Assessment and Recommendations:  1. Dysphagia with Klor-Con. History of esophageal stricture with dilation in 09/2014 which has relieved the majority of his symptoms. Continue Nexium 40 mg twice daily for GERD. He is advised to contact Dr. Joylene Draft to discontinue Klor-Con and use an alternative, liquid or much smaller pill.  2. Progressively worsening right neck pain now radiating to his right ear so sedated with mid right neck tenderness and adenopathy. ENT referral for further evaluation.

## 2015-06-12 ENCOUNTER — Emergency Department (HOSPITAL_COMMUNITY): Payer: Medicare Other

## 2015-06-12 ENCOUNTER — Inpatient Hospital Stay (HOSPITAL_COMMUNITY)
Admission: EM | Admit: 2015-06-12 | Discharge: 2015-06-15 | DRG: 194 | Disposition: A | Discharge status: Home / Self Care | Payer: Medicare Other | Attending: Internal Medicine | Admitting: Internal Medicine

## 2015-06-12 ENCOUNTER — Encounter (HOSPITAL_COMMUNITY): Payer: Self-pay | Admitting: Vascular Surgery

## 2015-06-12 DIAGNOSIS — R911 Solitary pulmonary nodule: Secondary | ICD-10-CM | POA: Diagnosis present

## 2015-06-12 DIAGNOSIS — J449 Chronic obstructive pulmonary disease, unspecified: Secondary | ICD-10-CM | POA: Diagnosis present

## 2015-06-12 DIAGNOSIS — G7000 Myasthenia gravis without (acute) exacerbation: Secondary | ICD-10-CM | POA: Diagnosis present

## 2015-06-12 DIAGNOSIS — R609 Edema, unspecified: Secondary | ICD-10-CM

## 2015-06-12 DIAGNOSIS — R1310 Dysphagia, unspecified: Secondary | ICD-10-CM | POA: Diagnosis not present

## 2015-06-12 DIAGNOSIS — R55 Syncope and collapse: Secondary | ICD-10-CM | POA: Diagnosis not present

## 2015-06-12 DIAGNOSIS — I482 Chronic atrial fibrillation: Secondary | ICD-10-CM | POA: Diagnosis not present

## 2015-06-12 DIAGNOSIS — Z85828 Personal history of other malignant neoplasm of skin: Secondary | ICD-10-CM | POA: Diagnosis not present

## 2015-06-12 DIAGNOSIS — N400 Enlarged prostate without lower urinary tract symptoms: Secondary | ICD-10-CM | POA: Diagnosis present

## 2015-06-12 DIAGNOSIS — F1010 Alcohol abuse, uncomplicated: Secondary | ICD-10-CM | POA: Diagnosis present

## 2015-06-12 DIAGNOSIS — M069 Rheumatoid arthritis, unspecified: Secondary | ICD-10-CM | POA: Diagnosis present

## 2015-06-12 DIAGNOSIS — J189 Pneumonia, unspecified organism: Principal | ICD-10-CM | POA: Diagnosis present

## 2015-06-12 DIAGNOSIS — E785 Hyperlipidemia, unspecified: Secondary | ICD-10-CM | POA: Diagnosis present

## 2015-06-12 DIAGNOSIS — R0600 Dyspnea, unspecified: Secondary | ICD-10-CM | POA: Diagnosis present

## 2015-06-12 DIAGNOSIS — K219 Gastro-esophageal reflux disease without esophagitis: Secondary | ICD-10-CM | POA: Diagnosis present

## 2015-06-12 DIAGNOSIS — I442 Atrioventricular block, complete: Secondary | ICD-10-CM | POA: Diagnosis present

## 2015-06-12 DIAGNOSIS — Z96653 Presence of artificial knee joint, bilateral: Secondary | ICD-10-CM | POA: Diagnosis present

## 2015-06-12 DIAGNOSIS — K7460 Unspecified cirrhosis of liver: Secondary | ICD-10-CM | POA: Diagnosis present

## 2015-06-12 DIAGNOSIS — Z79891 Long term (current) use of opiate analgesic: Secondary | ICD-10-CM

## 2015-06-12 DIAGNOSIS — Z888 Allergy status to other drugs, medicaments and biological substances status: Secondary | ICD-10-CM

## 2015-06-12 DIAGNOSIS — Z66 Do not resuscitate: Secondary | ICD-10-CM | POA: Diagnosis present

## 2015-06-12 DIAGNOSIS — F329 Major depressive disorder, single episode, unspecified: Secondary | ICD-10-CM | POA: Diagnosis present

## 2015-06-12 DIAGNOSIS — Z96649 Presence of unspecified artificial hip joint: Secondary | ICD-10-CM | POA: Diagnosis present

## 2015-06-12 DIAGNOSIS — F423 Hoarding disorder: Secondary | ICD-10-CM | POA: Diagnosis present

## 2015-06-12 DIAGNOSIS — Z7982 Long term (current) use of aspirin: Secondary | ICD-10-CM

## 2015-06-12 DIAGNOSIS — I5042 Chronic combined systolic (congestive) and diastolic (congestive) heart failure: Secondary | ICD-10-CM | POA: Diagnosis present

## 2015-06-12 DIAGNOSIS — Z950 Presence of cardiac pacemaker: Secondary | ICD-10-CM

## 2015-06-12 DIAGNOSIS — R079 Chest pain, unspecified: Secondary | ICD-10-CM | POA: Diagnosis not present

## 2015-06-12 DIAGNOSIS — Z87891 Personal history of nicotine dependence: Secondary | ICD-10-CM | POA: Diagnosis not present

## 2015-06-12 DIAGNOSIS — F411 Generalized anxiety disorder: Secondary | ICD-10-CM | POA: Diagnosis present

## 2015-06-12 DIAGNOSIS — Z79899 Other long term (current) drug therapy: Secondary | ICD-10-CM | POA: Diagnosis not present

## 2015-06-12 DIAGNOSIS — I2510 Atherosclerotic heart disease of native coronary artery without angina pectoris: Secondary | ICD-10-CM | POA: Diagnosis present

## 2015-06-12 DIAGNOSIS — I10 Essential (primary) hypertension: Secondary | ICD-10-CM | POA: Diagnosis present

## 2015-06-12 DIAGNOSIS — J42 Unspecified chronic bronchitis: Secondary | ICD-10-CM

## 2015-06-12 DIAGNOSIS — F32A Depression, unspecified: Secondary | ICD-10-CM | POA: Diagnosis present

## 2015-06-12 DIAGNOSIS — E43 Unspecified severe protein-calorie malnutrition: Secondary | ICD-10-CM | POA: Insufficient documentation

## 2015-06-12 LAB — BASIC METABOLIC PANEL
ANION GAP: 8 (ref 5–15)
BUN: 16 mg/dL (ref 6–20)
CALCIUM: 8.7 mg/dL — AB (ref 8.9–10.3)
CO2: 29 mmol/L (ref 22–32)
CREATININE: 0.61 mg/dL (ref 0.61–1.24)
Chloride: 99 mmol/L — ABNORMAL LOW (ref 101–111)
Glucose, Bld: 158 mg/dL — ABNORMAL HIGH (ref 65–99)
Potassium: 4.2 mmol/L (ref 3.5–5.1)
SODIUM: 136 mmol/L (ref 135–145)

## 2015-06-12 LAB — I-STAT TROPONIN, ED: TROPONIN I, POC: 0.01 ng/mL (ref 0.00–0.08)

## 2015-06-12 LAB — CBC
HCT: 34.4 % — ABNORMAL LOW (ref 39.0–52.0)
Hemoglobin: 11.4 g/dL — ABNORMAL LOW (ref 13.0–17.0)
MCH: 31.8 pg (ref 26.0–34.0)
MCHC: 33.1 g/dL (ref 30.0–36.0)
MCV: 95.8 fL (ref 78.0–100.0)
PLATELETS: 355 10*3/uL (ref 150–400)
RBC: 3.59 MIL/uL — AB (ref 4.22–5.81)
RDW: 18.6 % — ABNORMAL HIGH (ref 11.5–15.5)
WBC: 8 10*3/uL (ref 4.0–10.5)

## 2015-06-12 MED ORDER — ALBUTEROL SULFATE (2.5 MG/3ML) 0.083% IN NEBU: 2.5000 mg | INHALATION_SOLUTION | RESPIRATORY_TRACT | Status: DC | PRN

## 2015-06-12 MED ORDER — PIPERACILLIN-TAZOBACTAM 3.375 G IVPB 30 MIN
3.3750 g | Freq: Once | INTRAVENOUS | Status: AC
  Administered 2015-06-13: 3.375 g via INTRAVENOUS
  Filled 2015-06-12: qty 50

## 2015-06-12 MED ORDER — DM-GUAIFENESIN ER 30-600 MG PO TB12
1.0000 | ORAL_TABLET | Freq: Two times a day (BID) | ORAL | Status: DC
  Administered 2015-06-13 – 2015-06-15 (×5): 1 via ORAL
  Filled 2015-06-12 (×7): qty 1

## 2015-06-12 MED ORDER — SPIRONOLACTONE 25 MG PO TABS
25.0000 mg | ORAL_TABLET | Freq: Every day | ORAL | Status: DC
  Administered 2015-06-13 – 2015-06-15 (×3): 25 mg via ORAL
  Filled 2015-06-12 (×3): qty 1

## 2015-06-12 MED ORDER — VANCOMYCIN HCL IN DEXTROSE 750-5 MG/150ML-% IV SOLN
750.0000 mg | Freq: Two times a day (BID) | INTRAVENOUS | Status: DC
  Administered 2015-06-13 – 2015-06-15 (×4): 750 mg via INTRAVENOUS
  Filled 2015-06-12 (×7): qty 150

## 2015-06-12 MED ORDER — PIPERACILLIN-TAZOBACTAM 3.375 G IVPB: 3.3750 g | Freq: Three times a day (TID) | INTRAVENOUS | Status: DC

## 2015-06-12 MED ORDER — LIDOCAINE VISCOUS 2 % MT SOLN
20.0000 mL | OROMUCOSAL | Status: DC | PRN
  Administered 2015-06-14: 20 mL via OROMUCOSAL
  Filled 2015-06-12 (×3): qty 20

## 2015-06-12 MED ORDER — FINASTERIDE 5 MG PO TABS
5.0000 mg | ORAL_TABLET | Freq: Every day | ORAL | Status: DC
  Administered 2015-06-13 – 2015-06-15 (×3): 5 mg via ORAL
  Filled 2015-06-12 (×3): qty 1

## 2015-06-12 MED ORDER — PANTOPRAZOLE SODIUM 40 MG PO TBEC
40.0000 mg | DELAYED_RELEASE_TABLET | Freq: Every day | ORAL | Status: DC
  Administered 2015-06-13 – 2015-06-15 (×3): 40 mg via ORAL
  Filled 2015-06-12 (×3): qty 1

## 2015-06-12 MED ORDER — TRIAMCINOLONE ACETONIDE 0.1 % EX CREA
1.0000 "application " | TOPICAL_CREAM | Freq: Every day | CUTANEOUS | Status: DC
  Administered 2015-06-13 – 2015-06-15 (×3): 1 via TOPICAL
  Filled 2015-06-12: qty 15

## 2015-06-12 MED ORDER — OMEGA-3-ACID ETHYL ESTERS 1 G PO CAPS
1.0000 g | ORAL_CAPSULE | Freq: Every day | ORAL | Status: DC
  Administered 2015-06-13 – 2015-06-15 (×3): 1 g via ORAL
  Filled 2015-06-12 (×3): qty 1

## 2015-06-12 MED ORDER — IPRATROPIUM-ALBUTEROL 0.5-2.5 (3) MG/3ML IN SOLN
3.0000 mL | RESPIRATORY_TRACT | Status: DC
  Administered 2015-06-12 – 2015-06-13 (×2): 3 mL via RESPIRATORY_TRACT
  Filled 2015-06-12 (×2): qty 3

## 2015-06-12 MED ORDER — VANCOMYCIN HCL IN DEXTROSE 1-5 GM/200ML-% IV SOLN
1000.0000 mg | Freq: Once | INTRAVENOUS | Status: AC
  Administered 2015-06-13: 1000 mg via INTRAVENOUS
  Filled 2015-06-12: qty 200

## 2015-06-12 MED ORDER — LORAZEPAM 1 MG PO TABS: 0.0000 mg | ORAL_TABLET | Freq: Two times a day (BID) | ORAL | Status: DC

## 2015-06-12 MED ORDER — VITAMIN B-1 100 MG PO TABS
100.0000 mg | ORAL_TABLET | Freq: Every day | ORAL | Status: DC
  Administered 2015-06-13 – 2015-06-15 (×3): 100 mg via ORAL
  Filled 2015-06-12 (×4): qty 1

## 2015-06-12 MED ORDER — TEMAZEPAM 15 MG PO CAPS
15.0000 mg | ORAL_CAPSULE | Freq: Every day | ORAL | Status: DC
  Administered 2015-06-13 – 2015-06-14 (×2): 15 mg via ORAL
  Filled 2015-06-12 (×3): qty 1

## 2015-06-12 MED ORDER — ASPIRIN EC 81 MG PO TBEC
81.0000 mg | DELAYED_RELEASE_TABLET | Freq: Every day | ORAL | Status: DC
  Administered 2015-06-13 – 2015-06-15 (×3): 81 mg via ORAL
  Filled 2015-06-12 (×3): qty 1

## 2015-06-12 MED ORDER — OXYCODONE-ACETAMINOPHEN 5-325 MG PO TABS
1.0000 | ORAL_TABLET | Freq: Four times a day (QID) | ORAL | Status: DC | PRN
  Administered 2015-06-13 – 2015-06-14 (×4): 2 via ORAL
  Filled 2015-06-12 (×4): qty 2

## 2015-06-12 MED ORDER — ESCITALOPRAM OXALATE 20 MG PO TABS
20.0000 mg | ORAL_TABLET | Freq: Every day | ORAL | Status: DC
  Administered 2015-06-13 – 2015-06-15 (×3): 20 mg via ORAL
  Filled 2015-06-12: qty 1
  Filled 2015-06-12: qty 2
  Filled 2015-06-12: qty 1

## 2015-06-12 MED ORDER — DEXTROSE 5 % IV SOLN
1.0000 g | Freq: Once | INTRAVENOUS | Status: AC
  Administered 2015-06-12: 1 g via INTRAVENOUS
  Filled 2015-06-12: qty 10

## 2015-06-12 MED ORDER — FUROSEMIDE 40 MG PO TABS
40.0000 mg | ORAL_TABLET | Freq: Every day | ORAL | Status: DC
  Administered 2015-06-13 – 2015-06-15 (×3): 40 mg via ORAL
  Filled 2015-06-12: qty 1
  Filled 2015-06-12: qty 2
  Filled 2015-06-12: qty 1

## 2015-06-12 MED ORDER — PIPERACILLIN-TAZOBACTAM 3.375 G IVPB
3.3750 g | Freq: Three times a day (TID) | INTRAVENOUS | Status: DC
  Administered 2015-06-13 – 2015-06-15 (×7): 3.375 g via INTRAVENOUS
  Filled 2015-06-12 (×11): qty 50

## 2015-06-12 MED ORDER — LORAZEPAM 1 MG PO TABS
0.0000 mg | ORAL_TABLET | Freq: Four times a day (QID) | ORAL | Status: AC
  Administered 2015-06-14 (×2): 1 mg via ORAL
  Filled 2015-06-12 (×2): qty 1
  Filled 2015-06-12: qty 4

## 2015-06-12 MED ORDER — THIAMINE HCL 100 MG/ML IJ SOLN
100.0000 mg | Freq: Every day | INTRAMUSCULAR | Status: DC
  Administered 2015-06-13: 100 mg via INTRAVENOUS
  Filled 2015-06-12: qty 1
  Filled 2015-06-12: qty 2

## 2015-06-12 MED ORDER — ENOXAPARIN SODIUM 40 MG/0.4ML ~~LOC~~ SOLN
40.0000 mg | Freq: Every day | SUBCUTANEOUS | Status: DC
  Administered 2015-06-13 – 2015-06-15 (×3): 40 mg via SUBCUTANEOUS
  Filled 2015-06-12 (×3): qty 0.4

## 2015-06-12 MED ORDER — SILVER SULFADIAZINE 1 % EX CREA
1.0000 "application " | TOPICAL_CREAM | Freq: Every day | CUTANEOUS | Status: DC
  Administered 2015-06-14 – 2015-06-15 (×2): 1 via TOPICAL
  Filled 2015-06-12: qty 85

## 2015-06-12 MED ORDER — BUDESONIDE-FORMOTEROL FUMARATE 160-4.5 MCG/ACT IN AERO
2.0000 | INHALATION_SPRAY | Freq: Two times a day (BID) | RESPIRATORY_TRACT | Status: DC
  Administered 2015-06-13 – 2015-06-15 (×3): 2 via RESPIRATORY_TRACT
  Filled 2015-06-12 (×2): qty 6

## 2015-06-12 MED ORDER — DEXTROSE 5 % IV SOLN
500.0000 mg | Freq: Once | INTRAVENOUS | Status: DC
  Administered 2015-06-12: 500 mg via INTRAVENOUS
  Filled 2015-06-12: qty 500

## 2015-06-12 MED ORDER — DOCUSATE SODIUM 100 MG PO CAPS: 100.0000 mg | ORAL_CAPSULE | Freq: Two times a day (BID) | ORAL | Status: DC | PRN

## 2015-06-12 MED ORDER — AZATHIOPRINE 50 MG PO TABS
50.0000 mg | ORAL_TABLET | Freq: Three times a day (TID) | ORAL | Status: DC
  Administered 2015-06-13 – 2015-06-15 (×7): 50 mg via ORAL
  Filled 2015-06-12 (×11): qty 1

## 2015-06-12 NOTE — Progress Notes (Signed)
Assessed pt, pt is in a flat mood, pt states that he has quit smoking 7years ago and has history of COPD. Ask pt has he been coughing up anything, he stated "No". I left a sputum cup at the bedside and ask him if he needed to spit to spit in the cup. RN aware of sputum cup at bedside table. Pt is stable at this time. Neb given no complications noted .

## 2015-06-12 NOTE — ED Notes (Signed)
Pt c/o pain in right side of neck, when ask if he wanted something for pain pt st's in one hour.

## 2015-06-12 NOTE — ED Provider Notes (Signed)
CSN: [id number]     Arrival date & time 06/12/15  1509 History   First MD Initiated Contact with Patient 06/12/15 2012     Chief Complaint  Patient presents with  . Cough     (Consider location/radiation/quality/duration/timing/severity/associated sxs/prior Treatment) HPI Comments: Patient with a history of Myasthenia Gravis, COPD, Complete Heart Block s/p pacemaker presents today with a chief complaint of productive cough for the past month.  Cough has been worsening over the past couple of days.  He reports occasional SOB.  He also reports occasional chest pain, but no pain at this time.  He has not taken anything for his cough prior to arrival.  His daughter reports that he has been having difficulty swallowing recently and has been choking on his food.  He denies fever, chills, abdominal pain, dizziness, or syncope.  His daughter also reports that the patient has been drinking a large amount of alcohol recently.  He currently lives at home by himself.  The history is provided by the patient.    Past Medical History  Diagnosis Date  . Complete heart block     s/p PPM  . PAF (paroxysmal atrial fibrillation)   . HTN (hypertension)   . HLD (hyperlipidemia)   . CAD (coronary artery disease)   . Depression   . Anxiety   . Hemorrhoids, external   . GERD (gastroesophageal reflux disease)   . Hyperplastic colonic polyp   . Diverticulosis of colon   . COPD (chronic obstructive pulmonary disease)   . Myasthenia gravis   . Arthritis, rheumatoid   . BPH (benign prostatic hypertrophy)   . Cirrhosis of liver   . Asthma   . Alcohol abuse   . Hiatal hernia   . Skin cancer of forehead     and left ear  . Lung nodule   . Nephrolithiasis   . CHF (congestive heart failure)   . Pacemaker   . Herpes zoster   . Hoarding disorder with poor insight   . Esophageal stricture    Past Surgical History  Procedure Laterality Date  . Total knee arthroplasty      bilateral  . Total hip  arthroplasty      x3  . Splenectomy    . Appendectomy    . Pacemaker insertion  2009    medtronic  . Cardiac catheterization  10/12/2007    EF 60%  . Inguinal hernia repair    . Esophagogastroduodenoscopy N/A 09/23/2014    Procedure: ESOPHAGOGASTRODUODENOSCOPY (EGD);  Surgeon: Ladene Artist, MD;  Location: [facility];  Service: Endoscopy;  Laterality: N/A;  . Savory dilation N/A 09/23/2014    Procedure: SAVORY DILATION;  Surgeon: Ladene Artist, MD;  Location: [facility];  Service: Endoscopy;  Laterality: N/A;  . Skin cancer excision     Family History  Problem Relation Age of Onset  . Myasthenia gravis Mother   . Esophageal cancer Father   . HIV Son     Aids  . Diabetes Son   . Heart disease Maternal Uncle   . Colon cancer Other     Cousin on Mother's side  . Colon polyps Neg Hx    Social History  Substance Use Topics  . Smoking status: Former Smoker -- 40 years    Types: Cigars    Quit date: 10/22/1987  . Smokeless tobacco: Never Used  . Alcohol Use: 8.4 oz/week    14 Cans of beer per week    Review of Systems  All  other systems reviewed and are negative.     Allergies  Celebrex; Alendronate sodium; Benazepril; and Remicade  Home Medications   Prior to Admission medications   Medication Sig Start Date End Date Taking? Authorizing Provider  aspirin EC 81 MG tablet Take 81 mg by mouth daily.    Historical Provider, MD  azaTHIOprine (IMURAN) 50 MG tablet Take 50 mg by mouth 3 (three) times daily.  11/07/13   Barton Dubois, MD  budesonide-formoterol [facility]) 160-4.5 MCG/ACT inhaler Inhale 2 puffs into the lungs 2 (two) times daily.    Historical Provider, MD  docusate sodium (COLACE) 100 MG capsule Take 100 mg by mouth 2 (two) times daily as needed for constipation.    Historical Provider, MD  escitalopram (LEXAPRO) 20 MG tablet Take 20 mg by mouth daily.  09/16/14   Historical Provider, MD  esomeprazole (NEXIUM) 40 MG capsule Take 40 mg by mouth 2 (two)  times daily before a meal.     Historical Provider, MD  FIBER PO Take 1 tablet by mouth 2 (two) times daily with a meal.     Historical Provider, MD  finasteride (PROSCAR) 5 MG tablet Take 5 mg by mouth daily.  11/17/13   Historical Provider, MD  furosemide (LASIX) 40 MG tablet Take 1.5 tablets (60 mg total) by mouth daily. Patient taking differently: Take 40 mg by mouth daily.  11/07/13   Barton Dubois, MD  KLOR-CON M20 20 MEQ tablet Take 1 tablet by mouth daily. 02/22/15   Historical Provider, MD  lidocaine (XYLOCAINE) 2 % solution Use as directed 20 mLs in the mouth or throat every 4 (four) hours as needed (swallowing difficulties for 5 days). 09/23/14   Ladene Artist, MD  Omega-3 Fatty Acids (FISH OIL) 1000 MG CAPS Take 1 capsule by mouth daily.    Historical Provider, MD  oxyCODONE-acetaminophen (PERCOCET/ROXICET) 5-325 MG per tablet Take 1-2 tablets by mouth every 6 (six) hours as needed for moderate pain.     Historical Provider, MD  silver sulfADIAZINE (SILVADENE) 1 % cream Apply 1 application topically daily.  12/03/13   Bronson Ing, DPM  spironolactone (ALDACTONE) 50 MG tablet Take 25 mg by mouth daily.  01/30/14   Historical Provider, MD  temazepam (RESTORIL) 15 MG capsule Take 15 mg by mouth at bedtime. For sleep    Historical Provider, MD  triamcinolone cream (KENALOG) 0.1 % Apply 1 application topically daily.  03/19/12   Historical Provider, MD   BP 133/61 mmHg  Pulse 66  Temp(Src) 98.2 F (36.8 C) (Oral)  Resp 16  SpO2 97% Physical Exam  Constitutional: He appears well-developed and well-nourished.  HENT:  Head: Normocephalic and atraumatic.  Mouth/Throat: Oropharynx is clear and moist.  Neck: Normal range of motion. Neck supple.  Cardiovascular: Normal rate, regular rhythm and normal heart sounds.   Pulmonary/Chest: Effort normal. No respiratory distress. He has rales.  Musculoskeletal: Normal range of motion.  Neurological: He is alert.  Skin: Skin is warm and dry.   Chronic venous stasis changes of both lower extremities.  Psychiatric: He has a normal mood and affect.  Nursing note and vitals reviewed.   ED Course  Procedures (including critical care time) Labs Review Labs Reviewed  BASIC METABOLIC PANEL - Abnormal; Notable for the following:    Chloride 99 (*)    Glucose, Bld 158 (*)    Calcium 8.7 (*)    All other components within normal limits  CBC - Abnormal; Notable for the following:  RBC 3.59 (*)    Hemoglobin 11.4 (*)    HCT 34.4 (*)    RDW 18.6 (*)    All other components within normal limits  Randolm Idol, ED    Imaging Review Dg Chest 2 View  06/12/2015   CLINICAL DATA:  Productive cough 1 month.  EXAM: CHEST  2 VIEW  COMPARISON:  November 25, 2014.  FINDINGS: Stable cardiomediastinal silhouette. Left-sided pacemaker is unchanged in position. No pneumothorax is noted. Anterior osteophyte formation is noted in lower thoracic spine. Left lung is clear. Right basilar opacity is noted which is slightly decreased compared to prior exam, most consistent with pneumonia or atelectasis with associated pleural effusion. Stable calcified granuloma is noted in right midlung.  IMPRESSION: Mild right basilar opacity is noted concerning for pneumonia or atelectasis with associated pleural effusion.   Electronically Signed   By: Marijo Conception, M.D.   On: 06/12/2015 16:23   I have personally reviewed and evaluated these images and lab results as part of my medical decision-making.   EKG Interpretation   Date/Time:  Monday June 12 2015 15:37:57 EDT Ventricular Rate:  65 PR Interval:    QRS Duration: 154 QT Interval:  498 QTC Calculation: 517 R Axis:   -60 Text Interpretation:  Ventricular-paced rhythm Abnormal ECG No significant  change since last tracing Confirmed by LIU MD, Hinton Dyer ([id number]) on 06/12/2015  3:43:16 PM      MDM   Final diagnoses:  None   Patient with a history of COPD presents today with complaints of productive  cough x 1 month, worsening over the past couple of days.  No signs of respiratory distress.  CXR showing right basilar opacity concerning for Pneumonia.  Patient denies any hospitalizations in the past 90 days.  He currently lives at home by himself.  Therefore, patient treated for CAP with IV antibiotics.  Due to several comorbidities will admit for further management of CAP.  Patient admitted to [place], PA-C 06/14/15 2111  Fredia Sorrow, MD 06/28/15 [phone]

## 2015-06-12 NOTE — Progress Notes (Addendum)
ANTIBIOTIC CONSULT NOTE - INITIAL  Pharmacy Consult for Vancomycin and renally adjust other abx Indication: rule out pneumonia  Allergies  Allergen Reactions  . Celebrex [Celecoxib] Nausea And Vomiting and Other (See Comments)    Stomach ulcers and bleeding  . Alendronate Sodium Other (See Comments)    Unknown.  . Benazepril Other (See Comments)    Unknown.  . Remicade [Infliximab] Other (See Comments)    Patient Measurements:   Adjusted Body Weight:   Vital Signs: Temp: 98.2 F (36.8 C) (08/22 1532) Temp Source: Oral (08/22 1532) BP: 139/66 mmHg (08/22 2145) Pulse Rate: 61 (08/22 2145) Intake/Output from previous day:   Intake/Output from this shift:    Labs:  Recent Labs  06/12/15 1553  WBC 8.0  HGB 11.4*  PLT 355  CREATININE 0.61   CrCl cannot be calculated (Unknown ideal weight.). No results for input(s): VANCOTROUGH, VANCOPEAK, VANCORANDOM, GENTTROUGH, GENTPEAK, GENTRANDOM, TOBRATROUGH, TOBRAPEAK, TOBRARND, AMIKACINPEAK, AMIKACINTROU, AMIKACIN in the last 72 hours.   Microbiology: No results found for this or any previous visit (from the past 720 hour(s)).  Medical History: Past Medical History  Diagnosis Date  . Complete heart block     s/p PPM  . PAF (paroxysmal atrial fibrillation)   . HTN (hypertension)   . HLD (hyperlipidemia)   . CAD (coronary artery disease)   . Depression   . Anxiety   . Hemorrhoids, external   . GERD (gastroesophageal reflux disease)   . Hyperplastic colonic polyp   . Diverticulosis of colon   . COPD (chronic obstructive pulmonary disease)   . Myasthenia gravis   . Arthritis, rheumatoid   . BPH (benign prostatic hypertrophy)   . Cirrhosis of liver   . Asthma   . Alcohol abuse   . Hiatal hernia   . Skin cancer of forehead     and left ear  . Lung nodule   . Nephrolithiasis   . CHF (congestive heart failure)   . Pacemaker   . Herpes zoster   . Hoarding disorder with poor insight   . Esophageal stricture      Medications:   (Not in a hospital admission) Scheduled:  . [START ON 06/13/2015] LORazepam  0-4 mg Oral 4 times per day   Followed by  . [START ON 06/15/2015] LORazepam  0-4 mg Oral Q12H  . thiamine  100 mg Oral Daily   Or  . thiamine  100 mg Intravenous Daily   Infusions:  . azithromycin    . cefTRIAXone (ROCEPHIN)  IV 1 g (06/12/15 2150)  . vancomycin     Assessment: 81yo male with history of splenectomy, MG on imuran, COPD, CHF and GERD presents with productive cough and SOB. Pharmacy is consulted to dose vancomycin for suspected pneumonia. Pt is afebrile, WBC wnl, sCr 0.6  Pt received Ceftriaxone and Azithromycin x1 in ED.  Goal of Therapy:  Vancomycin trough level 15-20 mcg/ml  Plan:  Vancomycin 1g IV once followed by 750mg  q12h Zosyn 3.375g IV q8h Expected duration 7 days with resolution of temperature and/or normalization of WBC Measure antibiotic drug levels at steady state Follow up culture results, renal function and clinical course  Andrey Cota. Diona Foley, PharmD Clinical Pharmacist Pager [phone] 06/12/2015,9:56 PM

## 2015-06-12 NOTE — ED Notes (Signed)
Pt reports to the ED for eval of productive yellow cough x 1 month. Pt has hx of COPD. He also reports he had some blood in his sputum. Pt attempted to get in his with his PCP and was told come here. Denies any fevers or chills. Pt reports generalized pain r/t chronic illness. Reports CP with severe coughing fits. Pt A&Ox4, resp e/u, and skin warm and dry.

## 2015-06-12 NOTE — H&P (Addendum)
[facility] History and Physical  [name] [name] UEK:[id number] DOB: 06-10-1929 DOA: 06/12/2015  Referring physician: ED physician PCP: [name], MD  Specialists:   Chief Complaint: Productive cough, shortness of breath  HPI: [name] [name] is a 81 y.o. male with PMH of s/p of splenectomy, MG on Imuran, combined systolic and diastolic congestive heart failure (EF of 40-45% with grade 3 diastolic dysfunction), COPD, GERD, depression, anxiety, complete heart block, pacemaker placement, BPH, arthritis, alcohol abuse, liver cirrhosis, skin cancer, history of esophageal stricture, who presents with productive cough and shortness of breath.  Patient reports that in the past one mouth, he has been having productive cough and mild shortness of breath, which has been worsening in the past several days. He also reports he had seen streaks of blood in his sputum sometimes. He does not have fever, chills, runny nose, sore throat. He had mild chest pain several days ago, which has resolved completely, no any chest pain currently. He has chronic rashes over right arm and back for 2 years, which has not changed. Patient has mild leg edema due to CHF. No abdominal pain, diarrhea, symptoms of UTI or unilateral weakness. Per his daughter, patient chocks when he eats solid food sometimes.  In ED, patient was found to have WBC 8.0, negative troponin, temperature normal, no tachycardia, electrolytes okay. Chest x-ray showed a right basilar infiltration.   Where does patient live?   At home  Can patient participate in ADLs?  None  Review of Systems:   General: no fevers, chills, no changes in body weight, no fatigue HEENT: no blurry vision, hearing changes or sore throat Pulm: has dyspnea, coughing, no wheezing CV: had chest pain, no palpitations Abd: no nausea, vomiting, abdominal pain, diarrhea, constipation GU: no dysuria, burning on urination, increased urinary frequency, hematuria  Ext: has leg  edema Neuro: no unilateral weakness, numbness, or tingling, no vision change or hearing loss Skin: has skin rash over right arm and back. MSK: No muscle spasm, no deformity, no limitation of range of movement in spin Heme: No easy bruising.  Travel history: No recent long distant travel.  Allergy:  Allergies  Allergen Reactions  . Celebrex [Celecoxib] Nausea And Vomiting and Other (See Comments)    Stomach ulcers and bleeding  . Alendronate Sodium Other (See Comments)    Unknown.  . Benazepril Other (See Comments)    Unknown.  . Remicade [Infliximab] Other (See Comments)    Past Medical History  Diagnosis Date  . Complete heart block     s/p PPM  . PAF (paroxysmal atrial fibrillation)   . HTN (hypertension)   . HLD (hyperlipidemia)   . CAD (coronary artery disease)   . Depression   . Anxiety   . Hemorrhoids, external   . GERD (gastroesophageal reflux disease)   . Hyperplastic colonic polyp   . Diverticulosis of colon   . COPD (chronic obstructive pulmonary disease)   . Myasthenia gravis   . Arthritis, rheumatoid   . BPH (benign prostatic hypertrophy)   . Cirrhosis of liver   . Asthma   . Alcohol abuse   . Hiatal hernia   . Skin cancer of forehead     and left ear  . Lung nodule   . Nephrolithiasis   . CHF (congestive heart failure)   . Pacemaker   . Herpes zoster   . Hoarding disorder with poor insight   . Esophageal stricture     Past Surgical History  Procedure Laterality Date  .  Total knee arthroplasty      bilateral  . Total hip arthroplasty      x3  . Splenectomy    . Appendectomy    . Pacemaker insertion  2009    medtronic  . Cardiac catheterization  10/12/2007    EF 60%  . Inguinal hernia repair    . Esophagogastroduodenoscopy N/A 09/23/2014    Procedure: ESOPHAGOGASTRODUODENOSCOPY (EGD);  Surgeon: Ladene Artist, MD;  Location: [facility];  Service: Endoscopy;  Laterality: N/A;  . Savory dilation N/A 09/23/2014    Procedure: SAVORY  DILATION;  Surgeon: Ladene Artist, MD;  Location: [facility];  Service: Endoscopy;  Laterality: N/A;  . Skin cancer excision      Social History:  reports that he quit smoking about 27 years ago. His smoking use included Cigars. He has never used smokeless tobacco. He reports that he drinks about 8.4 oz of alcohol per week. He reports that he does not use illicit drugs.  Family History:  Family History  Problem Relation Age of Onset  . Myasthenia gravis Mother   . Esophageal cancer Father   . HIV Son     Aids  . Diabetes Son   . Heart disease Maternal Uncle   . Colon cancer Other     Cousin on Mother's side  . Colon polyps Neg Hx      Prior to Admission medications   Medication Sig Start Date End Date Taking? Authorizing Provider  aspirin EC 81 MG tablet Take 81 mg by mouth daily.    Historical Provider, MD  azaTHIOprine (IMURAN) 50 MG tablet Take 50 mg by mouth 3 (three) times daily.  11/07/13   Barton Dubois, MD  budesonide-formoterol [facility]) 160-4.5 MCG/ACT inhaler Inhale 2 puffs into the lungs 2 (two) times daily.    Historical Provider, MD  docusate sodium (COLACE) 100 MG capsule Take 100 mg by mouth 2 (two) times daily as needed for constipation.    Historical Provider, MD  escitalopram (LEXAPRO) 20 MG tablet Take 20 mg by mouth daily.  09/16/14   Historical Provider, MD  esomeprazole (NEXIUM) 40 MG capsule Take 40 mg by mouth 2 (two) times daily before a meal.     Historical Provider, MD  FIBER PO Take 1 tablet by mouth 2 (two) times daily with a meal.     Historical Provider, MD  finasteride (PROSCAR) 5 MG tablet Take 5 mg by mouth daily.  11/17/13   Historical Provider, MD  furosemide (LASIX) 40 MG tablet Take 1.5 tablets (60 mg total) by mouth daily. Patient taking differently: Take 40 mg by mouth daily.  11/07/13   Barton Dubois, MD  KLOR-CON M20 20 MEQ tablet Take 1 tablet by mouth daily. 02/22/15   Historical Provider, MD  lidocaine (XYLOCAINE) 2 % solution Use as  directed 20 mLs in the mouth or throat every 4 (four) hours as needed (swallowing difficulties for 5 days). 09/23/14   Ladene Artist, MD  Omega-3 Fatty Acids (FISH OIL) 1000 MG CAPS Take 1 capsule by mouth daily.    Historical Provider, MD  oxyCODONE-acetaminophen (PERCOCET/ROXICET) 5-325 MG per tablet Take 1-2 tablets by mouth every 6 (six) hours as needed for moderate pain.     Historical Provider, MD  silver sulfADIAZINE (SILVADENE) 1 % cream Apply 1 application topically daily.  12/03/13   Bronson Ing, DPM  spironolactone (ALDACTONE) 50 MG tablet Take 25 mg by mouth daily.  01/30/14   Historical Provider, MD  temazepam (RESTORIL)  15 MG capsule Take 15 mg by mouth at bedtime. For sleep    Historical Provider, MD  triamcinolone cream (KENALOG) 0.1 % Apply 1 application topically daily.  03/19/12   Historical Provider, MD    Physical Exam: Filed Vitals:   06/12/15 2100 06/12/15 2115 06/12/15 2130 06/12/15 2145  BP: 121/67 128/61 134/53 139/66  Pulse: 61 62 63 61  Temp:      TempSrc:      Resp: 13 14 15 14   SpO2: 93% 98% 97% 99%   General: Not in acute distress HEENT:       Eyes: PERRL, EOMI, no scleral icterus.       ENT: No discharge from the ears and nose, no pharynx injection, no tonsillar enlargement.        Neck: No JVD, no bruit, no mass felt. Heme: No neck lymph node enlargement. Cardiac: S1/S2, RRR, No murmurs, No gallops or rubs. Pulm: has rhonchi bilaterally. No rales, wheezing or rubs. Abd: Soft, nondistended, nontender, no rebound pain, no organomegaly, BS present. Ext: trace leg edema and venous insufficient change bilaterally. 2+DP/PT pulse bilaterally. Musculoskeletal: No joint deformities, No joint redness or warmth, no limitation of ROM in spin. Skin: has rashes over right arm and back, with itches Neuro: Alert, oriented X3, cranial nerves II-XII grossly intact, muscle strength 5/5 in all extremities, sensation to light touch intact. Psych: Patient is not  psychotic, no suicidal or hemocidal ideation.  Labs on Admission:  Basic Metabolic Panel:  Recent Labs Lab 06/12/15 1553  NA 136  K 4.2  CL 99*  CO2 29  GLUCOSE 158*  BUN 16  CREATININE 0.61  CALCIUM 8.7*   Liver Function Tests: No results for input(s): AST, ALT, ALKPHOS, BILITOT, PROT, ALBUMIN in the last 168 hours. No results for input(s): LIPASE, AMYLASE in the last 168 hours. No results for input(s): AMMONIA in the last 168 hours. CBC:  Recent Labs Lab 06/12/15 1553  WBC 8.0  HGB 11.4*  HCT 34.4*  MCV 95.8  PLT 355   Cardiac Enzymes: No results for input(s): CKTOTAL, CKMB, CKMBINDEX, TROPONINI in the last 168 hours.  BNP (last 3 results)  Recent Labs  11/25/14 1732  BNP 660.0*    ProBNP (last 3 results) No results for input(s): PROBNP in the last 8760 hours.  CBG: No results for input(s): GLUCAP in the last 168 hours.  Radiological Exams on Admission: Dg Chest 2 View  06/12/2015   CLINICAL DATA:  Productive cough 1 month.  EXAM: CHEST  2 VIEW  COMPARISON:  November 25, 2014.  FINDINGS: Stable cardiomediastinal silhouette. Left-sided pacemaker is unchanged in position. No pneumothorax is noted. Anterior osteophyte formation is noted in lower thoracic spine. Left lung is clear. Right basilar opacity is noted which is slightly decreased compared to prior exam, most consistent with pneumonia or atelectasis with associated pleural effusion. Stable calcified granuloma is noted in right midlung.  IMPRESSION: Mild right basilar opacity is noted concerning for pneumonia or atelectasis with associated pleural effusion.   Electronically Signed   By: Marijo Conception, M.D.   On: 06/12/2015 16:23    EKG: Independently reviewed.  Abnormal findings: QTC 528, paced rhythm  Assessment/Plan Principal Problem:   CAP (community acquired pneumonia): Probable Active Problems:   Anxiety state   Depression   GERD   Myasthenia gravis   Essential hypertension, benign    Complete heart block   CAD (coronary artery disease)   Dyspnea   Chronic combined systolic and diastolic  congestive heart failure   Alcohol abuse   COPD (chronic obstructive pulmonary disease)   Hoarding disorder with poor insight  CAP (community acquired pneumonia): Patient's productive cough and shortness of breath are likely caused by CAP as evidenced by chest x-ray. He is not septic on admission. Hemodynamically stable. He had chest pain several days ago, which has completely resolved, less likely to have PE. His CHF seems to be compensated, he only has trace amount of leg edema. Given his history of splenectomy and immunosuppression status, he needs antibiotics with broad coverage. In addition, patient has choking on eating, indicating possible aspiration pneumonia.  - Will admit to Telemetry Bed - IV Vancomycin and Zosyn (patient received 1 dose of rocephin and Azithyro,  will switch to vancomycin and the Zosyn - Mucinex for cough  - DuoNeb and albuterol Nebor SOB - Urine legionella and S. pneumococcal antigen - Follow up blood culture x2, sputum culture, plus Flu pcr - will get Procalcitonin and trend lactic acid  - NPO until SLP done  COPD: Patient has rhonchi on lung auscultation, but no wheezing, does not seem to have acute exacerbation. -Continue home Symbicort -Breathing treatment as above  Depression and anxiety: Stable, no suicidal or homicidal ideations. -Continue home medications: Lexapro  MG: stable -continue Imuran  GERD: -Protonix  HTN: -On lasix and spironolactone, which are also for CHF  Complete heart block: -has pace maker  CAD (coronary artery disease): Currently no chest pain. -aspirin  Chronic combined systolic and diastolic congestive heart failure: 2-D echo on 08/22/13 showed EF of 40-45% with grade 3 diastolic dysfunction. Patient is on Lasix 60 mg daily and spironolactone at home. CHF seems to be compensated, patient has only trace amount of leg  edema. -Continue home Lasix and spironolactone -Check BNP  Alcohol abuse: -Did counseling about the importance of quitting drinking -CIWA protocol  BPH: stable - Continue Proscar   DVT ppx: SQ Lovenox  Code Status: DNR Family Communication:  Yes, patient's daughter at bed side Disposition Plan: Admit to inpatient   Date of Service 06/12/2015    Ivor Costa [facility] Pager [phone]  If 7PM-7AM, please contact night-coverage [email or web address] Password [email or web address] 06/12/2015, 10:10 PM

## 2015-06-13 ENCOUNTER — Inpatient Hospital Stay (HOSPITAL_COMMUNITY): Payer: Medicare Other

## 2015-06-13 DIAGNOSIS — I2510 Atherosclerotic heart disease of native coronary artery without angina pectoris: Secondary | ICD-10-CM

## 2015-06-13 DIAGNOSIS — I482 Chronic atrial fibrillation: Secondary | ICD-10-CM

## 2015-06-13 DIAGNOSIS — R079 Chest pain, unspecified: Secondary | ICD-10-CM

## 2015-06-13 LAB — APTT: APTT: 34 s (ref 24–37)

## 2015-06-13 LAB — INFLUENZA PANEL BY PCR (TYPE A & B)
H1N1FLUPCR: NOT DETECTED
INFLAPCR: NEGATIVE
Influenza B By PCR: NEGATIVE

## 2015-06-13 LAB — PROCALCITONIN: Procalcitonin: 0.26 ng/mL

## 2015-06-13 LAB — EXPECTORATED SPUTUM ASSESSMENT W REFEX TO RESP CULTURE

## 2015-06-13 LAB — TROPONIN I: Troponin I: <0.03 ng/mL (ref <0.031)

## 2015-06-13 LAB — PROTIME-INR
INR: 1.11 (ref 0.00–1.49)
Prothrombin Time: 14.5 seconds (ref 11.6–15.2)

## 2015-06-13 LAB — CBG MONITORING, ED: Glucose-Capillary: 155 mg/dL — ABNORMAL HIGH (ref 65–99)

## 2015-06-13 LAB — LACTIC ACID, PLASMA
Lactic Acid, Venous: 1.5 mmol/L (ref 0.5–2.0)
Lactic Acid, Venous: 1.3 mmol/L (ref 0.5–2.0)

## 2015-06-13 LAB — BRAIN NATRIURETIC PEPTIDE: B Natriuretic Peptide: 461.2 pg/mL — ABNORMAL HIGH (ref 0.0–100.0)

## 2015-06-13 LAB — STREP PNEUMONIAE URINARY ANTIGEN: Strep Pneumo Urinary Antigen: NEGATIVE

## 2015-06-13 LAB — EXPECTORATED SPUTUM ASSESSMENT W GRAM STAIN, RFLX TO RESP C

## 2015-06-13 MED ORDER — NITROGLYCERIN 0.4 MG SL SUBL: 0.4000 mg | SUBLINGUAL_TABLET | SUBLINGUAL | Status: DC | PRN

## 2015-06-13 MED ORDER — ACETAMINOPHEN 650 MG RE SUPP
650.0000 mg | RECTAL | Status: DC | PRN
  Administered 2015-06-13: 650 mg via RECTAL
  Filled 2015-06-13 (×2): qty 1

## 2015-06-13 MED ORDER — IPRATROPIUM-ALBUTEROL 0.5-2.5 (3) MG/3ML IN SOLN
3.0000 mL | Freq: Two times a day (BID) | RESPIRATORY_TRACT | Status: DC
  Administered 2015-06-13 – 2015-06-15 (×4): 3 mL via RESPIRATORY_TRACT
  Filled 2015-06-13 (×4): qty 3

## 2015-06-13 MED ORDER — MAGNESIUM SULFATE 2 GM/50ML IV SOLN
2.0000 g | Freq: Once | INTRAVENOUS | Status: AC
  Administered 2015-06-13: 2 g via INTRAVENOUS
  Filled 2015-06-13: qty 50

## 2015-06-13 NOTE — ED Notes (Signed)
Dr. Tyrell Antonio at bedside, nurse called to room, pt was sitting on side of bed and fell backwards on bed, reports he was dizzy.  BS and VS checked.

## 2015-06-13 NOTE — Evaluation (Addendum)
Occupational Therapy Evaluation Patient Details Name: [name] [name] MRN: [id number] DOB: 20-Jun-1929 Today's Date: 06/13/2015    History of Present Illness HPI: [name] [name] is a 81 y.o. male with PMH of s/p of splenectomy, MG on Imuran, combined systolic and diastolic congestive heart failure (EF of 40-45% with grade 3 diastolic dysfunction), COPD, GERD, depression, anxiety, complete heart block, pacemaker placement, BPH, arthritis, alcohol abuse, liver cirrhosis, skin cancer, history of esophageal stricture, who presents with productive cough and shortness of breath.   Clinical Impression   Pt admitted with above. Pt independent with ADLs, PTA. Feel pt will benefit from acute OT to increase strength, independence, and safety prior to d/c.    Follow Up Recommendations  Home health OT    Equipment Recommendations  None recommended by OT   Recommendations for Other Services       Precautions / Restrictions Precautions Precautions: Fall Precaution Comments: Fall risk is greatly reduced with use of RW Restrictions Weight Bearing Restrictions: No      Mobility Bed Mobility         General bed mobility comments: not assessed  Transfers Overall transfer level: Needs assistance  Transfers: Sit to/from Stand Sit to Stand: Supervision           Balance Pt unsteady with ambulation-ambulated holding to IV pole and also without doing so-Min guard.                           ADL Overall ADL's : Needs assistance/impaired                     Lower Body Dressing: Min guard;Sit to/from stand   Toilet Transfer: Min guard;Ambulation (sit to stand from chair; supervision for sit to stand)      Tub Transfer: Min assist; Ambulating (practiced stepping over simulated tub in one direction, but not back over as pt reported he normally sits on edge of tub and swings legs in)       Functional mobility during ADLs: Min guard (walked with and without IV  pole) General ADL Comments: Educated on safety such as sitting for getting clothing over feet and rugs/items on floor. Discussed shower chair and pt declined. He reports he sits on edge of tub and swings legs over and it works for him.     Vision     Perception     Praxis      Pertinent Vitals/Pain Pain Assessment: 0-10 Pain Score: 4  Faces Pain Scale: Hurts a little bit Pain Location: joints all over, right side of face/throat Pain Descriptors / Indicators: Aching;Sore Pain Intervention(s): Monitored during session     Hand Dominance     Extremity/Trunk Assessment Upper Extremity Assessment Upper Extremity Assessment: Generalized weakness   Lower Extremity Assessment Lower Extremity Assessment: Defer to PT evaluation (edema noted in LLE)       Communication Communication Communication: No difficulties   Cognition Arousal/Alertness: Awake/alert Behavior During Therapy: WFL for tasks assessed/performed Overall Cognitive Status: No family/caregiver present to determine baseline cognitive functioning (decreased safety awareness)                     General Comments       Exercises       Shoulder Instructions      Home Living Family/patient expects to be discharged to:: Private residence Living Arrangements: Alone Available Help at Discharge: Family Type of Home: House Home Access: Stairs  to enter Entrance Stairs-Number of Steps: 3 Entrance Stairs-Rails: Right Home Layout: One level     Bathroom Shower/Tub: Teacher, early years/pre: Standard     Home Equipment: Environmental consultant - 2 wheels;Bedside commode;Toilet riser;Adaptive equipment;Grab bars - toilet;Grab bars - tub/shower Adaptive Equipment: Reacher Additional Comments: Noted pt's daughter's concern about pt fitting RW into house due to clutter      Prior Functioning/Environment Level of Independence: Independent        Comments: per PT eval, Reports he uses RW prn;  Pt's daughter  reports he lives alone, and is a heavy drinker and is opioid dependent, 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 She also reports that he is a very dramatic person and very manipulative and is very volatile.    OT Diagnosis: Generalized weakness   OT Problem List: Pain;Increased edema;Decreased knowledge of precautions;Decreased knowledge of use of DME or AE;Decreased safety awareness;Decreased strength;Impaired balance (sitting and/or standing)   OT Treatment/Interventions: Balance training;Patient/family education;Cognitive remediation/compensation;Self-care/ADL training;Therapeutic exercise;DME and/or AE instruction;Therapeutic activities    OT Goals(Current goals can be found in the care plan section) Acute Rehab OT Goals Patient Stated Goal: not stated OT Goal Formulation: With patient Time For Goal Achievement: 06/20/15 Potential to Achieve Goals: Good ADL Goals Pt Will Perform Lower Body Dressing: with modified independence;sit to/from stand Pt Will Transfer to Toilet: with modified independence;ambulating;grab bars (elevated toilet) Pt Will Perform Toileting - Clothing Manipulation and hygiene: with modified independence;sit to/from stand Additional ADL Goal #1: Pt will independently perform HEP for bilateral UEs to increase strength.  OT Frequency: Min 2X/week   Barriers to D/C:            Co-evaluation              End of Session Equipment Utilized During Treatment: Gait belt  Activity Tolerance: Patient tolerated treatment well Patient left: in chair;with call bell/phone within reach;with chair alarm set   Time: 8299-3716 OT Time Calculation (min): 18 min Charges:  OT General Charges $OT Visit: 1 Procedure OT Evaluation $Initial OT Evaluation Tier I: 1 Procedure G-CodesBenito [name] OTR/L [id number] 06/13/2015, 5:17 PM

## 2015-06-13 NOTE — ED Notes (Signed)
Pt ate 100% of meal, nurse in room while pt eating per MD request.

## 2015-06-13 NOTE — ED Notes (Signed)
Pt sitting on side of bed - ST in w/pt - observed pt swallow his meds. Pt was able to swallow some w/apple sauce and others w/water. Pt denied any difficulty w/breathing or swallowing when completed. Pt then advised, "I forgot to mention I woke up with chest pain this morning." Pt c/o chest pain now - pt held right hand up to mid-left chest - states feels like stabbing pain. States occurring intermittently - last few seconds. Assisted pt w/lying in bed so EKG may be performed.

## 2015-06-13 NOTE — Evaluation (Signed)
Clinical/Bedside Swallow Evaluation Patient Details  Name: [name] [name] MRN: [id number] Date of Birth: 1929-09-07  Today's Date: 06/13/2015 Time: SLP Start Time (ACUTE ONLY): 0935 SLP Stop Time (ACUTE ONLY): 1005 SLP Time Calculation (min) (ACUTE ONLY): 30 min  Past Medical History:  Past Medical History  Diagnosis Date  . Complete heart block     s/p PPM  . PAF (paroxysmal atrial fibrillation)   . HTN (hypertension)   . HLD (hyperlipidemia)   . CAD (coronary artery disease)   . Depression   . Anxiety   . Hemorrhoids, external   . GERD (gastroesophageal reflux disease)   . Hyperplastic colonic polyp   . Diverticulosis of colon   . COPD (chronic obstructive pulmonary disease)   . Myasthenia gravis   . Arthritis, rheumatoid   . BPH (benign prostatic hypertrophy)   . Cirrhosis of liver   . Asthma   . Alcohol abuse   . Hiatal hernia   . Skin cancer of forehead     and left ear  . Lung nodule   . Nephrolithiasis   . CHF (congestive heart failure)   . Pacemaker   . Herpes zoster   . Hoarding disorder with poor insight   . Esophageal stricture    Past Surgical History:  Past Surgical History  Procedure Laterality Date  . Total knee arthroplasty      bilateral  . Total hip arthroplasty      x3  . Splenectomy    . Appendectomy    . Pacemaker insertion  2009    medtronic  . Cardiac catheterization  10/12/2007    EF 60%  . Inguinal hernia repair    . Esophagogastroduodenoscopy N/A 09/23/2014    Procedure: ESOPHAGOGASTRODUODENOSCOPY (EGD);  Surgeon: Ladene Artist, MD;  Location: [facility];  Service: Endoscopy;  Laterality: N/A;  . Savory dilation N/A 09/23/2014    Procedure: SAVORY DILATION;  Surgeon: Ladene Artist, MD;  Location: [facility];  Service: Endoscopy;  Laterality: N/A;  . Skin cancer excision     HPI:  81 y.o. male with PMH of s/p of splenectomy, esophageal stricture, hiatal hernia, myasthenia gravis on Imuran, systolic and diastolic congestive  heart failure  COPD, GERD, depression, anxiety, complete heart block, pacemaker placement, BPH, arthritis, alcohol abuse, liver cirrhosis, skin cancer, who presents with productive cough and shortness of breath. MD note reports daughter reports, patient chokes when he eats solid food sometimes. Chest x-ray showed a right basilar infiltration. Mild right basilar opacity is noted concerning for pneumonia or atelectasis with associated pleural effusion.   Assessment / Plan / Recommendation Clinical Impression  Pt demonstrates a dysfunctional swallow evidenced by facial grimace, effortful swallow and compensation with prolonged laryngeal elevation in attempts to likely clear pharynx. Pt reports having an "ulcer or sore place" in posterior/lower portion oral cavity for prolonged period of time, odnophagia for months and multiple pna's. Pt may benefit from ENT consult/endoscopy to further assess. Recommend Dys 3 diet, thin liquids and objective assessment; pt has indications for FEES and MBS, however FEES may be best to observe anatomy (FEES will be 8/24).    Aspiration Risk  Moderate    Diet Recommendation Dysphagia 3 (Mech soft);Thin   Medication Administration: Whole meds with liquid Compensations: Slow rate;Small sips/bites    Other  Recommendations Recommended Consults: Consider ENT evaluation Oral Care Recommendations: Oral care BID   Follow Up Recommendations       Frequency and Duration min 2x/week  2 weeks  Pertinent Vitals/Pain none        Swallow Study           Oral/Motor/Sensory Function Overall Oral Motor/Sensory Function: Appears within functional limits for tasks assessed   Ice Chips Ice chips: Not tested   Thin Liquid Thin Liquid: Impaired Pharyngeal  Phase Impairments: Multiple swallows;Decreased hyoid-laryngeal movement (maintains larryngeal elevation throughout swallow, effortful)    Nectar Thick Nectar Thick Liquid: Not tested   Honey Thick Honey Thick Liquid:  Not tested   Puree Puree: Impaired Pharyngeal Phase Impairments: Decreased hyoid-laryngeal movement;Multiple swallows (maintains larryngeal elevation throughout swallow, effortful)   Solid   GO    Solid: Impaired Oral Phase Functional Implications:  (prolonged transit) Pharyngeal Phase Impairments: Decreased hyoid-laryngeal movement;Multiple swallows       [name] [name] 06/13/2015,10:17 AM  [name] [name].[employer] [phone]

## 2015-06-13 NOTE — Progress Notes (Signed)
Pt admitted form ED, pt  a/o, c/o generalized pain, PRN meds given as ordered, pt receiving IV abx, VSS, pt stable

## 2015-06-13 NOTE — Consult Note (Signed)
Reason for Consult: CP/Syncope  Requesting Physician: Regaldo  HPI: [name]  is a 81 year old male cachectic appearing Caucasian male patient of Dr. Collier Salina Jordan's who was admitted with community acquired pneumonia. He has had symptoms for 2 weeks notable for productive cough. He does have a history of CAD medically managed, chronic A. Fib, permanent transvenous pacemaker insertion followed by Dr. Rayann Heman as well as COPD, history of alcohol abuse with cirrhosis. He was seen by Dr. Martinique 08/18/14 and was clinically stable. It was reprogrammed to the VVI mode. He did have a 2-D echo performed 09/01/13 revealing an ejection fraction of 40-45% with grade 3 diastolic dysfunction, moderate decrease in RV function with moderate increase in pulmonary artery pressures of 46 mmHg. Myoview stress test/5/14 was negative. Probably by the hospitalist apparently had transient loss of consciousness which has happened in the past. He also has atypical sharp transient chest pain. His chest x-ray is notable for right lower lobe pneumonia with atelectasis and associated passive effusion. His troponin was negative and his BNP is 461. His EKG shows a probable branch block with a paced rhythm.   Problem List: Patient Active Problem List   Diagnosis Date Noted  . Hoarding disorder with poor insight 11/29/2014  . Alcohol abuse 11/26/2014  . COPD (chronic obstructive pulmonary disease) 11/26/2014  . Anemia due to other cause 11/26/2014  . Chronic combined systolic and diastolic congestive heart failure 03/10/2014  . Herpes zoster 12/20/2013  . SOB (shortness of breath) 11/04/2013  . CHF (congestive heart failure) 11/03/2013  . Dyspnea 11/03/2013  . CAP (community acquired pneumonia): Probable 11/03/2013  . COPD with acute exacerbation: Probable 11/03/2013  . Bronchitis: Probable 11/03/2013  . Postural dizziness 02/13/2012  . CAD (coronary artery disease) 03/15/2011  . Essential hypertension, benign  12/13/2010  . Complete heart block 12/13/2010  . Atrial fibrillation 12/13/2010  . Anxiety state 03/04/2008  . Depression 03/04/2008  . EXTERNAL HEMORRHOIDS 03/04/2008  . ASTHMA 03/04/2008  . GERD 03/04/2008  . DIVERTICULOSIS, COLON 03/04/2008  . NEPHROLITHIASIS 03/04/2008  . ARTHRITIS, RHEUMATOID 03/04/2008  . Myasthenia gravis 03/04/2008  . COLONIC POLYPS, HYPERPLASTIC 02/04/2006    PMHx:  Past Medical History  Diagnosis Date  . Complete heart block     s/p PPM  . PAF (paroxysmal atrial fibrillation)   . HTN (hypertension)   . HLD (hyperlipidemia)   . CAD (coronary artery disease)   . Depression   . Anxiety   . Hemorrhoids, external   . GERD (gastroesophageal reflux disease)   . Hyperplastic colonic polyp   . Diverticulosis of colon   . COPD (chronic obstructive pulmonary disease)   . Myasthenia gravis   . Arthritis, rheumatoid   . BPH (benign prostatic hypertrophy)   . Cirrhosis of liver   . Asthma   . Alcohol abuse   . Hiatal hernia   . Skin cancer of forehead     and left ear  . Lung nodule   . Nephrolithiasis   . CHF (congestive heart failure)   . Pacemaker   . Herpes zoster   . Hoarding disorder with poor insight   . Esophageal stricture    Past Surgical History  Procedure Laterality Date  . Total knee arthroplasty      bilateral  . Total hip arthroplasty      x3  . Splenectomy    . Appendectomy    . Pacemaker insertion  2009    medtronic  . Cardiac catheterization  10/12/2007  EF 60%  . Inguinal hernia repair    . Esophagogastroduodenoscopy N/A 09/23/2014    Procedure: ESOPHAGOGASTRODUODENOSCOPY (EGD);  Surgeon: Ladene Artist, MD;  Location: [facility];  Service: Endoscopy;  Laterality: N/A;  . Savory dilation N/A 09/23/2014    Procedure: SAVORY DILATION;  Surgeon: Ladene Artist, MD;  Location: [facility];  Service: Endoscopy;  Laterality: N/A;  . Skin cancer excision      FAMHx: Family History  Problem Relation Age of Onset  .  Myasthenia gravis Mother   . Esophageal cancer Father   . HIV Son     Aids  . Diabetes Son   . Heart disease Maternal Uncle   . Colon cancer Other     Cousin on Mother's side  . Colon polyps Neg Hx     SOCHx:  reports that he quit smoking about 27 years ago. His smoking use included Cigars. He has never used smokeless tobacco. He reports that he drinks about 8.4 oz of alcohol per week. He reports that he does not use illicit drugs.  ALLERGIES: Allergies  Allergen Reactions  . Celebrex [Celecoxib] Nausea And Vomiting and Other (See Comments)    Stomach ulcers and bleeding  . Alendronate Sodium Other (See Comments)    Unknown.  . Benazepril Other (See Comments)    Unknown.  . Remicade [Infliximab] Other (See Comments)    ROS: A comprehensive review of systems was negative.  HOME MEDICATIONS:  (Not in a hospital admission)  HOSPITAL MEDICATIONS: I have reviewed the patient's current medications.  VITALS: Blood pressure 126/72, pulse 67, temperature 98.6 F (37 C), temperature source Oral, resp. rate 19, SpO2 98 %.  INPUT/OUTPUT I/O last 3 completed shifts: In: -  Out: 100 [Urine:100]      PHYSICAL EXAM: General appearance: alert and no distress Neck: no adenopathy, no JVD, supple, symmetrical, trachea midline, thyroid not enlarged, symmetric, no tenderness/mass/nodules and left carotid bruit Lungs: decreased breath sounds right base with rhonchi Heart: regular rate and rhythm, S1, S2 normal, no murmur, click, rub or gallop Extremities: trace bilateral lower extremity edema  LABS:  BMP  Recent Labs  11/26/14 0455 11/28/14 0830 06/12/15 1553  NA 136 137 136  K 4.4 3.6 4.2  CL 102 99 99*  CO2 26 30 29   GLUCOSE 218* 124* 158*  BUN 13 15 16   CREATININE 0.61 0.67 0.61  CALCIUM 8.4 8.4 8.7*  GFRNONAA 90* 86* >60  GFRAA >90 >90 >60    CBC  Recent Labs Lab 06/12/15 1553  WBC 8.0  RBC 3.59*  HGB 11.4*  HCT 34.4*  PLT 355  MCV 95.8     HEMOGLOBIN A1C Lab Results  Component Value Date   HGBA1C * 10/05/2007    6.3 (NOTE)   The ADA recommends the following therapeutic goals for glycemic   control related to Hgb A1C measurement:   Goal of Therapy:   < 7.0% Hgb A1C   Action Suggested:  > 8.0% Hgb A1C   Ref:  Diabetes Care, 22, Suppl. 1, 1999   MPG 147 10/05/2007    Cardiac Panel (last 3 results)  Recent Labs  06/13/15 1154  TROPONINI <0.03    BNP (last 3 results) No results for input(s): PROBNP in the last 8760 hours.  TSH No results for input(s): TSH in the last 8760 hours.  CHOLESTEROL No results for input(s): CHOL in the last 8760 hours.  Hepatic Function Panel No results for input(s): PROT, ALBUMIN, AST, ALT, ALKPHOS, BILITOT, BILIDIR,  IBILI in the last 8760 hours.  IMAGING: Dg Chest 2 View  06/12/2015   CLINICAL DATA:  Productive cough 1 month.  EXAM: CHEST  2 VIEW  COMPARISON:  November 25, 2014.  FINDINGS: Stable cardiomediastinal silhouette. Left-sided pacemaker is unchanged in position. No pneumothorax is noted. Anterior osteophyte formation is noted in lower thoracic spine. Left lung is clear. Right basilar opacity is noted which is slightly decreased compared to prior exam, most consistent with pneumonia or atelectasis with associated pleural effusion. Stable calcified granuloma is noted in right midlung.  IMPRESSION: Mild right basilar opacity is noted concerning for pneumonia or atelectasis with associated pleural effusion.   Electronically Signed   By: Marijo Conception, M.D.   On: 06/12/2015 16:23   Tele: paced rhythm  IMPRESSION: 1. Syncope: Patient had brief loss of consciousness without seizure activity. He has had this in the past. His rhythm is paced. He did have a head CT performed. He is being admitted with community acquired pneumonia which she's had for 2 weeks. I do not think that there is a cardiovascular/rhythm component to this episode. 2. Coronary artery disease: Medically  managed 3. Chronic A. Fib: Status post permanent transvenous pacemaker insertion. Not a Coumadin candidate because of falls. 4. Diastolic dysfunction: Patient has an EF of 56-81% with diastolic dysfunction. As of his last office visit with Dr. Martinique he was compliant with his diabetics. 5. Chest pain: He has rare episodes of brief sharp chest pain which do not sound cardiac.   RECOMMENDATION: 1. Treatment for community-acquired pneumonia. He does have mildly elevated BNP at 461. Continue oral diabetics. I do not think he needs a workup for chest pain at this time. Monitor on telemetry. We can interrogate his pacemaker to see if there were any rhythm abnormalities.  Time Spent Directly with Patient: 45 minutes  Quay Burow 06/13/2015, 1:20 PM

## 2015-06-13 NOTE — ED Notes (Signed)
Paging admitting MD to advise of pt's chest pain/EKG.

## 2015-06-13 NOTE — ED Notes (Signed)
Dr Tyrell Antonio in w/pt.

## 2015-06-13 NOTE — Progress Notes (Signed)
[facility] PROGRESS NOTE  [name] [name] WVP:[id number] DOB: 01/29/1929 DOA: 06/12/2015 PCP: [name], MD  Assessment/Plan: CAP (community acquired pneumonia):  - IV Vancomycin and Zosyn. Continue with broad spectrum antibiotics, on immunosuppressive therapy/ Also swallow evaluation rule out aspiration.  - Mucinex for cough  - DuoNeb and albuterol Nebor SOB - Urine legionella and S. pneumococcal antigen - Follow up blood culture x2, sputum culture, plus Flu pcr -Dysphagia 3 diet. Definitive swallow test for tomorrow.   Syncope, cycle cardiac enzymes. CT head. EKG. cardiology consulted.   Throat pain, Ulcer ?  He will need ENT evaluation when stable.   Chest pain; sharp in quality. Also had syncope episode. Cardiology consulted.   Lower extremity edema.  Check doppler.   COPD: Patient has rhonchi on lung auscultation, but no wheezing, does not seem to have acute exacerbation. -Continue home Symbicort -Breathing treatment as above  Depression and anxiety: Stable, no suicidal or homicidal ideations. -Continue home medications: Lexapro  MG: stable -continue Imuran  GERD: -Protonix  HTN: -On lasix and spironolactone, which are also for CHF  Complete heart block: -has pace maker  CAD (coronary artery disease): Currently no chest pain. -aspirin  Chronic combined systolic and diastolic congestive heart failure: 2-D echo on 08/22/13 showed EF of 40-45% with grade 3 diastolic dysfunction. Patient is on Lasix 60 mg daily and spironolactone at home. CHF seems to be compensated, patient has only trace amount of leg edema. -Continue home Lasix and spironolactone - BNP  Alcohol abuse: -Did counseling about the importance of quitting drinking -CIWA protocol  BPH: stable - Continue Proscar  Code Status: DNR Family Communication: care discussed with patient Disposition Plan: remian  inpatient   Consultants:  cardiology  Procedures:  ECHO  Antibiotics:  Vancomycin   zosyn  HPI/Subjective: Complaining of ulcer on his throat, saw Dr Forrestine Him who refer him to ENT. While we were talking patient became unresponsive for a second, eyes open, we lying him down in the bed]. He came back. He denies dys  Objective: Filed Vitals:   06/13/15 1030  BP: 112/67  Pulse: 62  Temp: 98.6 F (37 C)  Resp: 17    Intake/Output Summary (Last 24 hours) at 06/13/15 1112 Last data filed at 06/13/15 0309  Gross per 24 hour  Intake      0 ml  Output    100 ml  Net   -100 ml   There were no vitals filed for this visit.  Exam:   General: Alert in no acute distress.   Cardiovascular: S 1, S 2 RRR  Respiratory: CTA  Abdomen: BS present, soft.  Musculoskeletal: trace edema  Data Reviewed: Basic Metabolic Panel:  Recent Labs Lab 06/12/15 1553  NA 136  K 4.2  CL 99*  CO2 29  GLUCOSE 158*  BUN 16  CREATININE 0.61  CALCIUM 8.7*   Liver Function Tests: No results for input(s): AST, ALT, ALKPHOS, BILITOT, PROT, ALBUMIN in the last 168 hours. No results for input(s): LIPASE, AMYLASE in the last 168 hours. No results for input(s): AMMONIA in the last 168 hours. CBC:  Recent Labs Lab 06/12/15 1553  WBC 8.0  HGB 11.4*  HCT 34.4*  MCV 95.8  PLT 355   Cardiac Enzymes: No results for input(s): CKTOTAL, CKMB, CKMBINDEX, TROPONINI in the last 168 hours. BNP (last 3 results)  Recent Labs  11/25/14 1732 06/13/15 0318  BNP 660.0* 461.2*    ProBNP (last 3 results) No results for input(s): PROBNP in the  last 8760 hours.  CBG: No results for input(s): GLUCAP in the last 168 hours.  Recent Results (from the past 240 hour(s))  Culture, sputum-assessment     Status: None   Collection Time: 06/13/15  6:12 AM  Result Value Ref Range Status   Specimen Description SPUTUM  Final   Special Requests Immunocompromised  Final   Sputum evaluation   Final     THIS SPECIMEN IS ACCEPTABLE. RESPIRATORY CULTURE REPORT TO FOLLOW.   Report Status 06/13/2015 FINAL  Final     Studies: Dg Chest 2 View  06/12/2015   CLINICAL DATA:  Productive cough 1 month.  EXAM: CHEST  2 VIEW  COMPARISON:  November 25, 2014.  FINDINGS: Stable cardiomediastinal silhouette. Left-sided pacemaker is unchanged in position. No pneumothorax is noted. Anterior osteophyte formation is noted in lower thoracic spine. Left lung is clear. Right basilar opacity is noted which is slightly decreased compared to prior exam, most consistent with pneumonia or atelectasis with associated pleural effusion. Stable calcified granuloma is noted in right midlung.  IMPRESSION: Mild right basilar opacity is noted concerning for pneumonia or atelectasis with associated pleural effusion.   Electronically Signed   By: Marijo Conception, M.D.   On: 06/12/2015 16:23    Scheduled Meds: . aspirin EC  81 mg Oral Daily  . azaTHIOprine  50 mg Oral TID  . budesonide-formoterol  2 puff Inhalation BID  . dextromethorphan-guaiFENesin  1 tablet Oral BID  . enoxaparin (LOVENOX) injection  40 mg Subcutaneous Daily  . escitalopram  20 mg Oral Daily  . finasteride  5 mg Oral Daily  . furosemide  40 mg Oral Daily  . ipratropium-albuterol  3 mL Nebulization BID  . LORazepam  0-4 mg Oral 4 times per day   Followed by  . [START ON 06/15/2015] LORazepam  0-4 mg Oral Q12H  . omega-3 acid ethyl esters  1 g Oral Daily  . pantoprazole  40 mg Oral Daily  . silver sulfADIAZINE  1 application Topical Daily  . spironolactone  25 mg Oral Daily  . temazepam  15 mg Oral QHS  . thiamine  100 mg Oral Daily   Or  . thiamine  100 mg Intravenous Daily  . triamcinolone cream  1 application Topical Daily  . vancomycin  750 mg Intravenous Q12H   Continuous Infusions: . magnesium sulfate    . piperacillin-tazobactam (ZOSYN)  IV Stopped (06/13/15 1000)    Principal Problem:   CAP (community acquired pneumonia): Probable Active  Problems:   Anxiety state   Depression   GERD   Myasthenia gravis   Essential hypertension, benign   Complete heart block   CAD (coronary artery disease)   Dyspnea   Chronic combined systolic and diastolic congestive heart failure   Alcohol abuse   COPD (chronic obstructive pulmonary disease)   Hoarding disorder with poor insight    Time spent: 35 minutes    [name] [name], [facility] Pager [phone]. If 7PM-7AM, please contact night-coverage at [email or web address], password [facility] 06/13/2015, 11:12 AM  LOS: 1 day

## 2015-06-13 NOTE — Progress Notes (Signed)
  Echocardiogram 2D Echocardiogram has been performed.  [name] 06/13/2015, 3:27 PM

## 2015-06-13 NOTE — ED Notes (Signed)
Attempted report 

## 2015-06-13 NOTE — ED Notes (Signed)
Admitting MD aware pt requesting pain med for chronic pain. States takes Percocet x 2 BID. Pt is NPO so no po meds may be given at this time. Dr ordered Tylenol Suppository d/t waiting for ST and does not want pt to be sedated for this - pt voiced understanding - stating he will take anything that may help his pain.

## 2015-06-13 NOTE — Evaluation (Signed)
Physical Therapy Evaluation Patient Details Name: [name] [name] MRN: [id number] DOB: 1929-01-13 Today's Date: 06/13/2015   History of Present Illness  HPI: [name] [name] is a 81 y.o. male with PMH of s/p of splenectomy, MG on Imuran, combined systolic and diastolic congestive heart failure (EF of 40-45% with grade 3 diastolic dysfunction), COPD, GERD, depression, anxiety, complete heart block, pacemaker placement, BPH, arthritis, alcohol abuse, liver cirrhosis, skin cancer, history of esophageal stricture, who presents with productive cough and shortness of breath.  Clinical Impression  Pt admitted with above diagnosis. Pt currently with functional limitations due to the deficits listed below (see PT Problem List).  Pt will benefit from skilled PT to increase their independence and safety with mobility to allow discharge to the venue listed below.       Follow Up Recommendations Home health PT;Other (comment) (HHSW)    Equipment Recommendations  None recommended by PT    Recommendations for Other Services OT consult     Precautions / Restrictions Precautions Precautions: Fall Precaution Comments: Fall risk is greatly reduced with use of RW Restrictions Weight Bearing Restrictions: No      Mobility  Bed Mobility Overal bed mobility: Modified Independent                Transfers Overall transfer level: Needs assistance Equipment used: None Transfers: Sit to/from Stand Sit to Stand: Min guard         General transfer comment: [place]ard for safety, especially given his fall back to bed in ED earlier today; Stood impulsivley; cues to take it slow and controlled; Orthostatics taken  Ambulation/Gait Ambulation/Gait assistance: Min assist;+2 safety/equipment;Supervision Ambulation Distance (Feet): 240 Feet Assistive device: None;Rolling walker (2 wheeled) Gait Pattern/deviations: Step-through pattern;Narrow base of support (erratic step width with anb without assistive  device)     General Gait Details: Min assist for balance without assistive device, with noted small losses of balance and tendency to reach out for UE support; much better with bilateral UE support fo rm RW  Stairs            Wheelchair Mobility    Modified Rankin (Stroke Patients Only)       Balance Overall balance assessment: Needs assistance           Standing balance-Leahy Scale: Poor                               Pertinent Vitals/Pain Pain Assessment: Faces Faces Pain Scale: Hurts a little bit Pain Location: chronic hip and RA pain; he did not rate Pain Descriptors / Indicators: Constant Pain Intervention(s): Monitored during session    Home Living Family/patient expects to be discharged to:: Private residence Living Arrangements: Alone Available Help at Discharge: Family Type of Home: House Home Access: Stairs to enter Entrance Stairs-Rails: Right Entrance Stairs-Number of Steps: 3 Home Layout: One level Home Equipment: Environmental consultant - 2 wheels;Bedside commode Additional Comments: Noted pt's daughter's concern about pt fitting RW into house due to clutter    Prior Function Level of Independence: Independent         Comments: Reports he uses RW prn;  Pt's daughter reports he lives alone, and is a heavy drinker and is opioid dependent, 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 She also reports that he is a very dramatic person and very manipulative and is very volatile.     Hand Dominance  Extremity/Trunk Assessment   Upper Extremity Assessment: Defer to OT evaluation           Lower Extremity Assessment: Generalized weakness         Communication   Communication: No difficulties  Cognition Arousal/Alertness: Awake/alert Behavior During Therapy: Impulsive Overall Cognitive Status: Within Functional Limits for tasks assessed (VERY tangential in conversation)                      General  Comments General comments (skin integrity, edema, etc.): Session conducted on Room Air and O2 sats remained greater than or equal to 97%   06/13/15 1534  Vital Signs  Patient Position (if appropriate) Orthostatic Vitals  Orthostatic Lying   BP- Lying 160/78 mmHg  Pulse- Lying 71  Orthostatic Sitting  BP- Sitting 156/89 mmHg  Pulse- Sitting 78  Orthostatic Standing at 0 minutes  BP- Standing at 0 minutes 148/79 mmHg  Pulse- Standing at 0 minutes 67  Orthostatic Standing at 3 minutes  BP- Standing at 3 minutes 160/71 mmHg  Pulse- Standing at 3 minutes 63       Exercises        Assessment/Plan    PT Assessment Patient needs continued PT services  PT Diagnosis Difficulty walking;Generalized weakness   PT Problem List Decreased strength;Decreased balance;Decreased mobility;Decreased knowledge of use of DME;Decreased safety awareness  PT Treatment Interventions DME instruction;Gait training;Stair training;Functional mobility training;Therapeutic activities;Therapeutic exercise;Balance training;Patient/family education   PT Goals (Current goals can be found in the Care Plan section) Acute Rehab PT Goals Patient Stated Goal: Happy to walk PT Goal Formulation: With patient Time For Goal Achievement: 06/27/15 Potential to Achieve Goals: Good    Frequency Min 3X/week   Barriers to discharge Inaccessible home environment;Decreased caregiver support Pt's daughter has voiced numerous times her concern for Mr. Badia' safety at home    Co-evaluation               End of Session Equipment Utilized During Treatment: Gait belt Activity Tolerance: Patient tolerated treatment well Patient left: in chair;with call bell/phone within reach;with chair alarm set Nurse Communication: Mobility status         Time: 1528-1550 PT Time Calculation (min) (ACUTE ONLY): 22 min   Charges:   PT Evaluation $Initial PT Evaluation Tier I: 1 Procedure     PT G CodesQuin Hoop 06/13/2015, 4:29 PM  [name], [place] Pager [phone] Office [phone]

## 2015-06-13 NOTE — ED Notes (Signed)
Patient transported to CT 

## 2015-06-14 ENCOUNTER — Encounter (HOSPITAL_COMMUNITY): Payer: Self-pay | Admitting: Physician Assistant

## 2015-06-14 ENCOUNTER — Inpatient Hospital Stay (HOSPITAL_COMMUNITY): Payer: Medicare Other

## 2015-06-14 DIAGNOSIS — J189 Pneumonia, unspecified organism: Principal | ICD-10-CM

## 2015-06-14 DIAGNOSIS — I5042 Chronic combined systolic (congestive) and diastolic (congestive) heart failure: Secondary | ICD-10-CM

## 2015-06-14 DIAGNOSIS — R55 Syncope and collapse: Secondary | ICD-10-CM

## 2015-06-14 DIAGNOSIS — G7000 Myasthenia gravis without (acute) exacerbation: Secondary | ICD-10-CM

## 2015-06-14 DIAGNOSIS — R609 Edema, unspecified: Secondary | ICD-10-CM

## 2015-06-14 LAB — LEGIONELLA ANTIGEN, URINE

## 2015-06-14 NOTE — Care Management Note (Signed)
Case Management Note  Patient Details  Name: [name] [name] MRN: [id number] Date of Birth: Mar 14, 1929  Subjective/Objective:       Admitted with CAP             Action/Plan: Patient lives alone, continue to drive, grocery shopping. Independent of ADL's as prior to admission. Daughter lives close by and assist as needed. Patient has medical insurance with Medicare and AARP with prescription drug coverage and does not have any problems getting his medication. Pharmacy of choice is [facility]. He does not use any DME at home, just complains of pain from arthritis and MG. Physical Therapy recommends HHPT, patient refused HHC at this time, stated "I'm too active for that."  Patient with lots of complaints about his rt throat pain and wants it treated before he goes home.  Expected Discharge Date:    possible 06/16/2015              Expected Discharge Plan:  Home/Self Care  Discharge planning Services  CM Consult     Choice offered to:  Patient   HH Arranged:  Patient Refused      Status of Service:  In process, will continue to follow  Sherrilyn Rist [phone] 06/14/2015, 1:31 PM

## 2015-06-14 NOTE — Progress Notes (Signed)
PROGRESS NOTE  Subjective:   81 yo with pneumonia. Was incidentally found to have a minimally elevated BNP. No cardiac symptoms significnat rhonchi and dyspnea  Hs of alcohol abuse and cirrhosis   Objective:    Vital Signs:   Temp:  [98.5 F (36.9 C)-98.6 F (37 C)] 98.5 F (36.9 C) (08/24 0241) Pulse Rate:  [62-69] 69 (08/24 1117) Resp:  [16-20] 16 (08/24 1117) BP: (118-149)/(54-85) 118/54 mmHg (08/24 1117) SpO2:  [96 %-100 %] 99 % (08/24 1150) Weight:  [69.355 kg (152 lb 14.4 oz)] 69.355 kg (152 lb 14.4 oz) (08/23 1405)  Last BM Date: 06/13/15   24-hour weight change: Weight change:   Weight trends: Filed Weights   06/13/15 1405  Weight: 69.355 kg (152 lb 14.4 oz)    Intake/Output:  08/23 0701 - 08/24 0700 In: 830 [P.O.:480; I.V.:100; IV Piggyback:250] Out: 1370 [Urine:1370] Total I/O In: 240 [P.O.:240] Out: 275 [Urine:275]   Physical Exam: BP 118/54 mmHg  Pulse 69  Temp(Src) 98.5 F (36.9 C) (Oral)  Resp 16  Wt 69.355 kg (152 lb 14.4 oz)  SpO2 99%  Wt Readings from Last 3 Encounters:  06/13/15 69.355 kg (152 lb 14.4 oz)  05/24/15 71.725 kg (158 lb 2 oz)  03/06/15 74.299 kg (163 lb 12.8 oz)    General: Vital signs reviewed and noted. , tan   Head: Normocephalic, atraumatic.  Eyes: conjunctivae/corneas clear.  EOM's intact.   Throat: normal  Neck:  normal   Lungs:    extensive rhonchi   Heart:  RR   Abdomen:  Soft, non-tender, non-distended    Extremities: No edema    Neurologic: A&O X3, CN II - XII are grossly intact.   Psych: Normal     Labs: BMET:  Recent Labs  06/12/15 1553  NA 136  K 4.2  CL 99*  CO2 29  GLUCOSE 158*  BUN 16  CREATININE 0.61  CALCIUM 8.7*    Liver function tests: No results for input(s): AST, ALT, ALKPHOS, BILITOT, PROT, ALBUMIN in the last 72 hours. No results for input(s): LIPASE, AMYLASE in the last 72 hours.  CBC:  Recent Labs  06/12/15 1553  WBC 8.0  HGB 11.4*  HCT 34.4*  MCV 95.8    PLT 355    Cardiac Enzymes:  Recent Labs  06/13/15 1154 06/13/15 1635 06/13/15 2235  TROPONINI <0.03 <0.03 <0.03    Coagulation Studies:  Recent Labs  06/13/15 0035  LABPROT 14.5  INR 1.11    Other: Invalid input(s): POCBNP No results for input(s): DDIMER in the last 72 hours. No results for input(s): HGBA1C in the last 72 hours. No results for input(s): CHOL, HDL, LDLCALC, TRIG, CHOLHDL in the last 72 hours. No results for input(s): TSH, T4TOTAL, T3FREE, THYROIDAB in the last 72 hours.  Invalid input(s): FREET3 No results for input(s): VITAMINB12, FOLATE, FERRITIN, TIBC, IRON, RETICCTPCT in the last 72 hours.   Other results:  Pacer interrogation    VVI , pacer is working properly     Medications:    Infusions:    Scheduled Medications: . aspirin EC  81 mg Oral Daily  . azaTHIOprine  50 mg Oral TID  . budesonide-formoterol  2 puff Inhalation BID  . dextromethorphan-guaiFENesin  1 tablet Oral BID  . enoxaparin (LOVENOX) injection  40 mg Subcutaneous Daily  . escitalopram  20 mg Oral Daily  . finasteride  5 mg Oral Daily  . furosemide  40 mg Oral Daily  .  ipratropium-albuterol  3 mL Nebulization BID  . LORazepam  0-4 mg Oral 4 times per day   Followed by  . [START ON 06/15/2015] LORazepam  0-4 mg Oral Q12H  . omega-3 acid ethyl esters  1 g Oral Daily  . pantoprazole  40 mg Oral Daily  . piperacillin-tazobactam (ZOSYN)  IV  3.375 g Intravenous Q8H  . silver sulfADIAZINE  1 application Topical Daily  . spironolactone  25 mg Oral Daily  . temazepam  15 mg Oral QHS  . thiamine  100 mg Oral Daily  . triamcinolone cream  1 application Topical Daily  . vancomycin  750 mg Intravenous Q12H    Assessment/ Plan:   Principal Problem:   CAP (community acquired pneumonia): Probable Active Problems:   Anxiety state   Depression   GERD   Myasthenia gravis   Essential hypertension, benign   Complete heart block   CAD (coronary artery disease)    Dyspnea   Chronic combined systolic and diastolic congestive heart failure   Alcohol abuse   COPD (chronic obstructive pulmonary disease)   Hoarding disorder with poor insight  1.  Chronic systolic CHF:   Appears to be stable  Left ventricle: The cavity size was normal. Wall thickness was increased in a pattern of mild LVH. Systolic function was mildly to moderately reduced. The estimated ejection fraction was in the range of 40% to 45%. Diffuse hypokinesis. - Mitral valve: Calcified annulus. There was mild regurgitation. - Left atrium: The atrium was moderately dilated. - Right ventricle: The cavity size was mildly dilated. - Right atrium: The atrium was moderately dilated. - Tricuspid valve: There was severe regurgitation. - Pulmonary arteries: Systolic pressure was mildly increased  He is stable from a cardiac standpoint. Continue treatment for pneumonia   We will sign off.  Call for questions .    Disposition:  Length of Stay: 2  Thayer Headings, Brooke Bonito., MD, [facility] 06/14/2015, 1:00 PM Office [phone] Pager [phone]

## 2015-06-14 NOTE — Procedures (Addendum)
Objective Swallowing Evaluation:  (FEES)  Patient Details  Name: [name] [name] MRN: [id number] Date of Birth: 12/31/1928  Today's Date: 06/14/2015 Time: SLP Start Time (ACUTE ONLY): 0918-SLP Stop Time (ACUTE ONLY): 1003 SLP Time Calculation (min) (ACUTE ONLY): 45 min  Past Medical History:  Past Medical History  Diagnosis Date  . Complete heart block     s/p PPM  . PAF (paroxysmal atrial fibrillation)   . HTN (hypertension)   . HLD (hyperlipidemia)   . CAD (coronary artery disease)   . Depression   . Anxiety   . Hemorrhoids, external   . GERD (gastroesophageal reflux disease)   . Hyperplastic colonic polyp   . Diverticulosis of colon   . COPD (chronic obstructive pulmonary disease)   . Myasthenia gravis   . Arthritis, rheumatoid   . BPH (benign prostatic hypertrophy)   . Cirrhosis of liver   . Asthma   . Alcohol abuse   . Hiatal hernia   . Skin cancer of forehead     and left ear  . Lung nodule   . Nephrolithiasis   . CHF (congestive heart failure)   . Pacemaker   . Herpes zoster   . Hoarding disorder with poor insight   . Esophageal stricture    Past Surgical History:  Past Surgical History  Procedure Laterality Date  . Total knee arthroplasty      bilateral  . Total hip arthroplasty      x3  . Splenectomy    . Appendectomy    . Pacemaker insertion  2009    medtronic  . Cardiac catheterization  10/12/2007    EF 60%  . Inguinal hernia repair    . Esophagogastroduodenoscopy N/A 09/23/2014    Procedure: ESOPHAGOGASTRODUODENOSCOPY (EGD);  Surgeon: Ladene Artist, MD;  Location: [facility];  Service: Endoscopy;  Laterality: N/A;  . Savory dilation N/A 09/23/2014    Procedure: SAVORY DILATION;  Surgeon: Ladene Artist, MD;  Location: [facility];  Service: Endoscopy;  Laterality: N/A;  . Skin cancer excision     HPI:  Other Pertinent Information: 81 y.o. male with PMH of s/p of splenectomy, esophageal stricture (pt reports dilation "several months ago",  hiatal hernia, myasthenia gravis on Imuran, systolic and diastolic congestive heart failure  COPD, GERD, depression, anxiety, complete heart block, pacemaker placement, BPH, arthritis, alcohol abuse, liver cirrhosis, skin cancer, who presents with productive cough and shortness of breath. MD note reports daughter reports, patient chokes when he eats solid food sometimes. Chest x-ray showed a right basilar infiltration. Mild right basilar opacity is noted concerning for pneumonia or atelectasis with associated pleural effusion.  No Data Recorded  Assessment / Plan / Recommendation CHL IP CLINICAL IMPRESSIONS 06/14/2015  Therapy Diagnosis Mild pharyngeal phase dysphagia  Clinical Impression Mild motor based pharyngeal dysphagia and min-mild laryngeal penetration (silent) cleared with verbal cues for additional swallows and cough/throat clear. Mild vallecular and pyriform sinsus residue given motor weakness reduced with two swallows. Pt complaining of a "sore place" on right side of pharynx. SLP did not observe unusual presentation during FEES. Pt stated "my sore is lower than where your scope is, go further down." SLP explained FEES scope cannot go below vocal cords. Pt also had esophageal dilation several months ago for stricture and affirms possible cervical bone spurs or osteophytes all which may be causing referred sensation or pain in pharynx (?). Pt has Myasthenia Gravis that may contributing to swallow function/pt's sensation. Recommend continue Dys 3 texture (no dentition) and  thin liquids, pills with liquid, small straw sips allowed. Educated pt on strategies for double swallows and volitional throat clears/coughs. Would pt benefit from ENT or GI endoscopy to further address pt's odnophagia and rule out abnormality?      CHL IP TREATMENT RECOMMENDATION 06/14/2015  Treatment Recommendations Therapy as outlined in treatment plan below     CHL IP DIET RECOMMENDATION 06/14/2015  SLP Diet Recommendations  Dysphagia 3 (Mech soft);Thin  Liquid Administration via (None)  Medication Administration Whole meds with liquid  Compensations Slow rate;Small sips/bites;Multiple dry swallows after each bite/sip;Clear throat intermittently;Hard cough after swallow  Postural Changes and/or Swallow Maneuvers (None)     CHL IP OTHER RECOMMENDATIONS 06/14/2015  Recommended Consults Consider GI evaluation;Consider ENT evaluation  Oral Care Recommendations Oral care BID  Other Recommendations (None)     No flowsheet data found.   CHL IP FREQUENCY AND DURATION 06/14/2015  Speech Therapy Frequency (ACUTE ONLY) min 1 x/week  Treatment Duration 2 weeks     Pertinent Vitals/Pain none    SLP Swallow Goals No flowsheet data found.  No flowsheet data found.    CHL IP REASON FOR REFERRAL 06/14/2015  Reason for Referral Objectively evaluate swallowing function               No flowsheet data found.         Houston Siren 06/14/2015, 11:03 AM   Orbie Pyo Colvin Caroli.Ed [employer] [phone]

## 2015-06-14 NOTE — Progress Notes (Signed)
Patient ID: [name] [name], male   DOB: [birth year], 81 y.o.   MRN: [id number] [facility] PROGRESS NOTE  [name] [name] PYP:[id number] DOB: 1929-07-29 DOA: 06/12/2015 PCP: [name], MD   Brief narrative:    81 year old male with past medical history significant for chronic systolic and diastolic CHF (2-D echo on 06/13/2015 demonstrated ejection fraction of 40%), hypertension, BPH, atrial fibrillation status post pacemaker, not a candidate for anticoagulation because of risk of falls.   Patient presented to ED with transient loss of consciousness which has happened in the past. He also reported having transient atypical sharp chest pain. His troponin level was negative, BNP was mildly elevated at 461. The 12-lead EKG showed probable branch block with paced rhythm. Cardiology has seen the patient in consultation. In addition, patient was found to have right lower lobe pneumonia seen on chest x-ray. He was started on vanco and zosyn.   Assessment/Plan:    Principal Problem: Syncope - Patient presented with transient loss of consciousness which apparently he has experienced in past - Patient has pacemaker. Cardiology has seen him in consultation and they will interrogate pacemaker to see if there is any rhythm abnormalities - Order placed for physical therapy evaluation - recommendation for home health PT once patient stable for discharge  Active problems: Atypical chest pain / coronary artery disease - Troponin levels are within normal limits x 3 sets - Cardiology is following - Last 2-D echo on this admission shows ejection fraction of 40-45% - Continue daily aspirin  Chronic atrial fibrillation - CHADS vasc score at least 4 - Patient is status post permanent transvenous pacemaker insertion - Not a candidate for anticoagulation because of risk of falls  Chronic combined systolic and diastolic congestive heart failure - Patient has had 2-D echo in November 2014 which  demonstrated ejection fraction of 40-45% with grade 3 diastolic dysfunction. On this admission 2-D echo demonstrated same ejection fraction but diastolic function was not evaluated because patient was in atrial fibrillation - Appreciate cardiology following - Continue Lasix 40 mg daily, spironolactone 25 mg daily  Right lower lobe pneumonia - Chest x-ray on the admission demonstrated mild right basilar opacity concerning for pneumonia - Patient was started on broad-spectrum antibodies, vancomycin and Zosyn because he is immunosuppressed. Patient is on azathioprine for myasthenia gravis. - Respiratory status stable  Dysphagia - Patient reports intermittent episodes of difficulty swallowing with either liquids or solids. - SLP evaluation pending - He is currently on dysphagia 3 diet  Depression - Continue Lexapro  Myasthenia gravis - Continue azathioprine  Dyslipidemia - Continue omega-3 supplementation  BPH - Continue finasteride 5 mg daily   DVT Prophylaxis  - Lovenox subcutaneous ordered   Code Status: DNR/DNI Family Communication:  plan of care discussed with the patient Disposition Plan: home with HHPT once stable   IV access:  Peripheral IV  Procedures and diagnostic studies:    Dg Chest 2 View 06/12/2015   Mild right basilar opacity is noted concerning for pneumonia or atelectasis with associated pleural effusion.   Electronically Signed   By: Marijo Conception, M.D.   On: 06/12/2015 16:23   Ct Head Wo Contrast 06/13/2015   Atrophy with minimal small vessel chronic ischemic changes of deep cerebral white matter.  No acute intracranial abnormalities.   Electronically Signed   By: Lavonia Dana M.D.   On: 06/13/2015 13:25    Medical Consultants:  Cardiology, Dr. Quay Burow   Other Consultants:  Physical therapy SLP Nutrition  IAnti-Infectives:   None    [name], MD  [facility] Pager [phone]  Time spent in minutes: 25 minutes  If 7PM-7AM,  please contact night-coverage [email or web address] Password [email or web address] 06/14/2015, 10:50 AM   LOS: 2 days    HPI/Subjective: No acute overnight events. Patient reports domething to be "stuck in his throat".   Objective: Filed Vitals:   06/13/15 1405 06/13/15 1554 06/13/15 2207 06/14/15 0241  BP: 147/77 145/82 149/80 148/85  Pulse: 62  64 65  Temp: 98.6 F (37 C)  98.6 F (37 C) 98.5 F (36.9 C)  TempSrc: Oral  Oral Oral  Resp: 18  18 20   Weight: 69.355 kg (152 lb 14.4 oz)     SpO2: 100%  100% 96%    Intake/Output Summary (Last 24 hours) at 06/14/15 1050 Last data filed at 06/14/15 0911  Gross per 24 hour  Intake   1070 ml  Output   1520 ml  Net   -450 ml    Exam:   General:  Pt is alert, follows commands appropriately, not in acute distress  Cardiovascular: Regular rate and rhythm, S1/S2 appreciated   Respiratory: Clear to auscultation bilaterally, no wheezing, no crackles, no rhonchi  Abdomen: Soft, non tender, non distended, bowel sounds present  Extremities: No edema, pulses DP and PT palpable bilaterally  Neuro: Grossly nonfocal  Data Reviewed: Basic Metabolic Panel:  Recent Labs Lab 06/12/15 1553  NA 136  K 4.2  CL 99*  CO2 29  GLUCOSE 158*  BUN 16  CREATININE 0.61  CALCIUM 8.7*   Liver Function Tests: No results for input(s): AST, ALT, ALKPHOS, BILITOT, PROT, ALBUMIN in the last 168 hours. No results for input(s): LIPASE, AMYLASE in the last 168 hours. No results for input(s): AMMONIA in the last 168 hours. CBC:  Recent Labs Lab 06/12/15 1553  WBC 8.0  HGB 11.4*  HCT 34.4*  MCV 95.8  PLT 355   Cardiac Enzymes:  Recent Labs Lab 06/13/15 1154 06/13/15 1635 06/13/15 2235  TROPONINI <0.03 <0.03 <0.03   BNP: Invalid input(s): POCBNP CBG:  Recent Labs Lab 06/13/15 1134  GLUCAP 155*    Recent Results (from the past 240 hour(s))  Culture, respiratory (NON-Expectorated)     Status: None (Preliminary result)   Collection Time: 06/13/15   6:00 AM  Result Value Ref Range Status   Specimen Description SPUTUM  Final   Special Requests NONE  Final   Gram Stain   Final   Culture   Final    Culture reincubated for better growth Performed at [facility]    Report Status PENDING  Incomplete  Culture, sputum-assessment     Status: None   Collection Time: 06/13/15  6:12 AM  Result Value Ref Range Status   Specimen Description SPUTUM  Final   Special Requests Immunocompromised  Final   Sputum evaluation   Final    THIS SPECIMEN IS ACCEPTABLE. RESPIRATORY CULTURE REPORT TO FOLLOW.   Report Status 06/13/2015 FINAL  Final     Scheduled Meds: . aspirin EC  81 mg Oral Daily  . azaTHIOprine  50 mg Oral TID  . budesonide-formoterol  2 puff Inhalation BID  . dextromethorphan-guaiFENesin  1 tablet Oral BID  . enoxaparin (LOVENOX) injection  40 mg Subcutaneous Daily  . escitalopram  20 mg Oral Daily  . finasteride  5 mg Oral Daily  . furosemide  40 mg Oral Daily  . ipratropium-albuterol  3 mL Nebulization BID  . LORazepam  0-4 mg  Oral 4 times per day   Followed by  . [START ON 06/15/2015] LORazepam  0-4 mg Oral Q12H  . omega-3 acid ethyl esters  1 g Oral Daily  . pantoprazole  40 mg Oral Daily  . piperacillin-tazobactam (ZOSYN)  IV  3.375 g Intravenous Q8H  . silver sulfADIAZINE  1 application Topical Daily  . spironolactone  25 mg Oral Daily  . temazepam  15 mg Oral QHS  . thiamine  100 mg Oral Daily   Or  . thiamine  100 mg Intravenous Daily  . triamcinolone cream  1 application Topical Daily  . vancomycin  750 mg Intravenous Q12H

## 2015-06-14 NOTE — Progress Notes (Signed)
*  PRELIMINARY RESULTS* Vascular Ultrasound Lower extremity venous duplex has been completed.  Preliminary findings: negative for DVT  Landry Mellow, RDMS, RVT  06/14/2015, 11:06 AM

## 2015-06-15 DIAGNOSIS — R1310 Dysphagia, unspecified: Secondary | ICD-10-CM

## 2015-06-15 DIAGNOSIS — F329 Major depressive disorder, single episode, unspecified: Secondary | ICD-10-CM

## 2015-06-15 DIAGNOSIS — I10 Essential (primary) hypertension: Secondary | ICD-10-CM

## 2015-06-15 DIAGNOSIS — E43 Unspecified severe protein-calorie malnutrition: Secondary | ICD-10-CM | POA: Insufficient documentation

## 2015-06-15 LAB — VANCOMYCIN, TROUGH: Vancomycin Tr: 16 ug/mL (ref 10.0–20.0)

## 2015-06-15 MED ORDER — LEVOFLOXACIN 500 MG PO TABS: 500.0000 mg | ORAL_TABLET | Freq: Every day | ORAL | Status: DC

## 2015-06-15 MED ORDER — ENSURE ENLIVE PO LIQD: 237.0000 mL | Freq: Three times a day (TID) | ORAL | Status: DC

## 2015-06-15 MED ORDER — FUROSEMIDE 40 MG PO TABS: 40.0000 mg | ORAL_TABLET | Freq: Every day | ORAL | Status: DC

## 2015-06-15 NOTE — Progress Notes (Signed)
ANTIBIOTIC CONSULT NOTE - FOLLOW UP  Pharmacy Consult for Vanco/Zosyn Indication: pneumonia  Allergies  Allergen Reactions  . Celebrex [Celecoxib] Nausea And Vomiting and Other (See Comments)    Stomach ulcers and bleeding  . Alendronate Sodium Other (See Comments)    Unknown.  . Benazepril Other (See Comments)    Unknown.  . Remicade [Infliximab] Other (See Comments)    Patient Measurements: Weight: 147 lb (66.679 kg) Adjusted Body Weight:    Vital Signs: Temp: 98.1 F (36.7 C) (08/25 0548) Temp Source: Oral (08/25 0548) BP: 132/57 mmHg (08/25 0900) Pulse Rate: 60 (08/25 0900) Intake/Output from previous day: 08/24 0701 - 08/25 0700 In: 970 [P.O.:720; IV Piggyback:250] Out: 1700 [Urine:1700] Intake/Output from this shift: Total I/O In: -  Out: 150 [Urine:150]  Labs:  Recent Labs  06/12/15 1553  WBC 8.0  HGB 11.4*  PLT 355  CREATININE 0.61   Estimated Creatinine Clearance: 66 mL/min (by C-G formula based on Cr of 0.61).  Recent Labs  06/15/15 1135  [place] 16     Microbiology: Recent Results (from the past 720 hour(s))  Culture, blood (x 2)     Status: None (Preliminary result)   Collection Time: 06/13/15 12:35 AM  Result Value Ref Range Status   Specimen Description BLOOD LEFT ARM  Final   Special Requests BOTTLES DRAWN AEROBIC AND ANAEROBIC 5ML  Final   Culture NO GROWTH 1 DAY  Final   Report Status PENDING  Incomplete  Culture, blood (x 2)     Status: None (Preliminary result)   Collection Time: 06/13/15 12:45 AM  Result Value Ref Range Status   Specimen Description BLOOD LEFT FOREARM  Final   Special Requests BOTTLES DRAWN AEROBIC AND ANAEROBIC 5ML  Final   Culture NO GROWTH 1 DAY  Final   Report Status PENDING  Incomplete  Culture, respiratory (NON-Expectorated)     Status: None (Preliminary result)   Collection Time: 06/13/15  6:00 AM  Result Value Ref Range Status   Specimen Description SPUTUM  Final   Special Requests NONE  Final   Gram Stain   Final    FEW WBC PRESENT,BOTH PMN AND MONONUCLEAR RARE SQUAMOUS EPITHELIAL CELLS PRESENT FEW GRAM POSITIVE COCCI IN PAIRS RARE GRAM NEGATIVE RODS Performed at [facility]    Culture   Final    MODERATE STAPHYLOCOCCUS AUREUS Note: RIFAMPIN AND GENTAMICIN SHOULD NOT BE USED AS SINGLE DRUGS FOR TREATMENT OF STAPH INFECTIONS. Performed at [facility]    Report Status PENDING  Incomplete  Culture, sputum-assessment     Status: None   Collection Time: 06/13/15  6:12 AM  Result Value Ref Range Status   Specimen Description SPUTUM  Final   Special Requests Immunocompromised  Final   Sputum evaluation   Final    THIS SPECIMEN IS ACCEPTABLE. RESPIRATORY CULTURE REPORT TO FOLLOW.   Report Status 06/13/2015 FINAL  Final    Anti-infectives    Start     Dose/Rate Route Frequency Ordered Stop   06/15/15 0000  levofloxacin (LEVAQUIN) 500 MG tablet     500 mg Oral Daily 06/15/15 0928     06/13/15 1000  vancomycin (VANCOCIN) IVPB 750 mg/150 ml premix     750 mg 150 mL/hr over 60 Minutes Intravenous Every 12 hours 06/12/15 2208     06/13/15 0500  piperacillin-tazobactam (ZOSYN) IVPB 3.375 g     3.375 g 12.5 mL/hr over 240 Minutes Intravenous Every 8 hours 06/12/15 2214     06/12/15 2230  piperacillin-tazobactam (ZOSYN) IVPB 3.375 g  Status:  Discontinued     3.375 g 12.5 mL/hr over 240 Minutes Intravenous Every 8 hours 06/12/15 2213 06/12/15 2214   06/12/15 2230  piperacillin-tazobactam (ZOSYN) IVPB 3.375 g     3.375 g 100 mL/hr over 30 Minutes Intravenous  Once 06/12/15 2214 06/13/15 0129   06/12/15 2200  vancomycin (VANCOCIN) IVPB 1000 mg/200 mL premix     1,000 mg 200 mL/hr over 60 Minutes Intravenous  Once 06/12/15 2156 06/13/15 0309   06/12/15 2100  cefTRIAXone (ROCEPHIN) 1 g in dextrose 5 % 50 mL IVPB     1 g 100 mL/hr over 30 Minutes Intravenous  Once 06/12/15 2053 06/12/15 2313   06/12/15 2100  azithromycin (ZITHROMAX) 500 mg in dextrose 5 % 250 mL  IVPB  Status:  Discontinued     500 mg 250 mL/hr over 60 Minutes Intravenous  Once 06/12/15 2053 06/12/15 2313      Assessment: ID: V/Z for CAP - on V/Z due to being on immuospressive tx, (on immuran for MG) to have swallow eval to r/o aspiration; AF, WBC WNL, creat OK  Azith 8/22 x1 Rocephin 8/22 x1 Vanc 8/23 >> --8/25 VT=16 ok Zosyn 8/23 >>  8/23 sputum >> Staph aureus 8/23 BCx2 >> pend   Goal of Therapy:  Vancomycin trough level 15-20 mcg/ml  Plan:  Vanc 750mg  IV Q12h Zosyn 3.375 Q8h Plan d/c on Levaquin  Naveah Brave S. Alford Highland, PharmD, BCPS Clinical Staff Pharmacist Pager [phone]  Eilene Ghazi Stillinger 06/15/2015,1:31 PM

## 2015-06-15 NOTE — Discharge Instructions (Signed)
Pneumonia Pneumonia is an infection of the lungs.  CAUSES Pneumonia may be caused by bacteria or a virus. Usually, these infections are caused by breathing infectious particles into the lungs (respiratory tract). SIGNS AND SYMPTOMS   Cough.  Fever.  Chest pain.  Increased rate of breathing.  Wheezing.  Mucus production. DIAGNOSIS  If you have the common symptoms of pneumonia, your health care provider will typically confirm the diagnosis with a chest X-ray. The X-ray will show an abnormality in the lung (pulmonary infiltrate) if you have pneumonia. Other tests of your blood, urine, or sputum may be done to find the specific cause of your pneumonia. Your health care provider may also do tests (blood gases or pulse oximetry) to see how well your lungs are working. TREATMENT  Some forms of pneumonia may be spread to other people when you cough or sneeze. You may be asked to wear a mask before and during your exam. Pneumonia that is caused by bacteria is treated with antibiotic medicine. Pneumonia that is caused by the influenza virus may be treated with an antiviral medicine. Most other viral infections must run their course. These infections will not respond to antibiotics.  HOME CARE INSTRUCTIONS   Cough suppressants may be used if you are losing too much rest. However, coughing protects you by clearing your lungs. You should avoid using cough suppressants if you can.  Your health care provider may have prescribed medicine if he or she thinks your pneumonia is caused by bacteria or influenza. Finish your medicine even if you start to feel better.  Your health care provider may also prescribe an expectorant. This loosens the mucus to be coughed up.  Take medicines only as directed by your health care provider.  Do not smoke. Smoking is a common cause of bronchitis and can contribute to pneumonia. If you are a smoker and continue to smoke, your cough may last several weeks after your  pneumonia has cleared.  A cold steam vaporizer or humidifier in your room or home may help loosen mucus.  Coughing is often worse at night. Sleeping in a semi-upright position in a recliner or using a couple pillows under your head will help with this.  Get rest as you feel it is needed. Your body will usually let you know when you need to rest. PREVENTION A pneumococcal shot (vaccine) is available to prevent a common bacterial cause of pneumonia. This is usually suggested for:  People over 65 years old.  Patients on chemotherapy.  People with chronic lung problems, such as bronchitis or emphysema.  People with immune system problems. If you are over 65 or have a high risk condition, you may receive the pneumococcal vaccine if you have not received it before. In some countries, a routine influenza vaccine is also recommended. This vaccine can help prevent some cases of pneumonia.You may be offered the influenza vaccine as part of your care. If you smoke, it is time to quit. You may receive instructions on how to stop smoking. Your health care provider can provide medicines and counseling to help you quit. SEEK MEDICAL CARE IF: You have a fever. SEEK IMMEDIATE MEDICAL CARE IF:   Your illness becomes worse. This is especially true if you are elderly or weakened from any other disease.  You cannot control your cough with suppressants and are losing sleep.  You begin coughing up blood.  You develop pain which is getting worse or is uncontrolled with medicines.  Any of the symptoms   which initially brought you in for treatment are getting worse rather than better.  You develop shortness of breath or chest pain. MAKE SURE YOU:   Understand these instructions.  Will watch your condition.  Will get help right away if you are not doing well or get worse. Document Released: 10/07/2005 Document Revised: 02/21/2014 Document Reviewed: 12/27/2010 [facility] Patient Information 2015  [place], [place]. This information is not intended to replace advice given to you by your health care provider. Make sure you discuss any questions you have with your health care provider.  [facility] Proper nutrition can help your body recover from illness and injury.   Foods and beverages high in protein, vitamins, and minerals help rebuild muscle loss, promote healing, & reduce fall risk.   In addition to eating healthy foods, a nutrition shake is an easy, delicious way to get the nutrition you need during and after your hospital stay  It is recommended that you continue to drink 2 bottles per day of:      Ensure Plus for at least 1 month (30 days) after your hospital stay   Tips for adding a nutrition shake into your routine: As allowed, drink one with vitamins or medications instead of water or juice Enjoy one as a tasty mid-morning or afternoon snack Drink cold or make a milkshake out of it Drink one instead of milk with cereal or snacks Use as a coffee creamer   Available at the following grocery stores and pharmacies:           * [place] [phone]            For COUPONS visit: www.ensure.com/join or http://dawson-may.com/   Suggested Substitutions Ensure Plus = Boost Plus = Carnation Breakfast Essentials = Boost Compact Ensure Active Clear = Boost Breeze

## 2015-06-15 NOTE — Discharge Summary (Signed)
Physician Discharge Summary  [name] [name] HBZ:[id number] DOB: 08-15-1929 DOA: 06/12/2015  PCP: [name] Ly, MD  Admit date: 06/12/2015 Discharge date: 06/15/2015  Recommendations for Outpatient Follow-up:  Continue lasix 40 mg daily. Please have your PCP refer you to ENT for evaluation of swallowing function. Take Levaquin for 4 more days on discharge for pneumonia   Discharge Diagnoses:  Principal Problem:   CAP (community acquired pneumonia): Probable Active Problems:   Anxiety state   Depression   GERD   Myasthenia gravis   Essential hypertension, benign   Complete heart block   CAD (coronary artery disease)   Dyspnea   Chronic combined systolic and diastolic congestive heart failure   Alcohol abuse   COPD (chronic obstructive pulmonary disease)   Hoarding disorder with poor insight    Discharge Condition: stable   Diet recommendation: as tolerated   History of present illness:  81 year old male with past medical history significant for chronic systolic and diastolic CHF (2-D echo on 06/13/2015 demonstrated ejection fraction of 40%), hypertension, BPH, atrial fibrillation status post pacemaker, not a candidate for anticoagulation because of risk of falls.   Patient presented to ED with transient loss of consciousness which has happened in the past. He also reported having transient atypical sharp chest pain. His troponin level was negative, BNP was mildly elevated at 461. The 12-lead EKG showed probable branch block with paced rhythm. Cardiology has seen the patient in consultation. In addition, patient was found to have right lower lobe pneumonia seen on chest x-ray. He was started on vanco and zosyn.  Hospital Course:    Assessment/Plan:    Principal Problem: Syncope - Patient presented with transient loss of consciousness which apparently he experienced in past - Patient has pacemaker. Cardiology has seen him in consultation, no further recommendations -  HH orders in place   Active problems: Atypical chest pain / coronary artery disease - Troponin levels are within normal limits x 3 sets - Cardiology has seen the pt in consultation  - 2-D echo on this admission shows ejection fraction of 40-45% - Continue daily aspirin  Chronic atrial fibrillation - CHADS vasc score at least 4 - Patient is status post permanent transvenous pacemaker insertion - Not a candidate for anticoagulation because of risk of falls  Chronic combined systolic and diastolic congestive heart failure - Patient has had 2-D echo in November 2014 which demonstrated ejection fraction of 40-45% with grade 3 diastolic dysfunction. On this admission 2-D echo demonstrated same ejection fraction but diastolic function was not evaluated because patient was in atrial fibrillation - Continue Lasix 40 mg daily, spironolactone 25 mg daily  Right lower lobe pneumonia - Chest x-ray on the admission demonstrated mild right basilar opacity concerning for pneumonia - Patient was started on broad-spectrum antibodies, vancomycin and Zosyn because he is immunosuppressed. Patient is on azathioprine for myasthenia gravis. - Respiratory status stable  Dysphagia - Patient reports intermittent episodes of difficulty swallowing with either liquids or solids. I think this is most likely related to MG. He has tolerated all the solid food so far with no difficulty - Pt told to have referral by PCP with ENT  - He is currently on dysphagia 3 diet  Depression - Continue Lexapro  Myasthenia gravis - Continue azathioprine  Dyslipidemia - Continue omega-3 supplementation  BPH - Continue finasteride 5 mg daily   DVT Prophylaxis  - Lovenox subcutaneous ordered while pt in hospital    Code Status: DNR/DNI Family Communication: plan of care discussed  with the patient   IV access:  Peripheral IV  Procedures and diagnostic studies:   Dg Chest 2 View 06/12/2015 Mild right  basilar opacity is noted concerning for pneumonia or atelectasis with associated pleural effusion. Electronically Signed By: Marijo Conception, M.D. On: 06/12/2015 16:23   Ct Head Wo Contrast 06/13/2015 Atrophy with minimal small vessel chronic ischemic changes of deep cerebral white matter. No acute intracranial abnormalities. Electronically Signed By: Lavonia Dana M.D. On: 06/13/2015 13:25    Medical Consultants:  Cardiology, Dr. Quay Burow   Other Consultants:  Physical therapy SLP Nutrition   IAnti-Infectives:   None    Signed:  [name], MD  [facility] 06/15/2015, 9:30 AM  Pager #: [phone]  Time spent in minutes: more than 30 minutes   Discharge Exam: Filed Vitals:   06/15/15 0548  BP: 131/69  Pulse: 60  Temp: 98.1 F (36.7 C)  Resp: 18   Filed Vitals:   06/14/15 2030 06/14/15 2053 06/15/15 0548 06/15/15 0828  BP: 136/69  131/69   Pulse: 66 64 60   Temp: 98 F (36.7 C)  98.1 F (36.7 C)   TempSrc: Oral  Oral   Resp: 20 18 18    Weight:   66.679 kg (147 lb)   SpO2: 100% 96% 97% 92%    General: Pt is alert, follows commands appropriately, not in acute distress Cardiovascular: Regular rate and rhythm, S1/S2 appreciated  Respiratory: Clear to auscultation bilaterally, no wheezing, no crackles, no rhonchi Abdominal: Soft, non tender, non distended, bowel sounds +, no guarding Extremities: no edema, no cyanosis, pulses palpable bilaterally DP and PT Neuro: Grossly nonfocal  Discharge Instructions  Discharge Instructions    Call MD for:  difficulty breathing, headache or visual disturbances    Complete by:  As directed      Call MD for:  persistant nausea and vomiting    Complete by:  As directed      Call MD for:  severe uncontrolled pain    Complete by:  As directed      Diet - low sodium heart healthy    Complete by:  As directed      Discharge instructions    Complete by:  As directed   Continue lasix 40 mg  daily. Please have your PCP refer you to ENT for evaluation of swallowing function. Take Levaquin for 4 more days on discharge for pneumonia     Increase activity slowly    Complete by:  As directed             Medication List    STOP taking these medications        lidocaine 2 % solution  Commonly known as:  XYLOCAINE      TAKE these medications        aspirin EC 81 MG tablet  Take 81 mg by mouth daily.     augmented betamethasone dipropionate 0.05 % ointment  Commonly known as:  DIPROLENE-AF  Apply 1 application topically daily.     azaTHIOprine 50 MG tablet  Commonly known as:  IMURAN  Take 50 mg by mouth 3 (three) times daily.     budesonide-formoterol 160-4.5 MCG/ACT inhaler  Commonly known as:  SYMBICORT  Inhale 2 puffs into the lungs 2 (two) times daily.     docusate sodium 100 MG capsule  Commonly known as:  COLACE  Take 100 mg by mouth 2 (two) times daily as needed for constipation.     escitalopram 20  MG tablet  Commonly known as:  LEXAPRO  Take 20 mg by mouth daily.     esomeprazole 40 MG capsule  Commonly known as:  NEXIUM  Take 40 mg by mouth 2 (two) times daily before a meal.     FIBER PO  Take 1 tablet by mouth 2 (two) times daily with a meal.     finasteride 5 MG tablet  Commonly known as:  PROSCAR  Take 5 mg by mouth daily.     Fish Oil 1000 MG Caps  Take 1 capsule by mouth daily.     furosemide 40 MG tablet  Commonly known as:  LASIX  Take 1 tablet (40 mg total) by mouth daily.     KLOR-CON M20 20 MEQ tablet  Generic drug:  potassium chloride SA  Take 20 mEq by mouth daily.     levofloxacin 500 MG tablet  Commonly known as:  LEVAQUIN  Take 1 tablet (500 mg total) by mouth daily.     morphine 15 MG tablet  Commonly known as:  MSIR  Take 15 mg by mouth 2 (two) times daily as needed (for breakthrough pain).     nystatin 100000 UNIT/ML suspension  Commonly known as:  MYCOSTATIN  Take 5 mLs by mouth 4 (four) times daily.      spironolactone 50 MG tablet  Commonly known as:  ALDACTONE  Take 25 mg by mouth daily.     temazepam 30 MG capsule  Commonly known as:  RESTORIL  Take 30 mg by mouth at bedtime.           Follow-up Information    Follow up with PERINI,MARK A, MD. Schedule an appointment as soon as possible for a visit in 1 week.   Specialty:  Internal Medicine   Why:  Follow up appt after recent hospitalization   Contact information:   [address] [place] [address] [phone]        The results of significant diagnostics from this hospitalization (including imaging, microbiology, ancillary and laboratory) are listed below for reference.    Significant Diagnostic Studies: Dg Chest 2 View  06/12/2015   CLINICAL DATA:  Productive cough 1 month.  EXAM: CHEST  2 VIEW  COMPARISON:  November 25, 2014.  FINDINGS: Stable cardiomediastinal silhouette. Left-sided pacemaker is unchanged in position. No pneumothorax is noted. Anterior osteophyte formation is noted in lower thoracic spine. Left lung is clear. Right basilar opacity is noted which is slightly decreased compared to prior exam, most consistent with pneumonia or atelectasis with associated pleural effusion. Stable calcified granuloma is noted in right midlung.  IMPRESSION: Mild right basilar opacity is noted concerning for pneumonia or atelectasis with associated pleural effusion.   Electronically Signed   By: Marijo Conception, M.D.   On: 06/12/2015 16:23   Ct Head Wo Contrast  06/13/2015   CLINICAL DATA:  Syncope today, sinus pressure, headache, history hypertension, COPD, myasthenia gravis, cirrhosis, asthma, CHF, coronary artery disease, atrial fibrillation, former smoker  EXAM: CT HEAD WITHOUT CONTRAST  TECHNIQUE: Contiguous axial images were obtained from the base of the skull through the vertex without intravenous contrast.  COMPARISON:  11/25/2014  FINDINGS: Generalized atrophy.  Normal ventricular morphology.  No midline shift or mass  effect.  Scattered mild small vessel chronic ischemic changes of deep cerebral white matter.  No intracranial hemorrhage, mass lesion or evidence acute infarction.  No extra-axial fluid collections.  Scattered atherosclerotic calcifications at BILATERAL carotid siphons and vertebral arteries.  Paranasal  sinuses and mastoid air cells clear.  No acute osseous findings.  IMPRESSION: Atrophy with minimal small vessel chronic ischemic changes of deep cerebral white matter.  No acute intracranial abnormalities.   Electronically Signed   By: Lavonia Dana M.D.   On: 06/13/2015 13:25    Microbiology: Recent Results (from the past 240 hour(s))  Culture, blood (x 2)     Status: None (Preliminary result)   Collection Time: 06/13/15 12:35 AM  Result Value Ref Range Status   Specimen Description BLOOD LEFT ARM  Final   Special Requests BOTTLES DRAWN AEROBIC AND ANAEROBIC 5ML  Final   Culture NO GROWTH 1 DAY  Final   Report Status PENDING  Incomplete  Culture, blood (x 2)     Status: None (Preliminary result)   Collection Time: 06/13/15 12:45 AM  Result Value Ref Range Status   Specimen Description BLOOD LEFT FOREARM  Final   Special Requests BOTTLES DRAWN AEROBIC AND ANAEROBIC 5ML  Final   Culture NO GROWTH 1 DAY  Final   Report Status PENDING  Incomplete  Culture, respiratory (NON-Expectorated)     Status: None (Preliminary result)   Collection Time: 06/13/15  6:00 AM  Result Value Ref Range Status   Specimen Description SPUTUM  Final   Special Requests NONE  Final   Gram Stain   Final    FEW WBC PRESENT,BOTH PMN AND MONONUCLEAR RARE SQUAMOUS EPITHELIAL CELLS PRESENT FEW GRAM POSITIVE COCCI IN PAIRS RARE GRAM NEGATIVE RODS Performed at [facility]    Culture   Final    MODERATE STAPHYLOCOCCUS AUREUS Note: RIFAMPIN AND GENTAMICIN SHOULD NOT BE USED AS SINGLE DRUGS FOR TREATMENT OF STAPH INFECTIONS. Performed at [facility]    Report Status PENDING  Incomplete  Culture,  sputum-assessment     Status: None   Collection Time: 06/13/15  6:12 AM  Result Value Ref Range Status   Specimen Description SPUTUM  Final   Special Requests Immunocompromised  Final   Sputum evaluation   Final    THIS SPECIMEN IS ACCEPTABLE. RESPIRATORY CULTURE REPORT TO FOLLOW.   Report Status 06/13/2015 FINAL  Final     Labs: Basic Metabolic Panel:  Recent Labs Lab 06/12/15 1553  NA 136  K 4.2  CL 99*  CO2 29  GLUCOSE 158*  BUN 16  CREATININE 0.61  CALCIUM 8.7*   Liver Function Tests: No results for input(s): AST, ALT, ALKPHOS, BILITOT, PROT, ALBUMIN in the last 168 hours. No results for input(s): LIPASE, AMYLASE in the last 168 hours. No results for input(s): AMMONIA in the last 168 hours. CBC:  Recent Labs Lab 06/12/15 1553  WBC 8.0  HGB 11.4*  HCT 34.4*  MCV 95.8  PLT 355   Cardiac Enzymes:  Recent Labs Lab 06/13/15 1154 06/13/15 1635 06/13/15 2235  TROPONINI <0.03 <0.03 <0.03   BNP: BNP (last 3 results)  Recent Labs  11/25/14 1732 06/13/15 0318  BNP 660.0* 461.2*    ProBNP (last 3 results) No results for input(s): PROBNP in the last 8760 hours.  CBG:  Recent Labs Lab 06/13/15 1134  GLUCAP 155*

## 2015-06-15 NOTE — Progress Notes (Signed)
Physical Therapy Treatment Patient Details Name: [name] [name] MRN: [id number] DOB: 1929/08/09 Today's Date: 06/15/2015    History of Present Illness HPI: [name] [name] is a 81 y.o. male with PMH of s/p of splenectomy, MG on Imuran, combined systolic and diastolic congestive heart failure (EF of 40-45% with grade 3 diastolic dysfunction), COPD, GERD, depression, anxiety, complete heart block, pacemaker placement, BPH, arthritis, alcohol abuse, liver cirrhosis, skin cancer, history of esophageal stricture, who presents with productive cough and shortness of breath.    PT Comments    Patient remains impulsive and unsafe, but did agree to use walker this session with improved stability.  Though we have recommended HHPT he states he refused it.  Feel patient highly at risk for falls at home due to cognitive and/or behavioral issues.  Have also recommended HHSW consult which would be beneficial to look at home environment and assist with decreasing chances of readmission within short time frame.  Follow Up Recommendations  Home health PT;Other (comment)     Equipment Recommendations  None recommended by PT    Recommendations for Other Services       Precautions / Restrictions Precautions Precautions: Fall Precaution Comments: refusing walker use    Mobility  Bed Mobility Overal bed mobility: Modified Independent             General bed mobility comments: up in chair  Transfers Overall transfer level: Needs assistance Equipment used: None Transfers: Sit to/from Stand Sit to Stand: Supervision         General transfer comment: due to unsafe techniques supervision for safety  Ambulation/Gait Ambulation/Gait assistance: Supervision;Min guard Ambulation Distance (Feet): 300 Feet Assistive device: Rolling walker (2 wheeled) Gait Pattern/deviations: Step-through pattern;Decreased stride length     General Gait Details: agreed to use walker today and demonstrated safety  with supervision, assist only for modesty   Stairs            Wheelchair Mobility    Modified Rankin (Stroke Patients Only)       Balance             Standing balance-Leahy Scale: Fair                      Cognition Arousal/Alertness: Awake/alert Behavior During Therapy: Impulsive Overall Cognitive Status: History of cognitive impairments - at baseline                      Exercises      General Comments        Pertinent Vitals/Pain Pain Assessment: Faces Faces Pain Scale: Hurts little more Pain Location: achy all over due to RA Pain Descriptors / Indicators: Aching Pain Intervention(s): Monitored during session    Home Living                      Prior Function            PT Goals (current goals can now be found in the care plan section) Progress towards PT goals: Progressing toward goals    Frequency  Min 3X/week    PT Plan Current plan remains appropriate    Co-evaluation             End of Session   Activity Tolerance: Patient tolerated treatment well Patient left: in chair;with call bell/phone within reach;with chair alarm set;with family/visitor present     Time: 1400-1421 PT Time Calculation (min) (ACUTE ONLY): 21 min  Charges:  $  Gait Training: 8-22 mins                    G CodesMagda Kiel 2015/06/16, 3:21 PM  [place], [place] 2015-06-16

## 2015-06-15 NOTE — Care Management Important Message (Signed)
Important Message  Patient Details  Name: JACADEN FORBUSH MRN: [id number] Date of Birth: 1929/10/02   Medicare Important Message Given:  Yes-second notification given    Pricilla Handler 06/15/2015, 12:05 PM

## 2015-06-15 NOTE — Progress Notes (Signed)
Occupational Therapy Treatment Patient Details Name: DASH CARDARELLI MRN: [id number] DOB: 08/05/1929 Today's Date: 06/15/2015    History of present illness HPI: KHING BELCHER is a 81 y.o. male with PMH of s/p of splenectomy, MG on Imuran, combined systolic and diastolic congestive heart failure (EF of 40-45% with grade 3 diastolic dysfunction), COPD, GERD, depression, anxiety, complete heart block, pacemaker placement, BPH, arthritis, alcohol abuse, liver cirrhosis, skin cancer, history of esophageal stricture, who presents with productive cough and shortness of breath.   OT comments  Pt anxious about pain in his throat, fears he has esophageal cancer like his father.  Pt with urgency of bowel and urine, assisted to clean up and change gown.  Provided supervision with mobility due to pt declining walker and decreased safety awareness.   Follow Up Recommendations  Home health OT    Equipment Recommendations  None recommended by OT    Recommendations for Other Services      Precautions / Restrictions Precautions Precautions: Fall Precaution Comments: refusing walker use       Mobility Bed Mobility Overal bed mobility: Modified Independent                Transfers Overall transfer level: Needs assistance Equipment used: None Transfers: Sit to/from Stand Sit to Stand: Supervision         General transfer comment: impulsive when he began having BM while in standing    Balance             Standing balance-Leahy Scale: Fair                     ADL Overall ADL's : Needs assistance/impaired Eating/Feeding: Independent   Grooming: Wash/dry hands;Supervision/safety;Standing           Upper Body Dressing : Set up;Sitting       Toilet Transfer: Supervision/safety;Ambulation;BSC Toilet Transfer Details (indicate cue type and reason): pt with urinary and bowel urgency, pt says this is frequent Toileting- Clothing Manipulation and Hygiene:  Supervision/safety;Sit to/from stand       Functional mobility during ADLs: Supervision/safety General ADL Comments: pt declined use of RW, resistant to being assisted with ambulation      Vision                     Perception     Praxis      Cognition   Behavior During Therapy: Anxious Overall Cognitive Status: History of cognitive impairments - at baseline (pt with poor safety awareness and anxiety at baseline)                       Extremity/Trunk Assessment               Exercises     Shoulder Instructions       General Comments      Pertinent Vitals/ Pain       Pain Assessment: Faces Faces Pain Scale: Hurts a little bit Pain Location: throat Pain Descriptors / Indicators: Sore Pain Intervention(s): Monitored during session  Home Living                                          Prior Functioning/Environment              Frequency Min 2X/week     Progress Toward Goals  OT Goals(current goals  can now be found in the care plan section)  Progress towards OT goals: Progressing toward goals  Acute Rehab OT Goals Time For Goal Achievement: 06/20/15 Potential to Achieve Goals: Good  Plan Discharge plan remains appropriate    Co-evaluation                 End of Session     Activity Tolerance Patient tolerated treatment well   Patient Left in chair;with call bell/phone within reach;with chair alarm set;with family/visitor present   Nurse Communication  (pt wants throat assessed before discharging)        Time: 5217-4715 OT Time Calculation (min): 35 min  Charges: OT General Charges $OT Visit: 1 Procedure OT Treatments $Self Care/Home Management : 23-37 mins  Malka So 06/15/2015, 12:58 PM  [phone]

## 2015-06-15 NOTE — Progress Notes (Addendum)
Speech Language Pathology Treatment: Dysphagia  Patient Details Name: [name] [name] MRN: [id number] DOB: 07/08/1929 Today's Date: 06/15/2015 Time: 9892-1194 SLP Time Calculation (min) (ACUTE ONLY): 18 min  Assessment / Plan / Recommendation Clinical Impression  Dysphagia intervention following FEES assessment yesterday. Pt vocalized frustration and unhappiness with likely discharge today and would like to be seen by ENT prior to discharge (notified RN). SLP (again) examined oral cavity (soft palate, faucial pillars posterior pharynx. Pt states "I can see the edge of it when I looked in the mirror." SLP observes mildly red tissue bilaterally on faucial pillars and does not appear "ulcerated" as pt reports (?). Pt also stated his pain radiates up through side of face and head which this SLP had not heard before. Observation with straw sips thin was WFL's. Reviewed swallow strategies such as swallow twice and volitional swallow. Continue Dys 3 and thin liquids and follow up with ENT when able. Discharge ST at this time.   HPI Other Pertinent Information: 81 y.o. male with PMH of s/p of splenectomy, esophageal stricture (pt reports dilation "several months ago", hiatal hernia, myasthenia gravis on Imuran, systolic and diastolic congestive heart failure  COPD, GERD, depression, anxiety, complete heart block, pacemaker placement, BPH, arthritis, alcohol abuse, liver cirrhosis, skin cancer, who presents with productive cough and shortness of breath. MD note reports daughter reports, patient chokes when he eats solid food sometimes. Chest x-ray showed a right basilar infiltration. Mild right basilar opacity is noted concerning for pneumonia or atelectasis with associated pleural effusion.   Pertinent Vitals Pain Assessment:  (pt reports chronic pain in pharynx)  SLP Plan  Discharge SLP treatment due to (comment)    Recommendations Diet recommendations: Thin liquid;Dysphagia 3 (mechanical soft) Liquids  provided via: Cup;Straw Medication Administration: Whole meds with liquid Supervision: Patient able to self feed Compensations: Slow rate;Small sips/bites;Multiple dry swallows after each bite/sip;Clear throat intermittently;Hard cough after swallow Postural Changes and/or Swallow Maneuvers: Seated upright 90 degrees              Oral Care Recommendations: Oral care BID Follow up Recommendations:  (ENT consult) Plan: Discharge SLP treatment due to (comment)    GO     Houston Siren 06/15/2015, 11:40 AM  Orbie Pyo Colvin Caroli.Ed [employer] [phone]

## 2015-06-15 NOTE — Progress Notes (Signed)
  I spoke with Dr. Redmond Baseman of ENT who recommends outpatient follow-up. Patient tolerated solid food so evaluation can be done on outpatient basis.  [name]

## 2015-06-15 NOTE — Progress Notes (Signed)
Initial Nutrition Assessment  DOCUMENTATION CODES:   Non-severe (moderate) malnutrition in context of chronic illness  INTERVENTION:  Provide Ensure Enlive po TID, each supplement provides 350 kcal and 20 grams of protein  NUTRITION DIAGNOSIS:   Malnutrition related to social / environmental circumstances as evidenced by severe depletion of muscle mass, moderate depletion of body fat.   GOAL:   Patient will meet greater than or equal to 90% of their needs   MONITOR:   PO intake, Labs, Weight trends, Skin, I & O's  REASON FOR ASSESSMENT:   Consult Diet education  ASSESSMENT:   81 y.o. male with PMH of s/p of splenectomy, MG on Imuran, combined systolic and diastolic congestive heart failure (EF of 40-45% with grade 3 diastolic dysfunction), COPD, GERD, depression, anxiety, complete heart block, pacemaker placement, BPH, arthritis, alcohol abuse, liver cirrhosis, skin cancer, history of esophageal stricture, who presents with productive cough and shortness of breath.  RD consulted for diet education; no known diet needs per RN. Per RN pt has been complaining of throat pain but, is eating and swallowing well. Pt states that his appetite is average and he usually eats 3 meals per day and drinks Ensure/Boost 3-4 times per week. He reports losing 5 lbs recently, reports usual body weight of 155 lbs with highest weight of 165 lbs in the past year. RD offered nutrition education but, pt declined. He reports not eating well at some meals due to disliking the foods on his meal tray. RD performed nutrition-focused physical exam- pt has signs of severe muscle wasting and moderate fat wasting, meeting nutrition criteria for malnutrition. RD encouraged drinking Ensure after meals if PO intake is poor; pt agreeable. Coupons for Boost supplements provided.  Labs reviewed.   Diet Order:  DIET DYS 3 Room service appropriate?: Yes; Fluid consistency:: Thin Diet - low sodium heart healthy  Skin:   Reviewed, no issues  Last BM:  8/24  Height:   Ht Readings from Last 1 Encounters:  05/24/15 5' 7.32" (1.71 m)    Weight:   Wt Readings from Last 1 Encounters:  06/15/15 147 lb (66.679 kg)    Ideal Body Weight:  67.3 kg  BMI:  Body mass index is 22.8 kg/(m^2).  Estimated Nutritional Needs:   Kcal:  1700-2000  Protein:  90-105 grams  Fluid:  >/=1.7 L/day  EDUCATION NEEDS:   No education needs identified at this time  [place], LDN Inpatient Clinical Dietitian Pager: [phone] After Hours Pager: [phone]

## 2015-06-16 ENCOUNTER — Encounter: Payer: Self-pay | Admitting: *Deleted

## 2015-06-16 LAB — CULTURE, RESPIRATORY

## 2015-06-16 LAB — CULTURE, RESPIRATORY W GRAM STAIN

## 2015-06-18 LAB — CULTURE, BLOOD (ROUTINE X 2)
CULTURE: NO GROWTH
CULTURE: NO GROWTH

## 2015-07-04 ENCOUNTER — Other Ambulatory Visit: Payer: Self-pay | Admitting: Otolaryngology

## 2015-07-04 DIAGNOSIS — R070 Pain in throat: Secondary | ICD-10-CM

## 2015-07-05 ENCOUNTER — Emergency Department (HOSPITAL_COMMUNITY)
Admission: EM | Admit: 2015-07-05 | Discharge: 2015-07-05 | Disposition: A | Discharge status: Home / Self Care | Payer: Medicare Other | Attending: Emergency Medicine | Admitting: Emergency Medicine

## 2015-07-05 ENCOUNTER — Encounter (HOSPITAL_COMMUNITY): Payer: Self-pay | Admitting: Neurology

## 2015-07-05 ENCOUNTER — Emergency Department (HOSPITAL_COMMUNITY): Payer: Medicare Other

## 2015-07-05 DIAGNOSIS — F329 Major depressive disorder, single episode, unspecified: Secondary | ICD-10-CM | POA: Insufficient documentation

## 2015-07-05 DIAGNOSIS — Z85828 Personal history of other malignant neoplasm of skin: Secondary | ICD-10-CM | POA: Diagnosis not present

## 2015-07-05 DIAGNOSIS — Z7982 Long term (current) use of aspirin: Secondary | ICD-10-CM | POA: Diagnosis not present

## 2015-07-05 DIAGNOSIS — Z87442 Personal history of urinary calculi: Secondary | ICD-10-CM | POA: Diagnosis not present

## 2015-07-05 DIAGNOSIS — R0602 Shortness of breath: Secondary | ICD-10-CM | POA: Diagnosis present

## 2015-07-05 DIAGNOSIS — F1110 Opioid abuse, uncomplicated: Secondary | ICD-10-CM | POA: Insufficient documentation

## 2015-07-05 DIAGNOSIS — E785 Hyperlipidemia, unspecified: Secondary | ICD-10-CM | POA: Diagnosis not present

## 2015-07-05 DIAGNOSIS — Z950 Presence of cardiac pacemaker: Secondary | ICD-10-CM | POA: Insufficient documentation

## 2015-07-05 DIAGNOSIS — Z86010 Personal history of colonic polyps: Secondary | ICD-10-CM | POA: Insufficient documentation

## 2015-07-05 DIAGNOSIS — J441 Chronic obstructive pulmonary disease with (acute) exacerbation: Secondary | ICD-10-CM | POA: Diagnosis not present

## 2015-07-05 DIAGNOSIS — F1310 Sedative, hypnotic or anxiolytic abuse, uncomplicated: Secondary | ICD-10-CM | POA: Insufficient documentation

## 2015-07-05 DIAGNOSIS — I4891 Unspecified atrial fibrillation: Secondary | ICD-10-CM | POA: Insufficient documentation

## 2015-07-05 DIAGNOSIS — N400 Enlarged prostate without lower urinary tract symptoms: Secondary | ICD-10-CM | POA: Diagnosis not present

## 2015-07-05 DIAGNOSIS — M069 Rheumatoid arthritis, unspecified: Secondary | ICD-10-CM | POA: Diagnosis not present

## 2015-07-05 DIAGNOSIS — Z7951 Long term (current) use of inhaled steroids: Secondary | ICD-10-CM | POA: Insufficient documentation

## 2015-07-05 DIAGNOSIS — Z8619 Personal history of other infectious and parasitic diseases: Secondary | ICD-10-CM | POA: Diagnosis not present

## 2015-07-05 DIAGNOSIS — R1310 Dysphagia, unspecified: Secondary | ICD-10-CM | POA: Insufficient documentation

## 2015-07-05 DIAGNOSIS — I10 Essential (primary) hypertension: Secondary | ICD-10-CM | POA: Diagnosis not present

## 2015-07-05 DIAGNOSIS — K219 Gastro-esophageal reflux disease without esophagitis: Secondary | ICD-10-CM | POA: Diagnosis not present

## 2015-07-05 DIAGNOSIS — Z87891 Personal history of nicotine dependence: Secondary | ICD-10-CM | POA: Insufficient documentation

## 2015-07-05 DIAGNOSIS — Z9889 Other specified postprocedural states: Secondary | ICD-10-CM | POA: Diagnosis not present

## 2015-07-05 DIAGNOSIS — I509 Heart failure, unspecified: Secondary | ICD-10-CM | POA: Insufficient documentation

## 2015-07-05 DIAGNOSIS — I2510 Atherosclerotic heart disease of native coronary artery without angina pectoris: Secondary | ICD-10-CM | POA: Insufficient documentation

## 2015-07-05 DIAGNOSIS — F419 Anxiety disorder, unspecified: Secondary | ICD-10-CM | POA: Insufficient documentation

## 2015-07-05 DIAGNOSIS — Z79899 Other long term (current) drug therapy: Secondary | ICD-10-CM | POA: Diagnosis not present

## 2015-07-05 DIAGNOSIS — J42 Unspecified chronic bronchitis: Secondary | ICD-10-CM

## 2015-07-05 LAB — BASIC METABOLIC PANEL
Anion gap: 11 (ref 5–15)
BUN: 25 mg/dL — AB (ref 6–20)
CALCIUM: 9.1 mg/dL (ref 8.9–10.3)
CHLORIDE: 101 mmol/L (ref 101–111)
CO2: 24 mmol/L (ref 22–32)
CREATININE: 1.03 mg/dL (ref 0.61–1.24)
GFR calc non Af Amer: >60 mL/min (ref >60)
GLUCOSE: 143 mg/dL — AB (ref 65–99)
Potassium: 4.9 mmol/L (ref 3.5–5.1)
Sodium: 136 mmol/L (ref 135–145)

## 2015-07-05 LAB — CBC WITH DIFFERENTIAL/PLATELET
BLASTS: 0 %
Band Neutrophils: 0 %
Basophils Absolute: 0 10*3/uL (ref 0.0–0.1)
Basophils Relative: 0 %
Eosinophils Absolute: 0.2 10*3/uL (ref 0.0–0.7)
Eosinophils Relative: 2 %
HEMATOCRIT: 39.2 % (ref 39.0–52.0)
HEMOGLOBIN: 13.7 g/dL (ref 13.0–17.0)
LYMPHS PCT: 19 %
Lymphs Abs: 1.8 10*3/uL (ref 0.7–4.0)
MCH: 32.7 pg (ref 26.0–34.0)
MCHC: 34.9 g/dL (ref 30.0–36.0)
MCV: 93.6 fL (ref 78.0–100.0)
MONOS PCT: 13 %
Metamyelocytes Relative: 0 %
Monocytes Absolute: 1.2 10*3/uL — ABNORMAL HIGH (ref 0.1–1.0)
Myelocytes: 0 %
NEUTROS ABS: 6.3 10*3/uL (ref 1.7–7.7)
NEUTROS PCT: 66 %
NRBC: 0 /100{WBCs}
OTHER: 0 %
PROMYELOCYTES ABS: 0 %
Platelets: 309 10*3/uL (ref 150–400)
RBC: 4.19 MIL/uL — ABNORMAL LOW (ref 4.22–5.81)
RDW: 17.4 % — AB (ref 11.5–15.5)
WBC: 9.5 10*3/uL (ref 4.0–10.5)

## 2015-07-05 LAB — URINALYSIS, ROUTINE W REFLEX MICROSCOPIC
Bilirubin Urine: NEGATIVE
GLUCOSE, UA: NEGATIVE mg/dL
HGB URINE DIPSTICK: NEGATIVE
KETONES UR: NEGATIVE mg/dL
LEUKOCYTES UA: NEGATIVE
Nitrite: NEGATIVE
PH: 5 (ref 5.0–8.0)
Protein, ur: NEGATIVE mg/dL
Specific Gravity, Urine: 1.018 (ref 1.005–1.030)
Urobilinogen, UA: 1 mg/dL (ref 0.0–1.0)

## 2015-07-05 LAB — RAPID URINE DRUG SCREEN, HOSP PERFORMED
AMPHETAMINES: NOT DETECTED
BARBITURATES: NOT DETECTED
BENZODIAZEPINES: POSITIVE — AB
COCAINE: NOT DETECTED
Opiates: POSITIVE — AB
Tetrahydrocannabinol: NOT DETECTED

## 2015-07-05 LAB — ETHANOL: Alcohol, Ethyl (B): <5 mg/dL (ref <5)

## 2015-07-05 LAB — I-STAT TROPONIN, ED: Troponin i, poc: 0.01 ng/mL (ref 0.00–0.08)

## 2015-07-05 MED ORDER — PREDNISONE 5 MG PO TABS: 10.0000 mg | ORAL_TABLET | Freq: Every day | ORAL | Status: DC

## 2015-07-05 MED ORDER — ALBUTEROL SULFATE (2.5 MG/3ML) 0.083% IN NEBU: 5.0000 mg | INHALATION_SOLUTION | Freq: Once | RESPIRATORY_TRACT | Status: DC

## 2015-07-05 MED ORDER — IOHEXOL 300 MG/ML  SOLN
75.0000 mL | Freq: Once | INTRAMUSCULAR | Status: AC | PRN
  Administered 2015-07-05: 75 mL via INTRAVENOUS

## 2015-07-05 NOTE — ED Provider Notes (Signed)
CSN: [id number]     Arrival date & time 07/05/15  1734 History   First MD Initiated Contact with Patient 07/05/15 1749     Chief Complaint  Patient presents with  . Shortness of Breath  . Pneumonia  . Dysphagia     (Consider location/radiation/quality/duration/timing/severity/associated sxs/prior Treatment) Patient is a 81 y.o. male presenting with shortness of breath and pneumonia.  Shortness of Breath Pneumonia Associated symptoms include shortness of breath.  .... Level V caveat for difficulty understanding his history. Patient recently discharged on August 25  after a diagnosis of pneumonia. He continues to cough, although he has chronic COPD. He is concerned about an odd sensation in the right anterior mid neck area. He recently saw a otolaryngologist Dr. Redmond Baseman who apparently did a scoping procedure and saw no abnormalities. No fever, sweats, chills. He drinks alcohol excessively and takes lots of opiates.  He apparently puts a morphine tablet in the right back of his throat on a as-needed basis.  Past Medical History  Diagnosis Date  . Complete heart block     s/p PPM  . PAF (paroxysmal atrial fibrillation)   . HTN (hypertension)   . HLD (hyperlipidemia)   . CAD (coronary artery disease)   . Depression   . Anxiety   . Hemorrhoids, external   . GERD (gastroesophageal reflux disease)   . Hyperplastic colonic polyp   . Diverticulosis of colon   . COPD (chronic obstructive pulmonary disease)   . Myasthenia gravis   . Arthritis, rheumatoid   . BPH (benign prostatic hypertrophy)   . Cirrhosis of liver   . Asthma   . Alcohol abuse   . Hiatal hernia   . Skin cancer of forehead     and left ear  . Lung nodule   . Nephrolithiasis   . CHF (congestive heart failure)   . Pacemaker     Medtronic  . Herpes zoster   . Hoarding disorder with poor insight   . Esophageal stricture    Past Surgical History  Procedure Laterality Date  . Total knee arthroplasty      bilateral   . Total hip arthroplasty      x3  . Splenectomy    . Appendectomy    . Pacemaker insertion  2009    medtronic  . Cardiac catheterization  10/12/2007    EF 60%  . Inguinal hernia repair    . Esophagogastroduodenoscopy N/A 09/23/2014    Procedure: ESOPHAGOGASTRODUODENOSCOPY (EGD);  Surgeon: Ladene Artist, MD;  Location: [facility];  Service: Endoscopy;  Laterality: N/A;  . Savory dilation N/A 09/23/2014    Procedure: SAVORY DILATION;  Surgeon: Ladene Artist, MD;  Location: [facility];  Service: Endoscopy;  Laterality: N/A;  . Skin cancer excision     Family History  Problem Relation Age of Onset  . Myasthenia gravis Mother   . Esophageal cancer Father   . HIV Son     Aids  . Diabetes Son   . Heart disease Maternal Uncle   . Colon cancer Other     Cousin on Mother's side  . Colon polyps Neg Hx    Social History  Substance Use Topics  . Smoking status: Former Smoker -- 40 years    Types: Cigars    Quit date: 10/22/1987  . Smokeless tobacco: Never Used  . Alcohol Use: 8.4 oz/week    14 Cans of beer per week    Review of Systems  Unable to perform ROS: Other  Respiratory: Positive for shortness of breath.       Allergies  Celebrex; Remicade; Alendronate sodium; and Benazepril  Home Medications   Prior to Admission medications   Medication Sig Start Date End Date Taking? Authorizing Provider  aspirin EC 81 MG tablet Take 81 mg by mouth daily.   Yes Historical Provider, MD  augmented betamethasone dipropionate (DIPROLENE-AF) 0.05 % ointment Apply 1 application topically daily as needed (for arms).  06/08/15  Yes Historical Provider, MD  azaTHIOprine (IMURAN) 50 MG tablet Take 50 mg by mouth 2 (two) times daily.  11/07/13  Yes Barton Dubois, MD  budesonide-formoterol [facility]) 160-4.5 MCG/ACT inhaler Inhale 2 puffs into the lungs 2 (two) times daily.   Yes Historical Provider, MD  docusate sodium (COLACE) 100 MG capsule Take 400 mg by mouth every 4 (four) hours  as needed (for opioids).    Yes Historical Provider, MD  escitalopram (LEXAPRO) 20 MG tablet Take 20 mg by mouth daily.  09/16/14  Yes Historical Provider, MD  esomeprazole (NEXIUM) 40 MG capsule Take 40 mg by mouth 2 (two) times daily before a meal.    Yes Historical Provider, MD  FIBER PO Take 1 tablet by mouth daily.    Yes Historical Provider, MD  finasteride (PROSCAR) 5 MG tablet Take 5 mg by mouth daily.  11/17/13  Yes Historical Provider, MD  furosemide (LASIX) 40 MG tablet Take 1 tablet (40 mg total) by mouth daily. 06/15/15  Yes Robbie Lis, MD  KLOR-CON M20 20 MEQ tablet Take 20 mEq by mouth daily.  02/22/15  Yes Historical Provider, MD  morphine (MSIR) 15 MG tablet Take 15 mg by mouth 2 (two) times daily as needed (for breakthrough pain).  05/19/15  Yes Historical Provider, MD  Omega-3 Fatty Acids (FISH OIL) 1000 MG CAPS Take 1 capsule by mouth daily.   Yes Historical Provider, MD  Probiotic Product ([place]) CAPS Take 1 capsule by mouth daily.   Yes Historical Provider, MD  spironolactone (ALDACTONE) 25 MG tablet Take 25 mg by mouth daily.   Yes Historical Provider, MD  temazepam (RESTORIL) 30 MG capsule Take 30 mg by mouth at bedtime. 05/25/15  Yes Historical Provider, MD  levofloxacin (LEVAQUIN) 500 MG tablet Take 1 tablet (500 mg total) by mouth daily. 06/15/15   Robbie Lis, MD  predniSONE (DELTASONE) 5 MG tablet Take 2 tablets (10 mg total) by mouth daily with breakfast. 07/05/15   Nat Christen, MD   BP 132/67 mmHg  Pulse 79  Temp(Src) 98 F (36.7 C) (Oral)  Resp 16  SpO2 97% Physical Exam  Constitutional: He is oriented to person, place, and time.  Cachectic, no acute distress  HENT:  Head: Normocephalic and atraumatic.  Oral cavity examined and no masses noted.  Eyes: Conjunctivae and EOM are normal. Pupils are equal, round, and reactive to light.  Neck: Normal range of motion. Neck supple.  Minimal right mid anterior cervical adenopathy  Cardiovascular: Normal  rate and regular rhythm.   Pulmonary/Chest: Effort normal.  Minimal rhonchi bilaterally  Abdominal: Soft. Bowel sounds are normal.  Musculoskeletal: Normal range of motion.  Neurological: He is alert and oriented to person, place, and time.  Skin: Skin is warm and dry.  Psychiatric: He has a normal mood and affect. His behavior is normal.  Nursing note and vitals reviewed.   ED Course  Procedures (including critical care time) Labs Review Labs Reviewed  CBC WITH DIFFERENTIAL/PLATELET - Abnormal; Notable for the following:  RBC 4.19 (*)    RDW 17.4 (*)    Monocytes Absolute 1.2 (*)    All other components within normal limits  BASIC METABOLIC PANEL - Abnormal; Notable for the following:    Glucose, Bld 143 (*)    BUN 25 (*)    All other components within normal limits  URINE RAPID DRUG SCREEN, HOSP PERFORMED - Abnormal; Notable for the following:    Opiates POSITIVE (*)    Benzodiazepines POSITIVE (*)    All other components within normal limits  ETHANOL  URINALYSIS, ROUTINE W REFLEX MICROSCOPIC (NOT AT [facility])  Randolm Idol, ED    Imaging Review Ct Soft Tissue Neck W Contrast  07/05/2015   CLINICAL DATA:  Painful swallowing. History of squamous cell carcinoma  EXAM: CT NECK WITH CONTRAST  TECHNIQUE: Multidetector CT imaging of the neck was performed using the standard protocol following the bolus administration of intravenous contrast.  CONTRAST:  59mL OMNIPAQUE IOHEXOL 300 MG/ML  SOLN  COMPARISON:  None.  FINDINGS: Pharynx and larynx: No mass or abscess is identified. Slight asymmetry at the base of the tongue on the left but this is most likely asymmetric lymphoid tissue. I do not see a discrete mass. The epiglottis is normal. The paraglottic fat planes are maintained. The piriform sinus and vallecular air spaces are normal. No evidence of tonsillar inflammation, mass or abscess.  Salivary glands: The parotid and submandibular glands appear normal and symmetric bilaterally.   Thyroid: Normal.  Lymph nodes: Small scattered neck nodes bilaterally but no mass or overt adenopathy. Small supraclavicular lymph nodes are also noted bilaterally.  Vascular: Advanced atherosclerotic calcifications involving the carotid arteries bilaterally with suspected high-grade stenosis involving the left common carotid artery near the carotid bulb.  Limited intracranial: No significant intracranial abnormalities.  Visualized orbits: Not visualized.  Mastoids and visualized paranasal sinuses: Grossly clear.  Skeleton: Severe degenerative cervical spondylosis.  Upper chest: Small upper mediastinal lymph nodes are noted. No mass or overt adenopathy. The lung apices are clear.  IMPRESSION: 1. No mass, adenopathy or abscess is identified. 2. Severe atherosclerotic calcifications involving the carotid arteries.   Electronically Signed   By: Marijo Sanes M.D.   On: 07/05/2015 20:04   Dg Chest Portable 1 View  07/05/2015   CLINICAL DATA:  81 year old male with COPD and CHF presenting with shortness of breath.  EXAM: PORTABLE CHEST - 1 VIEW  COMPARISON:  Radiograph dated 06/12/2015  FINDINGS: Single-view of the chest demonstrate severe emphysematous changes of the lungs. There has been interval improvement of the previously seen right lung base opacity. A small right pleural effusions again noted. There is no pneumothorax. For there is a stable calcified granuloma in the right mid lung field. The osseous structures are grossly unremarkable.  IMPRESSION: Interval improvement of the previously seen right lower lung field opacity.  A small right pleural effusion.   Electronically Signed   By: Anner Crete M.D.   On: 07/05/2015 18:29   I have personally reviewed and evaluated these images and lab results as part of my medical decision-making.   EKG Interpretation   Date/Time:  Wednesday July 05 2015 17:39:43 EDT Ventricular Rate:  78 PR Interval:    QRS Duration: 150 QT Interval:  472 QTC  Calculation: 538 R Axis:   -63 Text Interpretation:  Sinus rhythm with complete heart block and  Ventricular-paced rhythm Abnormal ECG Confirmed by Lacinda Axon  MD, Bayley Hurn ([id number])  on 07/05/2015 6:38:04 PM      MDM  Final diagnoses:  Chronic bronchitis, unspecified chronic bronchitis type    Patient is hemodynamically stable. No respiratory distress. Chest x-ray shows interval improvement of the right lung field opacity. A small right pleural effusion is noted. CT of neck shows no masses.  Drug screen positive for opiates and benzodiazepine's. Patient has a primary care physician. All these findings were discussed with the patient and his daughter. Discharge medication prednisone    Nat Christen, MD 07/05/15 2154

## 2015-07-05 NOTE — Discharge Instructions (Signed)
Tests showed no life-threatening problems. Follow-up your primary care doctor. Prescription for prednisone.

## 2015-07-05 NOTE — ED Notes (Signed)
Pt was admitted and dx with pneumonia several weeks ago, today is here with continued SOB and trouble swallowing due to pain in his throat. Pt reports is in constant pain due to arthritis. Pt is a x 4. Sounds congested.

## 2015-07-14 ENCOUNTER — Telehealth: Payer: Self-pay | Admitting: Gastroenterology

## 2015-07-14 NOTE — Telephone Encounter (Signed)
Patient with severe dysphagia.  He took 4 hours to eat his lunch.  He will come in and see Nicoletta Ba PA on Monday at 11:00

## 2015-07-17 ENCOUNTER — Encounter: Payer: Self-pay | Admitting: Physician Assistant

## 2015-07-17 ENCOUNTER — Ambulatory Visit (INDEPENDENT_AMBULATORY_CARE_PROVIDER_SITE_OTHER): Payer: Medicare Other | Admitting: Physician Assistant

## 2015-07-17 VITALS — BP 110/80 | HR 84 | Ht 70.0 in | Wt 140.8 lb

## 2015-07-17 DIAGNOSIS — R1310 Dysphagia, unspecified: Secondary | ICD-10-CM

## 2015-07-17 DIAGNOSIS — R070 Pain in throat: Secondary | ICD-10-CM

## 2015-07-17 DIAGNOSIS — I2510 Atherosclerotic heart disease of native coronary artery without angina pectoris: Secondary | ICD-10-CM | POA: Diagnosis not present

## 2015-07-17 MED ORDER — NYSTATIN 100000 UNIT/ML MT SUSP: OROMUCOSAL | Status: DC

## 2015-07-17 NOTE — Progress Notes (Signed)
Reviewed and agree with management plan.  Malcolm T. Stark, MD FACG 

## 2015-07-17 NOTE — Patient Instructions (Signed)
We sent a prescription for an oral solution to [facility] [address]  Take 5 cc's by mouth and swish and swallow 4 times daily for 2 weeks.  You have been scheduled for an endoscopy. Please follow written instructions given to you at your visit today. If you use inhalers (even only as needed), please bring them with you on the day of your procedure.

## 2015-07-17 NOTE — Progress Notes (Signed)
Patient ID: [name] [name], male   DOB: [birth year], 81 y.o.   MRN: [id number]   Subjective:    Patient ID: [name] [name], male    DOB: 08/10/1929, 81 y.o.   MRN: [id number]  HPI [name] [name]  Is an 81 year old white male known to Dr. 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 Also with atrial fibrillation, COPD, rheumatoid arthritis for which she is on azathioprine, myasthenia gravis, coronary artery disease and congestive heart failure with EF of 40-45%. He was seen by Dr. Fuller Plan in August 2016 at that time complaining of right neck pain radiating to his jaw. Referred to ENT. In the interim before that appointment he was admitted with a pneumonia. He had speech path evaluation during that admission due to his complaints of dysphagia and odynophagia. Speech path swallowing eval was  consistent with mild pharyngeal dysphagia with minimal penetration and they recommended to dysphagia 3 diet.  Since that time he been seen by Dr. Redmond Baseman for ENT and states that laryngoscopy in his office was negative.  Patient comes in very frustrated today stating that "no one has helped me and you're not going to help me either" he says he has been having pain and soreness in his throat over the past almost year and a half. This is on the right side ,ays he has seen multiple doctors and run up acute spell but nobody has figured out the etiology of his pain. He says at this point there is pain in the back of his mouth and in his throat which is a burning sensation present 24 /7. He had been to the ER recently was evaluated by Dr. Lacinda Axon and had CT of the soft tissue neck  Which showed  No mass or abscess there was slight asymmetry at the base of the tongue on the left but this was most likely asymmetric lymphoid tissue no discrete mass, epiglottis normal periglottic fat planes maintained tonsillar inflammation mass or abscess , there was advanced  atherosclerotic calcifications involving the carotids, small scattered neck nodes bilaterally and small supraclavicular lymph nodes also noted bilaterally but not pathologic in size.  Patient says he hurts all the time but this is worse immediately with swallowing. He had been given a steroid Dosepak in the ER which she said she found somewhat beneficial. He states he been just treated previously for thrush as well without benefit though I don't see that documented in his chart.   patient is a difficult historian.  He did have EGD in December 2015 for complaints of dysphagia and was noted to have esophagitis in his midesophagus possible pill ulcer or infectious with stricturing with a diameter of 11 mm multiple biopsies were obtained and he was several dilated to 15 mm.  Biopsies showed benign esophageal mucosa with acute inflammation and inflammatory debris negative for dysplasia stains negative for fungus and HSV immunostaining was negative  Review of Systems Pertinent positive and negative review of systems were noted in the above HPI section.  All other review of systems was otherwise negative.  Outpatient Encounter Prescriptions as of 07/17/2015  Medication Sig  . aspirin EC 81 MG tablet Take 81 mg by mouth daily.  Marland Kitchen augmented betamethasone dipropionate (DIPROLENE-AF) 0.05 % ointment Apply 1 application topically daily as needed (for arms).   . azaTHIOprine (IMURAN) 50 MG tablet Take 50 mg by mouth 2 (two) times daily.   . budesonide-formoterol (SYMBICORT) 160-4.5 MCG/ACT inhaler Inhale  2 puffs into the lungs 2 (two) times daily.  Marland Kitchen docusate sodium (COLACE) 100 MG capsule Take 400 mg by mouth every 4 (four) hours as needed (for opioids).   . escitalopram (LEXAPRO) 20 MG tablet Take 20 mg by mouth daily.   Marland Kitchen esomeprazole (NEXIUM) 40 MG capsule Take 40 mg by mouth 2 (two) times daily before a meal.   . FIBER PO Take 1 tablet by mouth daily.   . finasteride (PROSCAR) 5 MG tablet Take 5 mg by mouth  daily.   . furosemide (LASIX) 40 MG tablet Take 1 tablet (40 mg total) by mouth daily.  Marland Kitchen KLOR-CON M20 20 MEQ tablet Take 20 mEq by mouth daily.   Marland Kitchen levofloxacin (LEVAQUIN) 500 MG tablet Take 1 tablet (500 mg total) by mouth daily.  Marland Kitchen morphine (MSIR) 15 MG tablet Take 15 mg by mouth 2 (two) times daily as needed (for breakthrough pain).   . Omega-3 Fatty Acids (FISH OIL) 1000 MG CAPS Take 1 capsule by mouth daily.  . predniSONE (DELTASONE) 5 MG tablet Take 2 tablets (10 mg total) by mouth daily with breakfast.  . Probiotic Product ([place]) CAPS Take 1 capsule by mouth daily.  Marland Kitchen spironolactone (ALDACTONE) 25 MG tablet Take 25 mg by mouth daily.  . temazepam (RESTORIL) 30 MG capsule Take 30 mg by mouth at bedtime.  Marland Kitchen nystatin (MYCOSTATIN) 100000 UNIT/ML suspension Take 5 cc's by mouth and swish and swallow 4 times daily for 14 days.   No facility-administered encounter medications on file as of 07/17/2015.   Allergies  Allergen Reactions  . Celebrex [Celecoxib] Nausea And Vomiting and Other (See Comments)    Study med program at [facility]  Stomach ulcers and bleeding  . Remicade [Infliximab] Other (See Comments)    Study med program at [facility]   . Alendronate Sodium Other (See Comments)    Unknown.  . Benazepril Other (See Comments)    Unknown.   Patient Active Problem List   Diagnosis Date Noted  . Malnutrition of moderate degree 06/15/2015  . Hoarding disorder with poor insight 11/29/2014  . Alcohol abuse 11/26/2014  . COPD (chronic obstructive pulmonary disease) 11/26/2014  . Anemia due to other cause 11/26/2014  . Chronic combined systolic and diastolic congestive heart failure 03/10/2014  . Herpes zoster 12/20/2013  . SOB (shortness of breath) 11/04/2013  . CHF (congestive heart failure) 11/03/2013  . Dyspnea 11/03/2013  . CAP (community acquired pneumonia): Probable 11/03/2013  . COPD with acute exacerbation: Probable 11/03/2013  . Bronchitis: Probable  11/03/2013  . Postural dizziness 02/13/2012  . CAD (coronary artery disease) 03/15/2011  . Essential hypertension, benign 12/13/2010  . Complete heart block 12/13/2010  . Atrial fibrillation 12/13/2010  . Anxiety state 03/04/2008  . Depression 03/04/2008  . EXTERNAL HEMORRHOIDS 03/04/2008  . ASTHMA 03/04/2008  . GERD 03/04/2008  . DIVERTICULOSIS, COLON 03/04/2008  . NEPHROLITHIASIS 03/04/2008  . ARTHRITIS, RHEUMATOID 03/04/2008  . Myasthenia gravis 03/04/2008  . COLONIC POLYPS, HYPERPLASTIC 02/04/2006   Social History   Social History  . Marital Status: Widowed    Spouse Name: N/A  . Number of Children: 4  . Years of Education: N/A   Occupational History  .  [employer]  . retired Nurse, mental health    Social History Main Topics  . Smoking status: Former Smoker -- 40 years    Types: Cigars    Quit date: 10/22/1987  . Smokeless tobacco: Never Used  . Alcohol Use: 8.4 oz/week  14 Cans of beer per week  . Drug Use: No  . Sexual Activity: Not on file   Other Topics Concern  . Not on file   Social History Narrative    Mr. Mulkern's family history includes Colon cancer in his other; Diabetes in his son; Esophageal cancer in his father; HIV in his son; Heart disease in his maternal uncle; Myasthenia gravis in his mother. There is no history of Colon polyps.      Objective:    Filed Vitals:   07/17/15 1140  BP: 110/80  Pulse: 84    Physical Exam   Chronically ill-appearing thin elderly white male in no acute distress blood pressure 110/80 pulse 84 height 5 foot 10 weight 140. HEENT;nontraumatic normocephalic EOMI PERRLA sclera anicteric examination of the oropharynx shows a yellowish exudate on the tongue no definite plaques consistent with Candida on the oropharynx no other lesion visualized , neck; supple , no palpable adenopathy , Cardiovascular; regular rate and rhythm with S1-S2 no murmur rub or gallop pacemaker in the left chest wall, Pulmonary;  somewhat decreased breath sounds bilaterally, Abdomen; soft nontender nondistended bowel sounds are present and palpable mass or hepatosplenomegaly , Extremities; no clubbing cyanosis or edema skin warm and dry, Neuropsych; mood and affect odd but appropriate       Assessment & Plan:   #1 81 yo male with c/o dysphagia and odynophagia  X over one year -fairly constant,  With pain c/o in throat or upper esophagus Recent laryngoscopy unrevealing CT soft tissue neck- mild asymmetry left tongue base - no mass EGD 12/15 with mid esophageal ulceration and stricture- dilated to 15 mm  Etiology of his pain is not clear -as he did have a mid esophageal ulcer and stricture will r/o esophageal etiology #2 COPD #3 myasthenia gravis  #4  afib,heart block-s/p pacemaker #5 RA- on AZA #6 CAD #7 CHF #8 Cirrhosis secondary to ETOH  Plan; Mycostatin orla suspension 5 cc 4 x daily swish and swallow x 10 days Schedule for EGD with possible dilation with Dr Fuller Plan- procedure discussed in detailwith pt and he is agreeable to proceed. No other med changes until see what EGD shows  ? Consider viscous lidicaine   Amy S Esterwood PA-C 07/17/2015   Cc: Crist Infante, MD

## 2015-07-19 ENCOUNTER — Encounter: Payer: Self-pay | Admitting: *Deleted

## 2015-07-20 ENCOUNTER — Ambulatory Visit (AMBULATORY_SURGERY_CENTER): Payer: Medicare Other | Admitting: Gastroenterology

## 2015-07-20 ENCOUNTER — Encounter: Payer: Self-pay | Admitting: Gastroenterology

## 2015-07-20 VITALS — BP 127/68 | HR 60 | Temp 96.0°F | Resp 17 | Ht 70.0 in | Wt 140.0 lb

## 2015-07-20 DIAGNOSIS — K2970 Gastritis, unspecified, without bleeding: Secondary | ICD-10-CM

## 2015-07-20 DIAGNOSIS — R1310 Dysphagia, unspecified: Secondary | ICD-10-CM | POA: Diagnosis not present

## 2015-07-20 DIAGNOSIS — R070 Pain in throat: Secondary | ICD-10-CM | POA: Diagnosis not present

## 2015-07-20 DIAGNOSIS — M542 Cervicalgia: Secondary | ICD-10-CM

## 2015-07-20 MED ORDER — SODIUM CHLORIDE 0.9 % IV SOLN: 500.0000 mL | INTRAVENOUS | Status: DC

## 2015-07-20 NOTE — Op Note (Signed)
[place]  [address] [place], [id number]   ENDOSCOPY PROCEDURE REPORT  PATIENT: [name] [name], [name] [name]  MR#: [id number] BIRTHDATE: 1928-11-21 , 85  yrs. old GENDER: male ENDOSCOPIST: Ladene Artist, MD, Marval Regal REFERRED BY:  Crist Infante, M.D. PROCEDURE DATE:  07/20/2015 PROCEDURE:  EGD, diagnostic ASA CLASS:     Class III INDICATIONS:  Odynophagia, throat pain, neck pain,. MEDICATIONS: Monitored anesthesia care and Propofol 100 mg IV TOPICAL ANESTHETIC: none DESCRIPTION OF PROCEDURE: After the risks benefits and alternatives of the procedure were thoroughly explained, informed consent was obtained.  The LB [id number] endoscope was introduced through the mouth and advanced to the second portion of the duodenum , Without limitations.  The instrument was slowly withdrawn as the mucosa was fully examined.    ESOPHAGUS: The mucosa of the esophagus appeared normal.  No stricture, stenosis, inflammation or other abnormality noted. STOMACH: Gastritis  was found in the gastric antrum.  Multiple biopsies were performed.   The stomach otherwise appeared normal.  DUODENUM: The duodenal mucosa showed no abnormalities in the bulb and 2nd part of the duodenum.  Retroflexed views revealed no abnormalities.     The scope was then withdrawn from the patient and the procedure completed.  COMPLICATIONS: There were no immediate complications.  ENDOSCOPIC IMPRESSION: 1.   The mucosa of the esophagus appeared normal 2.   Gastritis in the gastric antrum; multiple biopsies performed 3.   The EGD otherwise appeared normal  RECOMMENDATIONS: 1.  Await pathology results 2.  No GI cause for odynophagia, neck pain, throat pain was found. Follow up with ENT and PCP   eSigned:  Ladene Artist, MD, [facility] 07/20/2015 11:18 AM

## 2015-07-20 NOTE — Patient Instructions (Signed)
YOU HAD AN ENDOSCOPIC PROCEDURE TODAY AT [facility]:   Refer to the procedure report that was given to you for any specific questions about what was found during the examination.  If the procedure report does not answer your questions, please call your gastroenterologist to clarify.  If you requested that your care partner not be given the details of your procedure findings, then the procedure report has been included in a sealed envelope for you to review at your convenience later.  YOU SHOULD EXPECT: Some feelings of bloating in the abdomen. Passage of more gas than usual.  Walking can help get rid of the air that was put into your GI tract during the procedure and reduce the bloating. If you had a lower endoscopy (such as a colonoscopy or flexible sigmoidoscopy) you may notice spotting of blood in your stool or on the toilet paper. If you underwent a bowel prep for your procedure, you may not have a normal bowel movement for a few days.  Please Note:  You might notice some irritation and congestion in your nose or some drainage.  This is from the oxygen used during your procedure.  There is no need for concern and it should clear up in a day or so.  SYMPTOMS TO REPORT IMMEDIATELY:     Following upper endoscopy (EGD)  Vomiting of blood or coffee ground material  New chest pain or pain under the shoulder blades  Painful or persistently difficult swallowing  New shortness of breath  Fever of 100F or higher  Black, tarry-looking stools  For urgent or emergent issues, a gastroenterologist can be reached at any hour by calling [phone].   DIET: Your first meal following the procedure should be a small meal and then it is ok to progress to your normal diet. Heavy or fried foods are harder to digest and may make you feel nauseous or bloated.  Likewise, meals heavy in dairy and vegetables can increase bloating.  Drink plenty of fluids but you should avoid alcoholic beverages  for 24 hours.  ACTIVITY:  You should plan to take it easy for the rest of today and you should NOT DRIVE or use heavy machinery until tomorrow (because of the sedation medicines used during the test).    FOLLOW UP: Our staff will call the number listed on your records the next business day following your procedure to check on you and address any questions or concerns that you may have regarding the information given to you following your procedure. If we do not reach you, we will leave a message.  However, if you are feeling well and you are not experiencing any problems, there is no need to return our call.  We will assume that you have returned to your regular daily activities without incident.  If any biopsies were taken you will be contacted by phone or by letter within the next 1-3 weeks.  Please call us at [phone] if you have not heard about the biopsies in 3 weeks.    SIGNATURES/CONFIDENTIALITY: You and/or your care partner have signed paperwork which will be entered into your electronic medical record.  These signatures attest to the fact that that the information above on your After Visit Summary has been reviewed and is understood.  Full responsibility of the confidentiality of this discharge information lies with you and/or your care-partner.   Resume medications. Information given on Gastritis.

## 2015-07-20 NOTE — Progress Notes (Signed)
  [facility] Post-op Note  Patient: [name] [name]  Procedure(s) Performed: endoscopy  Patient Location: [facility] - Recovery Area  Anesthesia Type: Deep Sedation/Propofol  Level of Consciousness: awake, oriented and patient cooperative  Airway and Oxygen Therapy: Patient Spontanous Breathing  Post-op Pain: none  Post-op Assessment:  Post-op Vital signs reviewed, Patient's Cardiovascular Status Stable, Respiratory Function Stable, Patent Airway, No signs of Nausea or vomiting and Pain level controlled  Post-op Vital Signs: Reviewed and stable  Complications: No apparent anesthesia complications  EARGLE, BETH E 11:12 AM

## 2015-07-20 NOTE — Progress Notes (Signed)
Called to room to assist during endoscopic procedure.  Patient ID and intended procedure confirmed with present staff. Received instructions for my participation in the procedure from the performing physician.  

## 2015-07-21 ENCOUNTER — Telehealth: Payer: Self-pay | Admitting: *Deleted

## 2015-07-21 NOTE — Telephone Encounter (Signed)
  Follow up Call-  Call back number 07/20/2015  Post procedure Call Back phone  # [phone]  Permission to leave phone message Yes     When I called this pt back this a.m., his speech was slurred and I could barely understand what he was saying.  When I asked how he was, he states "hell, my throat hurts."  He continues to talk, but I cannot understand him and he is unable to tell me how badly his throat hurts or if he has been able to eat.  Also, unable to tell me if he is having SOB, chest pain.   I did call his daughter, who was his care partner yesterday.  I asked her if she could go and check on he father just to make sure he is ok.  She states, "he doesn't go to bed til 3, he drinks and takes pain medication every night.  He doesn't get up til noon, so this doesn't sound unusual for him.  He is always groggy with slurred speech when he gets up."  I still asked her to go and check on her father, just to make sure he wasn't having any complications from his procedure yesterday.  She states that she will go over to his home right now and will call us if he is having any problems  Dr. Fuller Plan,  I just wanted to make you aware of this.  Cyril Mourning

## 2015-07-21 NOTE — Telephone Encounter (Signed)
I called to check on pt- his speech is still slurred, but he states, "i'm ok I guess"

## 2015-07-25 ENCOUNTER — Encounter: Payer: Self-pay | Admitting: Gastroenterology

## 2015-07-31 ENCOUNTER — Telehealth: Payer: Self-pay | Admitting: Gastroenterology

## 2015-07-31 ENCOUNTER — Inpatient Hospital Stay (HOSPITAL_COMMUNITY)
Admission: EM | Admit: 2015-07-31 | Discharge: 2015-08-07 | DRG: 391 | Disposition: A | Discharge status: Hospice | Payer: Medicare Other | Attending: Internal Medicine | Admitting: Internal Medicine

## 2015-07-31 ENCOUNTER — Encounter (HOSPITAL_COMMUNITY): Payer: Self-pay | Admitting: Cardiology

## 2015-07-31 ENCOUNTER — Emergency Department (HOSPITAL_COMMUNITY): Payer: Medicare Other

## 2015-07-31 DIAGNOSIS — I5042 Chronic combined systolic (congestive) and diastolic (congestive) heart failure: Secondary | ICD-10-CM | POA: Diagnosis present

## 2015-07-31 DIAGNOSIS — R1310 Dysphagia, unspecified: Secondary | ICD-10-CM | POA: Diagnosis not present

## 2015-07-31 DIAGNOSIS — Z86010 Personal history of colonic polyps: Secondary | ICD-10-CM

## 2015-07-31 DIAGNOSIS — D6489 Other specified anemias: Secondary | ICD-10-CM

## 2015-07-31 DIAGNOSIS — K219 Gastro-esophageal reflux disease without esophagitis: Secondary | ICD-10-CM | POA: Diagnosis present

## 2015-07-31 DIAGNOSIS — J690 Pneumonitis due to inhalation of food and vomit: Secondary | ICD-10-CM | POA: Insufficient documentation

## 2015-07-31 DIAGNOSIS — R52 Pain, unspecified: Secondary | ICD-10-CM

## 2015-07-31 DIAGNOSIS — K7030 Alcoholic cirrhosis of liver without ascites: Secondary | ICD-10-CM | POA: Diagnosis present

## 2015-07-31 DIAGNOSIS — I10 Essential (primary) hypertension: Secondary | ICD-10-CM

## 2015-07-31 DIAGNOSIS — Z886 Allergy status to analgesic agent status: Secondary | ICD-10-CM

## 2015-07-31 DIAGNOSIS — E860 Dehydration: Secondary | ICD-10-CM | POA: Diagnosis present

## 2015-07-31 DIAGNOSIS — R1313 Dysphagia, pharyngeal phase: Principal | ICD-10-CM | POA: Diagnosis present

## 2015-07-31 DIAGNOSIS — I4891 Unspecified atrial fibrillation: Secondary | ICD-10-CM

## 2015-07-31 DIAGNOSIS — G7000 Myasthenia gravis without (acute) exacerbation: Secondary | ICD-10-CM | POA: Diagnosis present

## 2015-07-31 DIAGNOSIS — I2510 Atherosclerotic heart disease of native coronary artery without angina pectoris: Secondary | ICD-10-CM | POA: Diagnosis not present

## 2015-07-31 DIAGNOSIS — Z515 Encounter for palliative care: Secondary | ICD-10-CM | POA: Insufficient documentation

## 2015-07-31 DIAGNOSIS — Z8249 Family history of ischemic heart disease and other diseases of the circulatory system: Secondary | ICD-10-CM

## 2015-07-31 DIAGNOSIS — N400 Enlarged prostate without lower urinary tract symptoms: Secondary | ICD-10-CM | POA: Diagnosis present

## 2015-07-31 DIAGNOSIS — Z950 Presence of cardiac pacemaker: Secondary | ICD-10-CM

## 2015-07-31 DIAGNOSIS — R509 Fever, unspecified: Secondary | ICD-10-CM

## 2015-07-31 DIAGNOSIS — M069 Rheumatoid arthritis, unspecified: Secondary | ICD-10-CM | POA: Diagnosis present

## 2015-07-31 DIAGNOSIS — Z681 Body mass index (BMI) 19 or less, adult: Secondary | ICD-10-CM

## 2015-07-31 DIAGNOSIS — J449 Chronic obstructive pulmonary disease, unspecified: Secondary | ICD-10-CM | POA: Diagnosis present

## 2015-07-31 DIAGNOSIS — Z888 Allergy status to other drugs, medicaments and biological substances status: Secondary | ICD-10-CM

## 2015-07-31 DIAGNOSIS — E43 Unspecified severe protein-calorie malnutrition: Secondary | ICD-10-CM | POA: Diagnosis present

## 2015-07-31 DIAGNOSIS — E785 Hyperlipidemia, unspecified: Secondary | ICD-10-CM | POA: Diagnosis present

## 2015-07-31 DIAGNOSIS — I480 Paroxysmal atrial fibrillation: Secondary | ICD-10-CM | POA: Diagnosis present

## 2015-07-31 DIAGNOSIS — Z87891 Personal history of nicotine dependence: Secondary | ICD-10-CM

## 2015-07-31 DIAGNOSIS — R627 Adult failure to thrive: Secondary | ICD-10-CM | POA: Diagnosis present

## 2015-07-31 DIAGNOSIS — Z96642 Presence of left artificial hip joint: Secondary | ICD-10-CM | POA: Diagnosis present

## 2015-07-31 DIAGNOSIS — R6251 Failure to thrive (child): Secondary | ICD-10-CM

## 2015-07-31 DIAGNOSIS — Z85828 Personal history of other malignant neoplasm of skin: Secondary | ICD-10-CM

## 2015-07-31 DIAGNOSIS — D649 Anemia, unspecified: Secondary | ICD-10-CM | POA: Diagnosis present

## 2015-07-31 DIAGNOSIS — F419 Anxiety disorder, unspecified: Secondary | ICD-10-CM | POA: Diagnosis present

## 2015-07-31 DIAGNOSIS — J42 Unspecified chronic bronchitis: Secondary | ICD-10-CM

## 2015-07-31 DIAGNOSIS — I110 Hypertensive heart disease with heart failure: Secondary | ICD-10-CM | POA: Diagnosis present

## 2015-07-31 DIAGNOSIS — F1010 Alcohol abuse, uncomplicated: Secondary | ICD-10-CM | POA: Diagnosis present

## 2015-07-31 DIAGNOSIS — F329 Major depressive disorder, single episode, unspecified: Secondary | ICD-10-CM | POA: Diagnosis present

## 2015-07-31 DIAGNOSIS — J45909 Unspecified asthma, uncomplicated: Secondary | ICD-10-CM | POA: Diagnosis present

## 2015-07-31 DIAGNOSIS — Z66 Do not resuscitate: Secondary | ICD-10-CM | POA: Diagnosis present

## 2015-07-31 DIAGNOSIS — I442 Atrioventricular block, complete: Secondary | ICD-10-CM | POA: Diagnosis present

## 2015-07-31 LAB — COMPREHENSIVE METABOLIC PANEL
ALK PHOS: 108 U/L (ref 38–126)
ALT: 13 U/L — ABNORMAL LOW (ref 17–63)
ANION GAP: 12 (ref 5–15)
AST: 30 U/L (ref 15–41)
Albumin: 2.8 g/dL — ABNORMAL LOW (ref 3.5–5.0)
BILIRUBIN TOTAL: 0.6 mg/dL (ref 0.3–1.2)
BUN: 20 mg/dL (ref 6–20)
CALCIUM: 9.2 mg/dL (ref 8.9–10.3)
CO2: 27 mmol/L (ref 22–32)
Chloride: 97 mmol/L — ABNORMAL LOW (ref 101–111)
Creatinine, Ser: 0.78 mg/dL (ref 0.61–1.24)
Glucose, Bld: 153 mg/dL — ABNORMAL HIGH (ref 65–99)
Potassium: 4.4 mmol/L (ref 3.5–5.1)
Sodium: 136 mmol/L (ref 135–145)
TOTAL PROTEIN: 7.5 g/dL (ref 6.5–8.1)

## 2015-07-31 LAB — CBC WITH DIFFERENTIAL/PLATELET
Basophils Absolute: 0 10*3/uL (ref 0.0–0.1)
Basophils Relative: 1 %
EOS ABS: 0.2 10*3/uL (ref 0.0–0.7)
Eosinophils Relative: 3 %
HEMATOCRIT: 40.1 % (ref 39.0–52.0)
HEMOGLOBIN: 14 g/dL (ref 13.0–17.0)
LYMPHS ABS: 1.6 10*3/uL (ref 0.7–4.0)
Lymphocytes Relative: 24 %
MCH: 32.6 pg (ref 26.0–34.0)
MCHC: 34.9 g/dL (ref 30.0–36.0)
MCV: 93.5 fL (ref 78.0–100.0)
MONOS PCT: 15 %
Monocytes Absolute: 1 10*3/uL (ref 0.1–1.0)
NEUTROS ABS: 3.8 10*3/uL (ref 1.7–7.7)
NEUTROS PCT: 57 %
Platelets: 399 10*3/uL (ref 150–400)
RBC: 4.29 MIL/uL (ref 4.22–5.81)
RDW: 17.9 % — ABNORMAL HIGH (ref 11.5–15.5)
WBC: 6.6 10*3/uL (ref 4.0–10.5)

## 2015-07-31 LAB — URINALYSIS, ROUTINE W REFLEX MICROSCOPIC
Glucose, UA: NEGATIVE mg/dL
HGB URINE DIPSTICK: NEGATIVE
Ketones, ur: 15 mg/dL — AB
Leukocytes, UA: NEGATIVE
Nitrite: NEGATIVE
PROTEIN: NEGATIVE mg/dL
Specific Gravity, Urine: 1.018 (ref 1.005–1.030)
UROBILINOGEN UA: 2 mg/dL — AB (ref 0.0–1.0)
pH: 8.5 — ABNORMAL HIGH (ref 5.0–8.0)

## 2015-07-31 LAB — I-STAT CG4 LACTIC ACID, ED
Lactic Acid, Venous: 1.85 mmol/L (ref 0.5–2.0)
Lactic Acid, Venous: 1.29 mmol/L (ref 0.5–2.0)

## 2015-07-31 LAB — LIPASE, BLOOD: Lipase: 37 U/L (ref 22–51)

## 2015-07-31 LAB — AMMONIA: AMMONIA: 31 umol/L (ref 9–35)

## 2015-07-31 MED ORDER — FUROSEMIDE 40 MG PO TABS
40.0000 mg | ORAL_TABLET | Freq: Every day | ORAL | Status: DC
  Administered 2015-08-01 – 2015-08-06 (×4): 40 mg via ORAL
  Filled 2015-07-31 (×5): qty 1

## 2015-07-31 MED ORDER — FENTANYL CITRATE (PF) 100 MCG/2ML IJ SOLN
50.0000 ug | INTRAMUSCULAR | Status: DC | PRN
  Administered 2015-08-01 – 2015-08-04 (×18): 50 ug via INTRAVENOUS
  Filled 2015-07-31 (×19): qty 2

## 2015-07-31 MED ORDER — FENTANYL CITRATE (PF) 100 MCG/2ML IJ SOLN
50.0000 ug | Freq: Once | INTRAMUSCULAR | Status: AC
  Administered 2015-07-31: 50 ug via INTRAVENOUS
  Filled 2015-07-31: qty 2

## 2015-07-31 MED ORDER — ESCITALOPRAM OXALATE 20 MG PO TABS
20.0000 mg | ORAL_TABLET | Freq: Every day | ORAL | Status: DC
  Administered 2015-08-01: 20 mg via ORAL
  Filled 2015-07-31 (×2): qty 1

## 2015-07-31 MED ORDER — TEMAZEPAM 15 MG PO CAPS
30.0000 mg | ORAL_CAPSULE | Freq: Every day | ORAL | Status: DC
  Administered 2015-08-01: 30 mg via ORAL
  Filled 2015-07-31: qty 2

## 2015-07-31 MED ORDER — SPIRONOLACTONE 50 MG PO TABS
50.0000 mg | ORAL_TABLET | Freq: Every day | ORAL | Status: DC
  Administered 2015-08-01: 50 mg via ORAL
  Filled 2015-07-31: qty 1

## 2015-07-31 MED ORDER — SODIUM CHLORIDE 0.9 % IV SOLN
INTRAVENOUS | Status: DC
  Administered 2015-07-31 – 2015-08-05 (×6): via INTRAVENOUS

## 2015-07-31 MED ORDER — ASPIRIN EC 81 MG PO TBEC
81.0000 mg | DELAYED_RELEASE_TABLET | Freq: Every day | ORAL | Status: DC
  Administered 2015-08-01: 81 mg via ORAL
  Filled 2015-07-31: qty 1

## 2015-07-31 MED ORDER — IOHEXOL 300 MG/ML  SOLN
100.0000 mL | Freq: Once | INTRAMUSCULAR | Status: AC | PRN
  Administered 2015-07-31: 100 mL via INTRAVENOUS

## 2015-07-31 MED ORDER — AZATHIOPRINE 50 MG PO TABS
50.0000 mg | ORAL_TABLET | Freq: Two times a day (BID) | ORAL | Status: DC
  Administered 2015-08-01 – 2015-08-06 (×11): 50 mg via ORAL
  Filled 2015-07-31 (×12): qty 1

## 2015-07-31 MED ORDER — BUDESONIDE-FORMOTEROL FUMARATE 160-4.5 MCG/ACT IN AERO
2.0000 | INHALATION_SPRAY | Freq: Two times a day (BID) | RESPIRATORY_TRACT | Status: DC
  Administered 2015-08-01 – 2015-08-06 (×10): 2 via RESPIRATORY_TRACT
  Filled 2015-07-31 (×3): qty 6

## 2015-07-31 MED ORDER — MORPHINE SULFATE 15 MG PO TABS
15.0000 mg | ORAL_TABLET | Freq: Two times a day (BID) | ORAL | Status: DC | PRN
  Administered 2015-08-01: 15 mg via ORAL
  Filled 2015-07-31 (×2): qty 1

## 2015-07-31 MED ORDER — SODIUM CHLORIDE 0.9 % IV SOLN
Freq: Once | INTRAVENOUS | Status: AC
  Administered 2015-07-31: 16:00:00 via INTRAVENOUS

## 2015-07-31 MED ORDER — PANTOPRAZOLE SODIUM 40 MG PO TBEC
40.0000 mg | DELAYED_RELEASE_TABLET | Freq: Every day | ORAL | Status: DC
Start: 2015-08-01 — End: 2015-08-01
  Administered 2015-08-01: 40 mg via ORAL
  Filled 2015-07-31: qty 1

## 2015-07-31 NOTE — ED Notes (Signed)
Dr. Amedeo Gory made aware of pt requesting pain meds

## 2015-07-31 NOTE — H&P (Signed)
History and Physical  [name] [name]  NWG:[id number]  DOB: [birth year]  DOA: 07/31/2015  Referring physician: Ivin Booty, MD PCP: [name], MD   Chief Complaint: Odynophagia, intractable  HPI: [name] [name] is a 81 y.o. male with a past medical history significant for chronic dysphagia, myasthenia gravis, RA, alcoholic cirrhosis, chronic systolic and diastolic CHF (EF 57% in 05/4695), HTN, COPD, aFib and complete heart block with pacemaker, not on warfarin reportedly because of fall risk who presents with increasing pain with swallowing.  All history collected from the patient who is an adequate historian and from chart review. The patient reports a 1 year history of dysphagia and pain with swallowing.  Review of old records reveals that the patient has had 1 year of pain:   -Around the start of this, he had dysphagia and then endoscopy that showed strictures which were treated by Dr. Fuller Plan.  This dilation helped the dysphagia until two months ago, when the patient developed right neck pain.   -This new neck pain was treated with PPI and nystatin liquid, neither of which helped.   -The patient had a CT of the neck which was unremarkable.   -He was admitted for pneumonia in the midst of this, and had a swallow evaluation during that time that wasconsistent with mild pharyngeal dysphagia with minimal penetration.   -After that, he had a repeat endoscopy that showed nothing and was referred to ENT.  -He saw Dr. Redmond Baseman from [facility], who reportedly did an office laryngoscopy that was negative.   In the last two weeks, this pain has worsened, is now severe, located in the upper right neck, and excruciating with attempting to swallow anything.  Because of pain, the patient hasn't eaten anything in two days and today was too weak to stand alone, and so his daughter brought him to the ER.  In the ED, the patient was afebrile and hemodynamically stable.  He had an elevated BUN-creatinine  ratio, and was weak with standing.  His pain was adequately controlled with IV fentanyl, but he could not swallow without excruciating pain.  Family had left and did not return calls for more information.      Review of Systems:  Patient seen 10:34 PM on 07/31/2015. Pt complains of neck pain, difficulty swallowing liquids or solids, weaknesss, poor appetite.  All other systems negative except as just noted or noted in the history of present illness.  Past Medical History  Diagnosis Date  . Complete heart block (HCC)     s/p PPM  . PAF (paroxysmal atrial fibrillation) ([place])   . HTN (hypertension)   . HLD (hyperlipidemia)   . CAD (coronary artery disease)   . Depression   . Anxiety   . Hemorrhoids, external   . GERD (gastroesophageal reflux disease)   . Hyperplastic colonic polyp   . Diverticulosis of colon   . COPD (chronic obstructive pulmonary disease) ([place])   . Myasthenia gravis   . Arthritis, rheumatoid ([place])   . BPH (benign prostatic hypertrophy)   . Cirrhosis of liver ([place])   . Asthma   . Alcohol abuse   . Hiatal hernia   . Skin cancer of forehead     and left ear  . Lung nodule   . Nephrolithiasis   . CHF (congestive heart failure) ([place])   . Pacemaker     Medtronic  . Herpes zoster   . Hoarding disorder with poor insight   . Esophageal stricture   The  above past medical history was reviewed.  Past Surgical History  Procedure Laterality Date  . Total knee arthroplasty      bilateral  . Total hip arthroplasty      x3  . Splenectomy    . Appendectomy    . Pacemaker insertion  2009    medtronic  . Cardiac catheterization  10/12/2007    EF 60%  . Inguinal hernia repair    . Esophagogastroduodenoscopy N/A 09/23/2014    Procedure: ESOPHAGOGASTRODUODENOSCOPY (EGD);  Surgeon: Ladene Artist, MD;  Location: [facility];  Service: Endoscopy;  Laterality: N/A;  . Savory dilation N/A 09/23/2014    Procedure: SAVORY DILATION;  Surgeon: Ladene Artist, MD;   Location: [facility];  Service: Endoscopy;  Laterality: N/A;  . Skin cancer excision    The above surgical history was reviewed.  Social History: Patient lives alone.  He denies alcohol use in the last month.  He is a former smoker.  He is a former Nurse, mental health at [employer].  He is from [place].  Allergies  Allergen Reactions  . Celebrex [Celecoxib] Nausea And Vomiting and Other (See Comments)    Study med program at [employer]  Stomach ulcers and bleeding  . Remicade [Infliximab] Other (See Comments)    Study med program at [facility]   . Alendronate Sodium Other (See Comments)    Unknown.  . Benazepril Other (See Comments)    Unknown.    Family History  Problem Relation Age of Onset  . Myasthenia gravis Mother   . Esophageal cancer Father   . HIV Son     Aids  . Diabetes Son   . Heart disease Maternal Uncle   . Colon cancer Other     Cousin on Mother's side  . Colon polyps Neg Hx     Prior to Admission medications   Medication Sig Start Date End Date Taking? Authorizing Provider  aspirin EC 81 MG tablet Take 81 mg by mouth daily.   Yes Historical Provider, MD  augmented betamethasone dipropionate (DIPROLENE-AF) 0.05 % ointment Apply 1 application topically daily as needed (for arms).  06/08/15  Yes Historical Provider, MD  azaTHIOprine (IMURAN) 50 MG tablet Take 50 mg by mouth 2 (two) times daily.  11/07/13  Yes Barton Dubois, MD  budesonide-formoterol [facility]) 160-4.5 MCG/ACT inhaler Inhale 2 puffs into the lungs 2 (two) times daily.   Yes Historical Provider, MD  docusate sodium (COLACE) 100 MG capsule Take 400 mg by mouth every 4 (four) hours as needed (for opioids).    Yes Historical Provider, MD  escitalopram (LEXAPRO) 20 MG tablet Take 20 mg by mouth daily.  09/16/14  Yes Historical Provider, MD  esomeprazole (NEXIUM) 40 MG capsule Take 40 mg by mouth 2 (two) times daily before a meal.    Yes Historical Provider, MD  FIBER PO Take 1 tablet by mouth daily.    Yes  Historical Provider, MD  finasteride (PROSCAR) 5 MG tablet Take 5 mg by mouth daily.  11/17/13  Yes Historical Provider, MD  furosemide (LASIX) 40 MG tablet Take 1 tablet (40 mg total) by mouth daily. 06/15/15  Yes Robbie Lis, MD  KLOR-CON M20 20 MEQ tablet Take 20 mEq by mouth daily.  02/22/15  Yes Historical Provider, MD  morphine (MSIR) 15 MG tablet Take 15 mg by mouth 2 (two) times daily as needed (for breakthrough pain).  05/19/15  Yes Historical Provider, MD  nystatin (MYCOSTATIN) 100000 UNIT/ML suspension Take 5 cc's  by mouth and swish and swallow 4 times daily for 14 days. Patient taking differently: Use as directed 5 mLs in the mouth or throat 4 (four) times daily. Take 5 cc's by mouth and swish and swallow 4 times daily for 14 days. 07/17/15  Yes Amy S Esterwood, PA-C  Probiotic Product ([place]) CAPS Take 1 capsule by mouth daily.   Yes Historical Provider, MD  spironolactone (ALDACTONE) 50 MG tablet Take 50 mg by mouth daily.  07/06/15  Yes Historical Provider, MD  temazepam (RESTORIL) 30 MG capsule Take 30 mg by mouth at bedtime. 05/25/15  Yes Historical Provider, MD  Omega-3 Fatty Acids (FISH OIL) 1000 MG CAPS Take 1 capsule by mouth daily.    Historical Provider, MD    Physical Exam: BP 126/69 mmHg  Pulse 61  Temp(Src) 97.9 F (36.6 C) (Oral)  Resp 20  SpO2 99% General appearance: Thin, frail, elderly male, alert and in no acute distress.  Responds appropriately to questions.  There is temporal wasting.  The muscle bulk is diminished globally.  There is loss of thenar eminence. Eyes: Sclerae normal without icterus, conjunctiva pink, lids and lashes normal.  No ptosis.  PERRL and EOMI.   Nose: No deformity, discharge, or epistaxis.   Mouth: OP tacky without erythema or ulcers.  No airway deformities.   Lymph: No cervical, supraclavicular or axillary lymphadenopathy. Skin: Warm and dry.  No jaundice.  Cardiac: RRR, nl S1-S2, no murmurs appreciated.   Respiratory:  Normal respiratory rate and rhythm.  CTAB without rales or wheezes. Abdomen: BS present.  Abdomen soft without rigidity.  No TTP or rebound all quadrants. No ascites, distension.   Neuro: Sensorium intact.   Speech is fluent.  Attention and concentration are normal.  Memory seems intact.  Moves all extremities equally and with normal coordination.   Globally weak.   Psych: Appropriate affect.  Speech normal. Thought content/process linear/appropriate.  No evidence of aural or visual hallucinations or delusions.       Labs on Admission:  The metabolic panel is notable for normal sodium, potassium, bicarbonate. The BUN is 20 and creatinine 0.78 mg/dL. The abdomen is 2.8 g/dL The complete blood count is notable for normal WBC, hemoglobin. Normal lactic acid. Normal ammonia. No transaminitis or elevation in bilirubin.   Radiological Exams on Admission: Ct Abdomen Pelvis W Contrast 07/31/2015  IMPRESSION: Images of the inferior pelvis limited by beam hardening artifact arising from left hip replacement.  Diverticulosis sigmoid colon, with no diverticulitis appreciated allowing for limitations discussed above.  Trace ascites in the pelvis, cause uncertain. It is possible that it may be due to fluid overload, as there is cardiac enlargement and the appearance of the liver suggests venous congestion. Alternatively it may be related to cirrhosis, which is suspected based on liver contour.  Evidence to suggest chronic inflammation of the proximal half of the colon.     Personally reviewed: Dg Chest Portable 1 View 07/31/2015 Old granulomas.  No new infiltrates.   EKG: Independently reviewed. LBBB, normal rate, paced.    Assessment/Plan  1. Odynophagia, intractable:  This is worse and is now intractable. The patient has been treated empirically for GERD and candidiasis without success. CT imaging, endoscopy and laryngoscopy have not revealed infectious or malignant causes of throat  pain. -Fentanyl 50 g every 2 hours when necessary for severe pain -Consult palliative care for intractable pain, dysphagia, and discussion of goals of care with patient and his daughter given the futility of current  workup and his degree of weight loss and inability of care for himself at home. -Dysphagia diet as able -Attempt home oral medicines as able  2. Failure to thrive:  This is new.  The patient has lost 6 kg since August admission and 15 kg since May Cardiology appt.  His albumin in 2.8 g/dL and he has diffuse muscle wasting and weakness.   -Consult to palliative care, appreciate recommendations -Nutritional supplements  3. Rheumatoid arthritis:  Stable. -Continue azathioprine   4. Chronic combined systolic and diastolic congestive heart failure:  Stable. -Continue aspirin, furosemide, and spironolactone  5. BPH:  Stable.  -Continue finasteride  6. Depression:  Possibly contributing to #2 above. -Continue escitalopram      DVT PPx: Low risk Diet: Dysphagia 3 Consultants: Palliative Care Code Status: DNR Family Communication: Atempted to call the patient's daughter Mrs Vertell Novak but got no answer and received no call back as of the time of writing this note.   Disposition Plan:  At the point of initial evaluation, it is my clinical opinion that admission for OBSERVATION is reasonable and necessary because the patient's presenting complaints in the context of their chronic conditions represent sufficient risk of deterioration or significant morbidity to constitute reasonable grounds for close observation in the hospital setting, but that the patient may be medically stable for discharge from the hospital within 24 to 48 hours.       Edwin Dada [facility] Pager [phone]

## 2015-07-31 NOTE — ED Notes (Signed)
Pt stood up with asst. To obtain u/a at bedside

## 2015-07-31 NOTE — ED Notes (Signed)
Pt ambulated with min. Asst. In room.  Pt stood at bedside to urinate.

## 2015-07-31 NOTE — ED Notes (Signed)
Family at the bedside. Reports health has been declining over the past couple of months. Recently had an endoscopy related to painful swallowing that was normal. Pt takes oxycodone and also drinks ETOH on a regular basis. Has been out of his pain medication for the past couple of days. Recent admission for PNA.

## 2015-07-31 NOTE — ED Provider Notes (Signed)
CSN: [id number]     Arrival date & time 07/31/15  1444 History   None    Chief Complaint  Patient presents with  . Weakness    Patient is a 81 y.o. male presenting with general illness. The history is provided by the patient, a relative and medical records.  Illness Location:  Throat, generalized Quality:  Generalized weakness, intractable severe throat pain, decreased PO intake, unable to care for self Severity:  Moderate Onset quality:  Gradual Duration: "many weeks" Timing:  Constant Chronicity:  New Context:  Pt with outpt workup for throat pain / dysphagia by SLP, ENT, GI and unclear cause, has been taking too many pain meds and drinking ETOH to deal with this, lives by self and pt  daughter states unable to care for himself safely with decreased PO intake and unable to get out of bed - finds pt in bed with stool and urine Relieved by:  Naroctics to sleep Worsened by:  Swallowing  Ineffective treatments:  Mouth washes Associated symptoms: abdominal pain (generalized, unable to give good history of this ), fatigue and sore throat   Associated symptoms: no chest pain, no cough, no fever, no nausea, no rash, no shortness of breath and no vomiting     Past Medical History  Diagnosis Date  . Complete heart block (HCC)     s/p PPM  . PAF (paroxysmal atrial fibrillation) ([place])   . HTN (hypertension)   . HLD (hyperlipidemia)   . CAD (coronary artery disease)   . Depression   . Anxiety   . Hemorrhoids, external   . GERD (gastroesophageal reflux disease)   . Hyperplastic colonic polyp   . Diverticulosis of colon   . COPD (chronic obstructive pulmonary disease) ([place])   . Myasthenia gravis   . Arthritis, rheumatoid ([place])   . BPH (benign prostatic hypertrophy)   . Cirrhosis of liver ([place])   . Asthma   . Alcohol abuse   . Hiatal hernia   . Skin cancer of forehead     and left ear  . Lung nodule   . Nephrolithiasis   . CHF (congestive heart failure) ([place])   . Pacemaker      Medtronic  . Herpes zoster   . Hoarding disorder with poor insight   . Esophageal stricture    Past Surgical History  Procedure Laterality Date  . Total knee arthroplasty      bilateral  . Total hip arthroplasty      x3  . Splenectomy    . Appendectomy    . Pacemaker insertion  2009    medtronic  . Cardiac catheterization  10/12/2007    EF 60%  . Inguinal hernia repair    . Esophagogastroduodenoscopy N/A 09/23/2014    Procedure: ESOPHAGOGASTRODUODENOSCOPY (EGD);  Surgeon: Ladene Artist, MD;  Location: [facility];  Service: Endoscopy;  Laterality: N/A;  . Savory dilation N/A 09/23/2014    Procedure: SAVORY DILATION;  Surgeon: Ladene Artist, MD;  Location: [facility];  Service: Endoscopy;  Laterality: N/A;  . Skin cancer excision     Family History  Problem Relation Age of Onset  . Myasthenia gravis Mother   . Esophageal cancer Father   . HIV Son     Aids  . Diabetes Son   . Heart disease Maternal Uncle   . Colon cancer Other     Cousin on Mother's side  . Colon polyps Neg Hx    Social History  Substance Use Topics  .  Smoking status: Former Smoker -- 40 years    Types: Cigars    Quit date: 10/22/1987  . Smokeless tobacco: Never Used  . Alcohol Use: 8.4 oz/week    14 Cans of beer per week     Comment: Not in last month.    Review of Systems  Constitutional: Positive for fatigue and unexpected weight change (daughter states "he has lost SO much weight with all of this happening"). Negative for fever.  HENT: Positive for drooling and sore throat.   Eyes: Negative for visual disturbance.  Respiratory: Negative for cough and shortness of breath.   Cardiovascular: Negative for chest pain.  Gastrointestinal: Positive for abdominal pain (generalized, unable to give good history of this ). Negative for nausea and vomiting.  Genitourinary: Negative for decreased urine volume.  Skin: Negative for rash.  Allergic/Immunologic: Negative for immunocompromised state.   Neurological: Positive for weakness.  Psychiatric/Behavioral: Positive for behavioral problems.    Allergies  Celebrex; Remicade; Alendronate sodium; and Benazepril  Home Medications   Prior to Admission medications   Medication Sig Start Date End Date Taking? Authorizing Provider  aspirin EC 81 MG tablet Take 81 mg by mouth daily.   Yes Historical Provider, MD  augmented betamethasone dipropionate (DIPROLENE-AF) 0.05 % ointment Apply 1 application topically daily as needed (for arms).  06/08/15  Yes Historical Provider, MD  azaTHIOprine (IMURAN) 50 MG tablet Take 50 mg by mouth 2 (two) times daily.  11/07/13  Yes Barton Dubois, MD  budesonide-formoterol [facility]) 160-4.5 MCG/ACT inhaler Inhale 2 puffs into the lungs 2 (two) times daily.   Yes Historical Provider, MD  docusate sodium (COLACE) 100 MG capsule Take 400 mg by mouth every 4 (four) hours as needed (for opioids).    Yes Historical Provider, MD  escitalopram (LEXAPRO) 20 MG tablet Take 20 mg by mouth daily.  09/16/14  Yes Historical Provider, MD  esomeprazole (NEXIUM) 40 MG capsule Take 40 mg by mouth 2 (two) times daily before a meal.    Yes Historical Provider, MD  FIBER PO Take 1 tablet by mouth daily.    Yes Historical Provider, MD  finasteride (PROSCAR) 5 MG tablet Take 5 mg by mouth daily.  11/17/13  Yes Historical Provider, MD  furosemide (LASIX) 40 MG tablet Take 1 tablet (40 mg total) by mouth daily. 06/15/15  Yes Robbie Lis, MD  KLOR-CON M20 20 MEQ tablet Take 20 mEq by mouth daily.  02/22/15  Yes Historical Provider, MD  morphine (MSIR) 15 MG tablet Take 15 mg by mouth 2 (two) times daily as needed (for breakthrough pain).  05/19/15  Yes Historical Provider, MD  nystatin (MYCOSTATIN) 100000 UNIT/ML suspension Take 5 cc's by mouth and swish and swallow 4 times daily for 14 days. Patient taking differently: Use as directed 5 mLs in the mouth or throat 4 (four) times daily. Take 5 cc's by mouth and swish and swallow 4 times  daily for 14 days. 07/17/15  Yes Amy S Esterwood, PA-C  Probiotic Product ([place]) CAPS Take 1 capsule by mouth daily.   Yes Historical Provider, MD  spironolactone (ALDACTONE) 50 MG tablet Take 50 mg by mouth daily.  07/06/15  Yes Historical Provider, MD  temazepam (RESTORIL) 30 MG capsule Take 30 mg by mouth at bedtime. 05/25/15  Yes Historical Provider, MD  Omega-3 Fatty Acids (FISH OIL) 1000 MG CAPS Take 1 capsule by mouth daily.    Historical Provider, MD   BP 138/70 mmHg  Pulse 61  Temp(Src)  97.9 F (36.6 C) (Oral)  Resp 13  SpO2 97% Physical Exam  Constitutional: He appears well-developed and well-nourished. No distress.  HENT:  Head: Normocephalic and atraumatic.  Posterior oropharynx unremarkable  Eyes: Pupils are equal, round, and reactive to light. Right eye exhibits no discharge. Left eye exhibits no discharge.  Neck: No tracheal deviation present.  Cardiovascular: Normal rate and regular rhythm.   Pulmonary/Chest: Effort normal. No respiratory distress. He has wheezes.  Abdominal: Soft. He exhibits no distension. There is tenderness (diffusely ttp, difficult to localized, guarding, no rebound, soft).  Musculoskeletal: He exhibits no edema.  Neurological: He is alert.  Able to answer most questions appropriately but not give appropriate recent history, most info obtained from daughter at bedside and from chart, perseverates on "please help me- I'm hurting so bad" and pointing to throat  Skin: Skin is warm and dry.  Psychiatric:  As above- intermittently agitated and complaining of pain and requesting narcotics   Nursing note and vitals reviewed.   ED Course  Procedures (including critical care time) Labs Review Labs Reviewed  CBC WITH DIFFERENTIAL/PLATELET - Abnormal; Notable for the following:    RDW 17.9 (*)    All other components within normal limits  COMPREHENSIVE METABOLIC PANEL - Abnormal; Notable for the following:    Chloride 97 (*)    Glucose,  Bld 153 (*)    Albumin 2.8 (*)    ALT 13 (*)    All other components within normal limits  URINALYSIS, ROUTINE W REFLEX MICROSCOPIC (NOT AT [facility]) - Abnormal; Notable for the following:    Color, Urine AMBER (*)    pH 8.5 (*)    Bilirubin Urine SMALL (*)    Ketones, ur 15 (*)    Urobilinogen, UA 2.0 (*)    All other components within normal limits  AMMONIA  LIPASE, BLOOD  BASIC METABOLIC PANEL  CBC  I-STAT CG4 LACTIC ACID, ED  I-STAT CG4 LACTIC ACID, ED    Imaging Review Ct Abdomen Pelvis W Contrast  07/31/2015   CLINICAL DATA:  Diffuse abdominal pain, malaise, weight loss  EXAM: CT ABDOMEN AND PELVIS WITH CONTRAST  TECHNIQUE: Multidetector CT imaging of the abdomen and pelvis was performed using the standard protocol following bolus administration of intravenous contrast.  CONTRAST:  178mL OMNIPAQUE IOHEXOL 300 MG/ML  SOLN  COMPARISON:  02/05/2013  FINDINGS: Lower chest: Consolidation inferior aspect of the right middle lobe most consistent with atelectasis. Calcification inferior right hilum measuring about 1 cm suggesting calcified adenopathy. Focus of parenchymal calcification posterior lateral right lung base measuring about 1 cm, stable when compared to prior study. Mitral valve calcification. Coronary artery calcification. Pacer leads noted. Small partially loculated right pleural effusions similar to prior study. Cardiac enlargement.  Hepatobiliary: Mottled enhancement pattern in the liver with relatively decreased enhancement peripherally seen on the portal phase, less apparent on delayed images. This is consistent with hepatic venous congestion likely of cardiac origin.Mildly nodular hepatic contour suggesting cirrhosis likely also related to the hepatic parenchymal heterogeneity.  Pancreas: Negative  Spleen: Absent  Adrenals/Urinary Tract: No masses identified. No evidence of hydronephrosis.  Stomach/Bowel: There is significant diverticulosis of the sigmoid colon, which is too large  degree obscured by beam attenuation artifact from left hip replacement. There is wall thickening and narrowing of ascending and proximal transverse colon suggesting possibility of chronic inflammation. Nonobstructive bowel gas pattern present.  Vascular/Lymphatic: Mild to moderate atherosclerotic calcification of the aortoiliac vessels.  Reproductive: No mass or other significant abnormality.  Other: There may be a small volume of free fluid in the pelvis as seen to the right side posterior to the bladder on image number 67. Limited evaluation of this area due to beam attenuation artifact from left femur.  Musculoskeletal: Left hip replacement causes significant beam attenuation artifact, limiting this study. Chronic changes left acetabulum. Severe degenerative change right hip joint.  IMPRESSION: Images of the inferior pelvis limited by beam hardening artifact arising from left hip replacement.  Diverticulosis sigmoid colon, with no diverticulitis appreciated allowing for limitations discussed above.  Trace ascites in the pelvis, cause uncertain. It is possible that it may be due to fluid overload, as there is cardiac enlargement and the appearance of the liver suggests venous congestion. Alternatively it may be related to cirrhosis, which is suspected based on liver contour.  Evidence to suggest chronic inflammation of the proximal half of the colon.   Electronically Signed   By: Skipper Cliche M.D.   On: 07/31/2015 19:33   Dg Chest Portable 1 View  07/31/2015   CLINICAL DATA:  Weakness. Hypertension. Coronary artery disease. COPD.  EXAM: PORTABLE CHEST 1 VIEW  COMPARISON:  07/05/2015  FINDINGS: Reverse lordotic projection. Dual lead pacer, proximal and distal leads projecting over the right atrium and ventricle, respectively. Mild enlargement of the cardiopericardial silhouette, without edema. Blunting of the right lateral costophrenic angle compatible small right pleural effusion with passive atelectasis.   Calcified right lower lobe pulmonary nodules, one near the lung base and the other near the minor fissure, previously worked up by chest CT in 2008 and felt to be benign.  Degenerative glenohumeral arthropathy bilaterally with loss of articular space and spurring of the humeral heads.  Atherosclerotic calcification of the aortic arch.  IMPRESSION: 1. Mild enlargement of the cardiopericardial silhouette, without edema. 2. Small right pleural effusion with passive atelectasis. 3. Right lower lobe calcified granulomas, benign. 4. Atherosclerotic aortic arch 5. Degenerative glenohumeral arthropathy.   Electronically Signed   By: Van Clines M.D.   On: 07/31/2015 15:32   I have personally reviewed and evaluated these images and lab results as part of my medical decision-making.   EKG Interpretation   Date/Time:  Monday July 31 2015 14:52:02 EDT Ventricular Rate:  63 PR Interval:    QRS Duration: 140 QT Interval:  518 QTC Calculation: 530 R Axis:   -48 Text Interpretation:  Electronic ventricular pacemaker Left bundle branch  block No significant change since last tracing Confirmed by KNAPP  MD-J,  JON ([id number]) on 07/31/2015 3:59:51 PM      MDM   Final diagnoses:  Failure to thrive (0-17)  Intractable pain    81 yo M with hx myasthenia gravis, COPD, CAD, CHF, RA, HTN, pAF and heart block s/p pacemaker, anxiety, ETOH/substance abuse presenting with generalized weakness, weight loss, intractable throat pain, poor PO intake, concern for inability to safely care for self as above. Pt afebrile, hemodynamically stable, exam most notable for diffuse abdominal pain that is poorly localized. Workup notable for mild dehydration though no lactic acidosis or leukocytosis. Low albumin. No acute findings on chest x-ray or CT of abdomen and pelvis. Reviewed extensive prior workup regarding dysphagia and throat pain. Unable to find source for ongoing severe throat pain. Given fentanyl and IV fluids  with some mild improvement in condition and overall weakness. However, patient lives by himself and family expresses concern that pt is unable to safely care for self at home. Admitted to observation for failure to thrive, referral for  hospice and possible home health services versus nursing facility placement, as well as intractable throat pain.  Case discussed with Dr. Tomi Bamberger, who oversaw management of this patient.    Ivin Booty, MD 07/31/15 2356  Dorie Rank, MD 08/01/15 [phone]

## 2015-07-31 NOTE — ED Notes (Signed)
Pt requesting more pain meds

## 2015-07-31 NOTE — ED Notes (Signed)
Pt's daughter st's pt has been out of his pain meds x's 3 days because he has been taking to many.  Also st's pt is in a lot of pain.

## 2015-07-31 NOTE — ED Notes (Signed)
Daughter remains at bedside. No complaints voiced by pt at this time

## 2015-07-31 NOTE — ED Notes (Signed)
Admitting MD at bedside.  Fentanyl given for pain

## 2015-07-31 NOTE — ED Notes (Signed)
Pt to department via car- pt was pulled out of the car by EMS staff. Reports weakness and pain all over for indefinite amount of time. States he is cold.

## 2015-08-01 DIAGNOSIS — J690 Pneumonitis due to inhalation of food and vomit: Secondary | ICD-10-CM | POA: Diagnosis not present

## 2015-08-01 DIAGNOSIS — Z681 Body mass index (BMI) 19 or less, adult: Secondary | ICD-10-CM | POA: Diagnosis not present

## 2015-08-01 DIAGNOSIS — N400 Enlarged prostate without lower urinary tract symptoms: Secondary | ICD-10-CM | POA: Diagnosis present

## 2015-08-01 DIAGNOSIS — I110 Hypertensive heart disease with heart failure: Secondary | ICD-10-CM | POA: Diagnosis present

## 2015-08-01 DIAGNOSIS — D649 Anemia, unspecified: Secondary | ICD-10-CM | POA: Diagnosis present

## 2015-08-01 DIAGNOSIS — K219 Gastro-esophageal reflux disease without esophagitis: Secondary | ICD-10-CM | POA: Diagnosis present

## 2015-08-01 DIAGNOSIS — Z66 Do not resuscitate: Secondary | ICD-10-CM | POA: Diagnosis present

## 2015-08-01 DIAGNOSIS — K7030 Alcoholic cirrhosis of liver without ascites: Secondary | ICD-10-CM | POA: Diagnosis present

## 2015-08-01 DIAGNOSIS — I5042 Chronic combined systolic (congestive) and diastolic (congestive) heart failure: Secondary | ICD-10-CM | POA: Diagnosis present

## 2015-08-01 DIAGNOSIS — R1313 Dysphagia, pharyngeal phase: Secondary | ICD-10-CM | POA: Diagnosis not present

## 2015-08-01 DIAGNOSIS — M1990 Unspecified osteoarthritis, unspecified site: Secondary | ICD-10-CM | POA: Diagnosis not present

## 2015-08-01 DIAGNOSIS — E785 Hyperlipidemia, unspecified: Secondary | ICD-10-CM | POA: Diagnosis present

## 2015-08-01 DIAGNOSIS — I480 Paroxysmal atrial fibrillation: Secondary | ICD-10-CM | POA: Diagnosis present

## 2015-08-01 DIAGNOSIS — Z950 Presence of cardiac pacemaker: Secondary | ICD-10-CM | POA: Diagnosis not present

## 2015-08-01 DIAGNOSIS — Z7189 Other specified counseling: Secondary | ICD-10-CM | POA: Diagnosis not present

## 2015-08-01 DIAGNOSIS — F1010 Alcohol abuse, uncomplicated: Secondary | ICD-10-CM | POA: Diagnosis present

## 2015-08-01 DIAGNOSIS — Z515 Encounter for palliative care: Secondary | ICD-10-CM | POA: Diagnosis present

## 2015-08-01 DIAGNOSIS — Z8249 Family history of ischemic heart disease and other diseases of the circulatory system: Secondary | ICD-10-CM | POA: Diagnosis not present

## 2015-08-01 DIAGNOSIS — E860 Dehydration: Secondary | ICD-10-CM | POA: Diagnosis present

## 2015-08-01 DIAGNOSIS — R627 Adult failure to thrive: Secondary | ICD-10-CM | POA: Diagnosis not present

## 2015-08-01 DIAGNOSIS — I2510 Atherosclerotic heart disease of native coronary artery without angina pectoris: Secondary | ICD-10-CM | POA: Diagnosis present

## 2015-08-01 DIAGNOSIS — Z86010 Personal history of colonic polyps: Secondary | ICD-10-CM | POA: Diagnosis not present

## 2015-08-01 DIAGNOSIS — M069 Rheumatoid arthritis, unspecified: Secondary | ICD-10-CM | POA: Diagnosis present

## 2015-08-01 DIAGNOSIS — Z85828 Personal history of other malignant neoplasm of skin: Secondary | ICD-10-CM | POA: Diagnosis not present

## 2015-08-01 DIAGNOSIS — J45909 Unspecified asthma, uncomplicated: Secondary | ICD-10-CM | POA: Diagnosis present

## 2015-08-01 DIAGNOSIS — Z87891 Personal history of nicotine dependence: Secondary | ICD-10-CM | POA: Diagnosis not present

## 2015-08-01 DIAGNOSIS — Z888 Allergy status to other drugs, medicaments and biological substances status: Secondary | ICD-10-CM | POA: Diagnosis not present

## 2015-08-01 DIAGNOSIS — F419 Anxiety disorder, unspecified: Secondary | ICD-10-CM | POA: Diagnosis present

## 2015-08-01 DIAGNOSIS — F329 Major depressive disorder, single episode, unspecified: Secondary | ICD-10-CM | POA: Diagnosis present

## 2015-08-01 DIAGNOSIS — Z96642 Presence of left artificial hip joint: Secondary | ICD-10-CM | POA: Diagnosis present

## 2015-08-01 DIAGNOSIS — Z886 Allergy status to analgesic agent status: Secondary | ICD-10-CM | POA: Diagnosis not present

## 2015-08-01 DIAGNOSIS — G7000 Myasthenia gravis without (acute) exacerbation: Secondary | ICD-10-CM | POA: Diagnosis not present

## 2015-08-01 DIAGNOSIS — J449 Chronic obstructive pulmonary disease, unspecified: Secondary | ICD-10-CM | POA: Diagnosis present

## 2015-08-01 DIAGNOSIS — D6489 Other specified anemias: Secondary | ICD-10-CM | POA: Diagnosis not present

## 2015-08-01 DIAGNOSIS — R1310 Dysphagia, unspecified: Secondary | ICD-10-CM | POA: Diagnosis not present

## 2015-08-01 DIAGNOSIS — E43 Unspecified severe protein-calorie malnutrition: Secondary | ICD-10-CM | POA: Diagnosis not present

## 2015-08-01 DIAGNOSIS — R52 Pain, unspecified: Secondary | ICD-10-CM | POA: Diagnosis not present

## 2015-08-01 LAB — BASIC METABOLIC PANEL
Anion gap: 12 (ref 5–15)
BUN: 18 mg/dL (ref 6–20)
CALCIUM: 8.8 mg/dL — AB (ref 8.9–10.3)
CO2: 25 mmol/L (ref 22–32)
CREATININE: 0.72 mg/dL (ref 0.61–1.24)
Chloride: 102 mmol/L (ref 101–111)
GFR calc Af Amer: >60 mL/min (ref >60)
GLUCOSE: 86 mg/dL (ref 65–99)
POTASSIUM: 3.9 mmol/L (ref 3.5–5.1)
Sodium: 139 mmol/L (ref 135–145)

## 2015-08-01 LAB — CBC
HEMATOCRIT: 36.2 % — AB (ref 39.0–52.0)
Hemoglobin: 12.5 g/dL — ABNORMAL LOW (ref 13.0–17.0)
MCH: 32.4 pg (ref 26.0–34.0)
MCHC: 34.5 g/dL (ref 30.0–36.0)
MCV: 93.8 fL (ref 78.0–100.0)
PLATELETS: 381 10*3/uL (ref 150–400)
RBC: 3.86 MIL/uL — ABNORMAL LOW (ref 4.22–5.81)
RDW: 18.3 % — AB (ref 11.5–15.5)
WBC: 6.5 10*3/uL (ref 4.0–10.5)

## 2015-08-01 MED ORDER — TEMAZEPAM 15 MG PO CAPS
30.0000 mg | ORAL_CAPSULE | Freq: Every evening | ORAL | Status: DC | PRN
  Administered 2015-08-01: 30 mg via ORAL
  Filled 2015-08-01: qty 2

## 2015-08-01 MED ORDER — ENOXAPARIN SODIUM 40 MG/0.4ML ~~LOC~~ SOLN
40.0000 mg | SUBCUTANEOUS | Status: DC
  Administered 2015-08-01 – 2015-08-05 (×5): 40 mg via SUBCUTANEOUS
  Filled 2015-08-01 (×5): qty 0.4

## 2015-08-01 MED ORDER — OXYCODONE HCL 20 MG/ML PO CONC
5.0000 mg | ORAL | Status: DC | PRN
  Administered 2015-08-01 – 2015-08-02 (×3): 5 mg via SUBLINGUAL
  Filled 2015-08-01 (×3): qty 1

## 2015-08-01 MED ORDER — ENSURE ENLIVE PO LIQD
237.0000 mL | Freq: Three times a day (TID) | ORAL | Status: DC
  Administered 2015-08-01 – 2015-08-05 (×9): 237 mL via ORAL

## 2015-08-01 MED ORDER — ENSURE ENLIVE PO LIQD
237.0000 mL | Freq: Two times a day (BID) | ORAL | Status: DC
  Administered 2015-08-01: 237 mL via ORAL

## 2015-08-01 MED ORDER — CHLORHEXIDINE GLUCONATE 0.12 % MT SOLN
15.0000 mL | Freq: Two times a day (BID) | OROMUCOSAL | Status: DC
  Administered 2015-08-01 – 2015-08-06 (×8): 15 mL via OROMUCOSAL
  Filled 2015-08-01 (×9): qty 15

## 2015-08-01 MED ORDER — PANTOPRAZOLE SODIUM 40 MG PO TBEC
40.0000 mg | DELAYED_RELEASE_TABLET | Freq: Every day | ORAL | Status: DC
  Administered 2015-08-04 – 2015-08-06 (×2): 40 mg via ORAL
  Filled 2015-08-01 (×3): qty 1

## 2015-08-01 MED ORDER — PANTOPRAZOLE SODIUM 40 MG IV SOLR
40.0000 mg | INTRAVENOUS | Status: DC
  Administered 2015-08-01: 40 mg via INTRAVENOUS
  Filled 2015-08-01: qty 40

## 2015-08-01 MED ORDER — CETYLPYRIDINIUM CHLORIDE 0.05 % MT LIQD
7.0000 mL | Freq: Two times a day (BID) | OROMUCOSAL | Status: DC
  Administered 2015-08-02 – 2015-08-06 (×5): 7 mL via OROMUCOSAL

## 2015-08-01 NOTE — Progress Notes (Signed)
Initial Nutrition Assessment  DOCUMENTATION CODES:   Not applicable  INTERVENTION:   -Continue Ensure Enlive po TID, each supplement provides 350 kcal and 20 grams of protein -Magic Cup TID with meals -Consider SLP evaluation for swallow safety  NUTRITION DIAGNOSIS:   Inadequate oral intake related to dysphagia as evidenced by meal completion < 25%.  GOAL:   Patient will meet greater than or equal to 90% of their needs  MONITOR:   PO intake, Supplement acceptance, Labs, Weight trends, Skin, I & O's  REASON FOR ASSESSMENT:   Malnutrition Screening Tool, Consult Assessment of nutrition requirement/status  ASSESSMENT:   [name] [name] is a 81 y.o. male with a past medical history significant for chronic dysphagia, myasthenia gravis, RA, alcoholic cirrhosis, chronic systolic and diastolic CHF (EF 95% in 0/9326), HTN, COPD, aFib and complete heart block with pacemaker, not on warfarin reportedly because of fall risk who presents with increasing pain with swallowing.  Pt admitted with intractable odynophagia.   Pt unavailable at time of visit. Spoke and reviewed case with RN. RN reports pt continues to have poor intake and pain with swallowing. Pt has difficulty consuming pills in applesauce; RN reports pt grimaces and complains of sensation of food "getting stuck".   Reviewed MD notes, which revealed ongoing poor po intake due to painful swallowing for 2 months (pt had esophageal dilation one year ago, which helped withh dysphagia until two months ago). Wt hx reveals a 28# (21.5%) wt loss x 12 months. Pt does not want to pursue TF at this time.  RD suspects malnutrition, however, unable to identify at this time.  Palliative care consult pending regarding goals of care.  Unable to complete Nutrition-Focused physical exam at this time.   Labs reviewed.  Diet Order:  DIET - DYS 1 Room service appropriate?: Yes; Fluid consistency:: Thin  Skin:  Reviewed, no issues  Last BM:   07/31/15  Height:   Ht Readings from Last 1 Encounters:  07/31/15 5\' 10"  (1.778 m)    Weight:   Wt Readings from Last 1 Encounters:  07/31/15 130 lb 14.4 oz (59.376 kg)    Ideal Body Weight:  78.2 kg  BMI:  Body mass index is 18.78 kg/(m^2).  Estimated Nutritional Needs:   Kcal:  1600-1800  Protein:  70-85 grams  Fluid:  1.6-1.8 L  EDUCATION NEEDS:   No education needs identified at this time  Asalee Barrette A. Jimmye Norman, RD, LDN, CDE Pager: [phone] After hours Pager: [phone]

## 2015-08-01 NOTE — Evaluation (Signed)
Clinical/Bedside Swallow Evaluation Patient Details  Name: [name] [name] MRN: [id number] Date of Birth: January 01, 1929  Today's Date: 08/01/2015 Time: SLP Start Time (ACUTE ONLY): 1329 SLP Stop Time (ACUTE ONLY): 1345 SLP Time Calculation (min) (ACUTE ONLY): 16 min  Past Medical History:  Past Medical History  Diagnosis Date  . Complete heart block (HCC)     s/p PPM  . PAF (paroxysmal atrial fibrillation) ([place])   . HTN (hypertension)   . HLD (hyperlipidemia)   . CAD (coronary artery disease)   . Depression   . Anxiety   . Hemorrhoids, external   . GERD (gastroesophageal reflux disease)   . Hyperplastic colonic polyp   . Diverticulosis of colon   . COPD (chronic obstructive pulmonary disease) ([place])   . Myasthenia gravis   . Arthritis, rheumatoid ([place])   . BPH (benign prostatic hypertrophy)   . Cirrhosis of liver ([place])   . Asthma   . Alcohol abuse   . Hiatal hernia   . Skin cancer of forehead     and left ear  . Lung nodule   . Nephrolithiasis   . CHF (congestive heart failure) ([place])   . Pacemaker     Medtronic  . Herpes zoster   . Hoarding disorder with poor insight   . Esophageal stricture    Past Surgical History:  Past Surgical History  Procedure Laterality Date  . Total knee arthroplasty      bilateral  . Total hip arthroplasty      x3  . Splenectomy    . Appendectomy    . Pacemaker insertion  2009    medtronic  . Cardiac catheterization  10/12/2007    EF 60%  . Inguinal hernia repair    . Esophagogastroduodenoscopy N/A 09/23/2014    Procedure: ESOPHAGOGASTRODUODENOSCOPY (EGD);  Surgeon: Ladene Artist, MD;  Location: [facility];  Service: Endoscopy;  Laterality: N/A;  . Savory dilation N/A 09/23/2014    Procedure: SAVORY DILATION;  Surgeon: Ladene Artist, MD;  Location: [facility];  Service: Endoscopy;  Laterality: N/A;  . Skin cancer excision     HPI:  [name] [name] is a 81 y.o. male with a past medical history significant for chronic dysphagia,  myasthenia gravis, RA, alcoholic cirrhosis, chronic systolic and diastolic CHF (EF 22% in 06/7988), HTN, COPD, aFib and complete heart block with pacemaker, not on warfarin reportedly because of fall risk who presents with increasing pain with swallowing.   Assessment / Plan / Recommendation Clinical Impression  Pt has facial grimacing and frequent throat clearing throughout PO intake, although presentation appears similar to reported function in 05/2015 when pt had FEES completed. At that time, pt had trace amounts of penetration that cleared with throat clearing. CXR without indication of acute infection, and pt believes he has less pain with change to puree diet. Would continue with Dys 1 diet and thin liquids for now, with SLP f/u for tolerance.     Aspiration Risk  Mild    Diet Recommendation Dysphagia 1 (Puree);Thin   Medication Administration: Other (Comment) (crushed in puree or liquid form, or alternative means) Compensations: Slow rate;Small sips/bites;Clear throat intermittently    Other  Recommendations Oral Care Recommendations: Oral care BID   Follow Up Recommendations   tba    Frequency and Duration min 2x/week  2 weeks   Pertinent Vitals/Pain Facial grimacing with swallowing, monitored during session    SLP Swallow Goals     Swallow Study Prior Functional Status  General Other Pertinent Information: [name] [name] is a 81 y.o. male with a past medical history significant for chronic dysphagia, myasthenia gravis, RA, alcoholic cirrhosis, chronic systolic and diastolic CHF (EF 54% in 03/5680), HTN, COPD, aFib and complete heart block with pacemaker, not on warfarin reportedly because of fall risk who presents with increasing pain with swallowing. Type of Study: Bedside swallow evaluation Previous Swallow Assessment: FEES 05/2015 showed trace penetration of thin liquids that cleared with throat clear Diet Prior to this Study: Dysphagia 1 (puree);Thin  liquids Temperature Spikes Noted: No Respiratory Status: Room air History of Recent Intubation: No Behavior/Cognition: Alert;Cooperative;Pleasant mood Oral Cavity - Dentition: Edentulous Self-Feeding Abilities: Able to feed self Patient Positioning: Upright in bed Baseline Vocal Quality: Normal Volitional Cough: Strong    Oral/Motor/Sensory Function Overall Oral Motor/Sensory Function: Appears within functional limits for tasks assessed   Ice Chips Ice chips: Not tested   Thin Liquid Thin Liquid: Impaired Presentation: Cup;Self Fed Pharyngeal  Phase Impairments: Throat Clearing - Immediate;Throat Clearing - Delayed    Nectar Thick Nectar Thick Liquid: Within functional limits   Honey Thick Honey Thick Liquid: Not tested   Puree Puree: Impaired Presentation: Self Fed;Spoon Pharyngeal Phase Impairments: Throat Clearing - Immediate;Throat Clearing - Delayed   Solid   GO Functional Assessment Tool Used: skilled clinical judgment Functional Limitations: Swallowing Swallow Current Status (E7517): At least 20 percent but less than 40 percent impaired, limited or restricted Swallow Goal Status [phone]): At least 1 percent but less than 20 percent impaired, limited or restricted  Solid: Not tested        [name] [name], M.A. CCC-SLP [phone]  [name] [name] 08/01/2015,2:08 PM

## 2015-08-01 NOTE — Progress Notes (Signed)
[facility] PROGRESS NOTE  [name] [name] LZJ:[id number] DOB: 01-28-1929 DOA: 07/31/2015 PCP: [name], MD  Assessment/Plan:  Principal Problem:   Odynophagia: had recent endoscopy, reported laryngoscopy with ENT, speech evaluation, imaging, rx with PPI and nystatin, all without improvement or explanation of pain/dysphagia. I tried contacting Dr. Haynes Kerns who is out of the office. Have asked for records to be faxed as soon as possible. It is not clear to me what is causing this. He has a history of myasthenia gravis but shouldn't have pain with swallowing. Unclear what is the etiology. Patient has lost over 20 pounds as he is unable to eat. I discussed feeding tube with patient either short-term or pain and he is not interested. Fentanyl is not helping his pain. Will try Roxicodone. Palliative care consult is pending for symptom management and goals of care. Left message with their answering machine. Patient having difficulty swallowing even crushed pills. Will stop all nonessential medications and give IV or liquid when able. Change diet to. A. Patient reports he is able to take in sure. We'll order 3 times a day. Active Problems:   Rheumatoid arthritis ([place])   Myasthenia gravis ([place])   Essential hypertension, benign   Complete heart block (HCC)   Atrial fibrillation (HCC)   CAD (coronary artery disease)   Chronic combined systolic and diastolic congestive heart failure (HCC)   COPD (chronic obstructive pulmonary disease) (HCC)   Anemia due to other cause   Protein-calorie malnutrition, severe (HCC)   Failure to thrive in adult Weakness: Will get physical therapy evaluation  Code Status:  DO NOT RESUSCITATE Family Communication:  None at bedside. Patient is lucid. Disposition Plan:  home  Consultants:  Palliative medicine  Procedures:     Antibiotics:    HPI/Subjective: No improvement in odynophagia or dysphasia. Frustrated about his lack of diagnosis. Fentanyl not  helping.  Objective: Filed Vitals:   08/01/15 0436  BP: 144/74  Pulse: 70  Temp: 98.4 F (36.9 C)  Resp: 14    Intake/Output Summary (Last 24 hours) at 08/01/15 1052 Last data filed at 08/01/15 0857  Gross per 24 hour  Intake 443.33 ml  Output    200 ml  Net 243.33 ml   Filed Weights   07/31/15 2358  Weight: 59.376 kg (130 lb 14.4 oz)    Exam:   General:  Cachectic elderly male alert and oriented.  HEENT: Moist mucous membranes. Oropharynx clear. Mild erythema noted right soft palate  Neck: Supple no mass noted  Cardiovascular: Regular rate rhythm without murmurs gallops rubs  Respiratory: Clear to auscultation bilaterally without wheezes rhonchi or rales  Abdomen: Soft nontender nondistended  Ext: No clubbing cyanosis or edema  Basic Metabolic Panel:  Recent Labs Lab 07/31/15 1454 08/01/15 0350  NA 136 139  K 4.4 3.9  CL 97* 102  CO2 27 25  GLUCOSE 153* 86  BUN 20 18  CREATININE 0.78 0.72  CALCIUM 9.2 8.8*   Liver Function Tests:  Recent Labs Lab 07/31/15 1454  AST 30  ALT 13*  ALKPHOS 108  BILITOT 0.6  PROT 7.5  ALBUMIN 2.8*    Recent Labs Lab 07/31/15 1454  LIPASE 37    Recent Labs Lab 07/31/15 1601  AMMONIA 31   CBC:  Recent Labs Lab 07/31/15 1454 08/01/15 0350  WBC 6.6 6.5  NEUTROABS 3.8  --   HGB 14.0 12.5*  HCT 40.1 36.2*  MCV 93.5 93.8  PLT 399 381   Cardiac Enzymes: No results for  input(s): CKTOTAL, CKMB, CKMBINDEX, TROPONINI in the last 168 hours. BNP (last 3 results)  Recent Labs  11/25/14 1732 06/13/15 0318  BNP 660.0* 461.2*    ProBNP (last 3 results) No results for input(s): PROBNP in the last 8760 hours.  CBG: No results for input(s): GLUCAP in the last 168 hours.  No results found for this or any previous visit (from the past 240 hour(s)).   Studies: Ct Abdomen Pelvis W Contrast  07/31/2015   CLINICAL DATA:  Diffuse abdominal pain, malaise, weight loss  EXAM: CT ABDOMEN AND PELVIS WITH  CONTRAST  TECHNIQUE: Multidetector CT imaging of the abdomen and pelvis was performed using the standard protocol following bolus administration of intravenous contrast.  CONTRAST:  14mL OMNIPAQUE IOHEXOL 300 MG/ML  SOLN  COMPARISON:  02/05/2013  FINDINGS: Lower chest: Consolidation inferior aspect of the right middle lobe most consistent with atelectasis. Calcification inferior right hilum measuring about 1 cm suggesting calcified adenopathy. Focus of parenchymal calcification posterior lateral right lung base measuring about 1 cm, stable when compared to prior study. Mitral valve calcification. Coronary artery calcification. Pacer leads noted. Small partially loculated right pleural effusions similar to prior study. Cardiac enlargement.  Hepatobiliary: Mottled enhancement pattern in the liver with relatively decreased enhancement peripherally seen on the portal phase, less apparent on delayed images. This is consistent with hepatic venous congestion likely of cardiac origin.Mildly nodular hepatic contour suggesting cirrhosis likely also related to the hepatic parenchymal heterogeneity.  Pancreas: Negative  Spleen: Absent  Adrenals/Urinary Tract: No masses identified. No evidence of hydronephrosis.  Stomach/Bowel: There is significant diverticulosis of the sigmoid colon, which is too large degree obscured by beam attenuation artifact from left hip replacement. There is wall thickening and narrowing of ascending and proximal transverse colon suggesting possibility of chronic inflammation. Nonobstructive bowel gas pattern present.  Vascular/Lymphatic: Mild to moderate atherosclerotic calcification of the aortoiliac vessels.  Reproductive: No mass or other significant abnormality.  Other: There may be a small volume of free fluid in the pelvis as seen to the right side posterior to the bladder on image number 67. Limited evaluation of this area due to beam attenuation artifact from left femur.  Musculoskeletal: Left  hip replacement causes significant beam attenuation artifact, limiting this study. Chronic changes left acetabulum. Severe degenerative change right hip joint.  IMPRESSION: Images of the inferior pelvis limited by beam hardening artifact arising from left hip replacement.  Diverticulosis sigmoid colon, with no diverticulitis appreciated allowing for limitations discussed above.  Trace ascites in the pelvis, cause uncertain. It is possible that it may be due to fluid overload, as there is cardiac enlargement and the appearance of the liver suggests venous congestion. Alternatively it may be related to cirrhosis, which is suspected based on liver contour.  Evidence to suggest chronic inflammation of the proximal half of the colon.   Electronically Signed   By: Skipper Cliche M.D.   On: 07/31/2015 19:33   Dg Chest Portable 1 View  07/31/2015   CLINICAL DATA:  Weakness. Hypertension. Coronary artery disease. COPD.  EXAM: PORTABLE CHEST 1 VIEW  COMPARISON:  07/05/2015  FINDINGS: Reverse lordotic projection. Dual lead pacer, proximal and distal leads projecting over the right atrium and ventricle, respectively. Mild enlargement of the cardiopericardial silhouette, without edema. Blunting of the right lateral costophrenic angle compatible small right pleural effusion with passive atelectasis.  Calcified right lower lobe pulmonary nodules, one near the lung base and the other near the minor fissure, previously worked up by chest CT  in 2008 and felt to be benign.  Degenerative glenohumeral arthropathy bilaterally with loss of articular space and spurring of the humeral heads.  Atherosclerotic calcification of the aortic arch.  IMPRESSION: 1. Mild enlargement of the cardiopericardial silhouette, without edema. 2. Small right pleural effusion with passive atelectasis. 3. Right lower lobe calcified granulomas, benign. 4. Atherosclerotic aortic arch 5. Degenerative glenohumeral arthropathy.   Electronically Signed   By:  Van Clines M.D.   On: 07/31/2015 15:32    Scheduled Meds: . aspirin EC  81 mg Oral Daily  . azaTHIOprine  50 mg Oral BID  . budesonide-formoterol  2 puff Inhalation BID  . escitalopram  20 mg Oral Daily  . feeding supplement (ENSURE ENLIVE)  237 mL Oral BID BM  . furosemide  40 mg Oral Daily  . pantoprazole  40 mg Oral Daily  . spironolactone  50 mg Oral Daily  . temazepam  30 mg Oral QHS   Continuous Infusions: . sodium chloride 100 mL/hr at 07/31/15 2346    Time spent: 25 minutes  [facility] [email or web address], password [facility] 08/01/2015, 10:52 AM

## 2015-08-01 NOTE — Telephone Encounter (Signed)
All questions answered for the daughter.  He is currently admitted.

## 2015-08-01 NOTE — Progress Notes (Signed)
PT Cancellation Note  Patient Details Name: [name] [name] MRN: [id number] DOB: [birth year]   Cancelled Treatment:    Reason Eval/Treat Not Completed: Fatigue/lethargy limiting ability to participate; patient preparing for a nap.  Reports was up walking earlier with nurse tech.  Agreeable to PT evaluation tomorrow.  Spoke privately with daughter at her request.  She reports patient lives alone in unsafe environment and is verbally abusive to her and she feels he is attempting to kill himself as not eating, drinking and lying in bed in urine.  She is requesting assistance for his care.  Spoke with Education officer, museum who reports if patient alert and oriented she cannot speak with daughter unless he agrees.  Also that patient is observation patient and would not be able to go to SNF unless family can private pay.  Will attempt to see patient tomorrow.   [name] [name],CYNDI 08/01/2015, 2:38 PM  Magda Kiel, [place] 08/01/2015

## 2015-08-02 MED ORDER — CLONAZEPAM 0.5 MG PO TBDP
0.5000 mg | ORAL_TABLET | Freq: Two times a day (BID) | ORAL | Status: DC
  Administered 2015-08-02 – 2015-08-06 (×9): 0.5 mg via ORAL
  Filled 2015-08-02 (×8): qty 1

## 2015-08-02 MED ORDER — AZELASTINE HCL 0.1 % NA SOLN
2.0000 | Freq: Two times a day (BID) | NASAL | Status: DC
  Administered 2015-08-02 – 2015-08-05 (×5): 2 via NASAL
  Filled 2015-08-02: qty 30

## 2015-08-02 MED ORDER — PREGABALIN 25 MG PO CAPS
25.0000 mg | ORAL_CAPSULE | Freq: Two times a day (BID) | ORAL | Status: DC
  Administered 2015-08-02 – 2015-08-06 (×8): 25 mg via ORAL
  Filled 2015-08-02 (×8): qty 1

## 2015-08-02 MED ORDER — MIRTAZAPINE 15 MG PO TBDP
15.0000 mg | ORAL_TABLET | Freq: Every day | ORAL | Status: DC
  Administered 2015-08-02 – 2015-08-03 (×2): 15 mg via ORAL
  Filled 2015-08-02 (×3): qty 1

## 2015-08-02 MED ORDER — FENTANYL 50 MCG/HR TD PT72
50.0000 ug | MEDICATED_PATCH | TRANSDERMAL | Status: DC
  Administered 2015-08-02: 50 ug via TRANSDERMAL
  Filled 2015-08-02: qty 1

## 2015-08-02 MED ORDER — GI COCKTAIL ~~LOC~~
30.0000 mL | Freq: Three times a day (TID) | ORAL | Status: DC
  Administered 2015-08-02 – 2015-08-06 (×7): 30 mL via ORAL
  Filled 2015-08-02 (×15): qty 30

## 2015-08-02 MED ORDER — CROMOLYN SODIUM 5.2 MG/ACT NA AERS
1.0000 | INHALATION_SPRAY | Freq: Three times a day (TID) | NASAL | Status: DC
  Filled 2015-08-02: qty 13

## 2015-08-02 NOTE — Progress Notes (Signed)
PT Cancellation Note  Patient Details Name: [name] [name] MRN: [id number] DOB: 1929-06-01   Cancelled Treatment:    Reason Eval/Treat Not Completed: Patient and daughter refusing PT at this time. Informed that we will check back tomorrow.    Cassell Clement, PT, CSCS Pager [phone] Office [phone]  08/02/2015, 2:08 PM

## 2015-08-02 NOTE — Progress Notes (Signed)
Speech Language Pathology Treatment: Dysphagia  Patient Details Name: [name] [name] MRN: [id number] DOB: Aug 10, 1929 Today's Date: 08/02/2015 Time: 1165-7903 SLP Time Calculation (min) (ACUTE ONLY): 23 min  Assessment / Plan / Recommendation Clinical Impression  Pt had inconsistent performance throughout session today. Initially, pt agreeable to sips of water, reporting that cold drinks are the most soothing. Pt had facial grimacing and multiple swallows, but no overt signs of airway compromise. After SLP acknowledged pt's improved performance, pt attempted liquid medications, followed by sips of water, which all resulted in immediate coughing and wet vocal quality.   Pt is clear that he does not want alternative means of nutrition, and he declines thicker textures (even Ensure, which is not quite nectar-thick)  as he insists that they are more painful to swallow. Therefore, would continue with current diet with strict aspiration precautions and small, single sips to attempt to mitigate aspiration risk. Of note, pt had rapidly changing mood, alternating between cheerful/laughing, to tearful, back and forth throughout session.   HPI Other Pertinent Information: [name] [name] is a 81 y.o. male with a past medical history significant for chronic dysphagia, myasthenia gravis, RA, alcoholic cirrhosis, chronic systolic and diastolic CHF (EF 83% in 12/3830), HTN, COPD, aFib and complete heart block with pacemaker, not on warfarin reportedly because of fall risk who presents with increasing pain with swallowing.   Pertinent Vitals Pain Assessment: 0-10 Pain Score: 8  Pain Location: right side of neck Pain Descriptors / Indicators: Burning Pain Intervention(s): Patient requesting pain meds-RN notified;RN gave pain meds during session;Limited activity within patient's tolerance  SLP Plan  Continue with current plan of care    Recommendations Diet recommendations: Dysphagia 1 (puree);Thin liquid Liquids  provided via: Cup;Straw Medication Administration: Other (Comment) (liquid form if possible, otherwise crushed in puree) Supervision: Patient able to self feed;Intermittent supervision to cue for compensatory strategies Compensations: Slow rate;Small sips/bites;Clear throat intermittently Postural Changes and/or Swallow Maneuvers: Seated upright 90 degrees       Oral Care Recommendations: Oral care BID Follow up Recommendations: Home health SLP Plan: Continue with current plan of care    [name] [name], M.A. CCC-SLP [phone]  [name] [name] 08/02/2015, 3:33 PM

## 2015-08-02 NOTE — Progress Notes (Signed)
[facility] PROGRESS NOTE  [name] [name] NVB:[id number] DOB: 17-Jun-1929 DOA: 07/31/2015 PCP: [name], MD  Assessment/Plan:  Principal Problem:   Odynophagia: had recent endoscopy, reported laryngoscopy with ENT, speech evaluation, imaging, rx with PPI and nystatin, all without improvement or explanation of pain/dysphagia. D/w Dr. Joylene Draft and reviewed last office note. He had planned on referring to neurosurgery. Had CT neck last month. D/w neurology. DDD present, but no spur big enough to impact swallow.  Cant get MRI due to pacemaker.  Nevertheless, not a candidate for spinal surgery if this is the etiology. Carotodynia?  Appreciate Dr. Cornelia Copa assistance for pain management and GOC. Pt does not want feeding tube. Would be hospice appropriate. Active Problems:   Rheumatoid arthritis ([place])   Myasthenia gravis ([place])   Essential hypertension, benign   Complete heart block (HCC)   Atrial fibrillation (HCC)   CAD (coronary artery disease)   Chronic combined systolic and diastolic congestive heart failure (HCC)   COPD (chronic obstructive pulmonary disease) (HCC)   Anemia due to other cause   Protein-calorie malnutrition, severe (HCC)   Failure to thrive in adult Weakness: PT ordered. Pt declines.  Code Status:  DO NOT RESUSCITATE Family Communication:  Daughter at bedside Disposition Plan:    Consultants:  Palliative medicine  Procedures:     Antibiotics:    HPI/Subjective: No improvement in pain. Able to take some puree today. Frustrated.  Objective: Filed Vitals:   08/02/15 0507  BP: 142/72  Pulse: 66  Temp: 98.4 F (36.9 C)  Resp: 19    Intake/Output Summary (Last 24 hours) at 08/02/15 1419 Last data filed at 08/02/15 0900  Gross per 24 hour  Intake    940 ml  Output   1080 ml  Net   -140 ml   Filed Weights   07/31/15 2358  Weight: 59.376 kg (130 lb 14.4 oz)    Exam:   General:  Cachectic elderly male alert and oriented.  HEENT: Moist  mucous membranes. Oropharynx clear. Mild erythema noted right soft palate  Neck: Supple no mass noted. Tender to palpation  Cardiovascular: Regular rate rhythm without murmurs gallops rubs  Respiratory: Clear to auscultation bilaterally without wheezes rhonchi or rales  Abdomen: Soft nontender nondistended  Ext: No clubbing cyanosis or edema  Basic Metabolic Panel:  Recent Labs Lab 07/31/15 1454 08/01/15 0350  NA 136 139  K 4.4 3.9  CL 97* 102  CO2 27 25  GLUCOSE 153* 86  BUN 20 18  CREATININE 0.78 0.72  CALCIUM 9.2 8.8*   Liver Function Tests:  Recent Labs Lab 07/31/15 1454  AST 30  ALT 13*  ALKPHOS 108  BILITOT 0.6  PROT 7.5  ALBUMIN 2.8*    Recent Labs Lab 07/31/15 1454  LIPASE 37    Recent Labs Lab 07/31/15 1601  AMMONIA 31   CBC:  Recent Labs Lab 07/31/15 1454 08/01/15 0350  WBC 6.6 6.5  NEUTROABS 3.8  --   HGB 14.0 12.5*  HCT 40.1 36.2*  MCV 93.5 93.8  PLT 399 381   Cardiac Enzymes: No results for input(s): CKTOTAL, CKMB, CKMBINDEX, TROPONINI in the last 168 hours. BNP (last 3 results)  Recent Labs  11/25/14 1732 06/13/15 0318  BNP 660.0* 461.2*    ProBNP (last 3 results) No results for input(s): PROBNP in the last 8760 hours.  CBG: No results for input(s): GLUCAP in the last 168 hours.  No results found for this or any previous visit (from the past 240 hour(s)).  Studies: Ct Abdomen Pelvis W Contrast  07/31/2015  CLINICAL DATA:  Diffuse abdominal pain, malaise, weight loss EXAM: CT ABDOMEN AND PELVIS WITH CONTRAST TECHNIQUE: Multidetector CT imaging of the abdomen and pelvis was performed using the standard protocol following bolus administration of intravenous contrast. CONTRAST:  129mL OMNIPAQUE IOHEXOL 300 MG/ML  SOLN COMPARISON:  02/05/2013 FINDINGS: Lower chest: Consolidation inferior aspect of the right middle lobe most consistent with atelectasis. Calcification inferior right hilum measuring about 1 cm suggesting  calcified adenopathy. Focus of parenchymal calcification posterior lateral right lung base measuring about 1 cm, stable when compared to prior study. Mitral valve calcification. Coronary artery calcification. Pacer leads noted. Small partially loculated right pleural effusions similar to prior study. Cardiac enlargement. Hepatobiliary: Mottled enhancement pattern in the liver with relatively decreased enhancement peripherally seen on the portal phase, less apparent on delayed images. This is consistent with hepatic venous congestion likely of cardiac origin.Mildly nodular hepatic contour suggesting cirrhosis likely also related to the hepatic parenchymal heterogeneity. Pancreas: Negative Spleen: Absent Adrenals/Urinary Tract: No masses identified. No evidence of hydronephrosis. Stomach/Bowel: There is significant diverticulosis of the sigmoid colon, which is too large degree obscured by beam attenuation artifact from left hip replacement. There is wall thickening and narrowing of ascending and proximal transverse colon suggesting possibility of chronic inflammation. Nonobstructive bowel gas pattern present. Vascular/Lymphatic: Mild to moderate atherosclerotic calcification of the aortoiliac vessels. Reproductive: No mass or other significant abnormality. Other: There may be a small volume of free fluid in the pelvis as seen to the right side posterior to the bladder on image number 67. Limited evaluation of this area due to beam attenuation artifact from left femur. Musculoskeletal: Left hip replacement causes significant beam attenuation artifact, limiting this study. Chronic changes left acetabulum. Severe degenerative change right hip joint. IMPRESSION: Images of the inferior pelvis limited by beam hardening artifact arising from left hip replacement. Diverticulosis sigmoid colon, with no diverticulitis appreciated allowing for limitations discussed above. Trace ascites in the pelvis, cause uncertain. It is  possible that it may be due to fluid overload, as there is cardiac enlargement and the appearance of the liver suggests venous congestion. Alternatively it may be related to cirrhosis, which is suspected based on liver contour. Evidence to suggest chronic inflammation of the proximal half of the colon. Electronically Signed   By: Skipper Cliche M.D.   On: 07/31/2015 19:33   Dg Chest Portable 1 View  07/31/2015  CLINICAL DATA:  Weakness. Hypertension. Coronary artery disease. COPD. EXAM: PORTABLE CHEST 1 VIEW COMPARISON:  07/05/2015 FINDINGS: Reverse lordotic projection. Dual lead pacer, proximal and distal leads projecting over the right atrium and ventricle, respectively. Mild enlargement of the cardiopericardial silhouette, without edema. Blunting of the right lateral costophrenic angle compatible small right pleural effusion with passive atelectasis. Calcified right lower lobe pulmonary nodules, one near the lung base and the other near the minor fissure, previously worked up by chest CT in 2008 and felt to be benign. Degenerative glenohumeral arthropathy bilaterally with loss of articular space and spurring of the humeral heads. Atherosclerotic calcification of the aortic arch. IMPRESSION: 1. Mild enlargement of the cardiopericardial silhouette, without edema. 2. Small right pleural effusion with passive atelectasis. 3. Right lower lobe calcified granulomas, benign. 4. Atherosclerotic aortic arch 5. Degenerative glenohumeral arthropathy. Electronically Signed   By: Van Clines M.D.   On: 07/31/2015 15:32    Scheduled Meds: . antiseptic oral rinse  7 mL Mouth Rinse q12n4p  . azaTHIOprine  50 mg Oral BID  .  azelastine  2 spray Each Nare BID  . budesonide-formoterol  2 puff Inhalation BID  . chlorhexidine  15 mL Mouth Rinse BID  . clonazepam  0.5 mg Oral BID  . enoxaparin (LOVENOX) injection  40 mg Subcutaneous Q24H  . feeding supplement (ENSURE ENLIVE)  237 mL Oral TID BM  . fentaNYL  50  mcg Transdermal Q72H  . furosemide  40 mg Oral Daily  . gi cocktail  30 mL Oral TID  . mirtazapine  15 mg Oral QHS  . pantoprazole  40 mg Oral Daily  . pregabalin  25 mg Oral BID   Continuous Infusions: . sodium chloride 100 mL/hr at 08/01/15 1945    Time spent: 25 minutes  [facility] [email or web address], password [facility] 08/02/2015, 2:19 PM  LOS: 1 day

## 2015-08-02 NOTE — Progress Notes (Signed)
PT Cancellation Note  Patient Details Name: [name] [name] MRN: [id number] DOB: Apr 06, 1929   Cancelled Treatment:    Reason Eval/Treat Not Completed: Patient declined, eating lunch. Discussed with patient plan to return and attempt at a later time. Patient in agreement.   Cassell Clement, PT, CSCS Pager [phone] Office [phone]  08/02/2015, 1:23 PM

## 2015-08-02 NOTE — Consult Note (Signed)
I met with [name] [name] and his daughter [name] and reviewed his chart in extensive detail. This is a very complicated situation on multiple levels- psychosocial, psychiatric, and medical. My assessment is that [name] needs a formal capacity evaluation- he had an odd affect and seemed to be providing unusual information and not fully answering the questions or diverting/confabulating answers. His daughter has a completely different view of his world and who her father is -she also has very unusual concerns about his condition.   I had a very frank conversation with him about his condition- and also about the diagnostic uncertainty. I essentially offered him two paths- we could go the hospice route with comfort, QOL and dignity being our goal with a primary focus on pain relief or the alternative which would likely include more aggressive or invasive medical interventions and testing and probably lead to him needing SNF or having an acute worsening of what is likely early vascular dementia or similar. I was also clear about how serious his condition was and that he was likely going to die from the complications of what he is experiencing.   Overall [name] tells me he continues to want answers- he is briefly tearful. He wants pain control first and foremost- he and his daughter are going to discuss the issues and options I presented today. He is clear about DNR and no feeding tubes.  Symptom Recs:  1. His pain is very neuropathic- question Carotidynia?Tender over the carotids - suspect there is a component of occipital neuralgia involved along with cranio-facial pain syndrome and Osteoarthritis and severe DJD in his neck and thoracic spine. His characterization of his drinking alcohol and his daughters description are completely different.  TID GI Cocktail (lidocaine, donnatal, Maalox)- if helps suspect local mucosal source of pain.  Start Lyrica 50 BID (starting dose and titrate)  Stop Lexapro and Start  QHS Mirtazapine  Discontinue Morphine and Oxycodone- Started Fentanyl Patch at 56mg/hr with PRN IV bous- will titrate patch- I suspect he has a very high tolerance past history of opiate abuse- he may actually be a good candidate for methadone- but we will see how this regimen works for him for now.  Klonopin BID   Nasal Crom (Mast cell stabilizer-helpful with posterior pharyngeal mucositis/irritation)   (ordered all disintegrating tablets or liquids when possible)   His goals of care will need to evolve- he is still driving which is very concerning and his daughter reports his house is in terrible condition- may need to get APS involved if they are not already- especially if he opts to go home- they are thing about options.I will follow up tomorrow.  ELane Hacker DO Palliative Medicine

## 2015-08-03 DIAGNOSIS — R52 Pain, unspecified: Secondary | ICD-10-CM

## 2015-08-03 DIAGNOSIS — Z515 Encounter for palliative care: Secondary | ICD-10-CM

## 2015-08-03 MED ORDER — OLANZAPINE 5 MG PO TBDP
5.0000 mg | ORAL_TABLET | Freq: Every day | ORAL | Status: DC
  Administered 2015-08-03 – 2015-08-06 (×4): 5 mg via ORAL
  Filled 2015-08-03 (×8): qty 1

## 2015-08-03 MED ORDER — FLUCONAZOLE IN SODIUM CHLORIDE 100-0.9 MG/50ML-% IV SOLN
100.0000 mg | INTRAVENOUS | Status: DC
  Administered 2015-08-03 – 2015-08-05 (×3): 100 mg via INTRAVENOUS
  Filled 2015-08-03 (×4): qty 50

## 2015-08-03 MED ORDER — FENTANYL CITRATE (PF) 100 MCG/2ML IJ SOLN
15.0000 ug | Freq: Once | INTRAMUSCULAR | Status: AC
  Administered 2015-08-03: 15 ug via INTRAVENOUS
  Filled 2015-08-03: qty 2

## 2015-08-03 MED ORDER — NITROGLYCERIN 0.4 MG SL SUBL
0.4000 mg | SUBLINGUAL_TABLET | SUBLINGUAL | Status: DC | PRN
  Administered 2015-08-03: 0.4 mg via SUBLINGUAL
  Filled 2015-08-03: qty 1

## 2015-08-03 MED ORDER — FENTANYL 50 MCG/HR TD PT72
75.0000 ug | MEDICATED_PATCH | TRANSDERMAL | Status: DC
  Administered 2015-08-03 – 2015-08-06 (×2): 75 ug via TRANSDERMAL
  Filled 2015-08-03 (×6): qty 1

## 2015-08-03 NOTE — Progress Notes (Signed)
[facility] PROGRESS NOTE  [name] [name] HKV:[id number] DOB: 1928/10/22 DOA: 07/31/2015 PCP: [name], MD  Summary 81 y.o. male with a past medical history significant for chronic dysphagia, myasthenia gravis, RA, alcoholic cirrhosis, chronic systolic and diastolic CHF (EF 56% in 01/3328), HTN, COPD, aFib and complete heart block with pacemaker, not on warfarin reportedly because of fall risk who presents with increasing pain with swallowing.  All history collected from the patient who is an adequate historian and from chart review. The patient reports a 1 year history of dysphagia and pain with swallowing. Review of old records reveals that the patient has had 1 year of pain:  -Around the start of this, he had dysphagia and then endoscopy that showed strictures which were treated by Dr. Fuller Plan. This dilation helped the dysphagia until two months ago, when the patient developed right neck pain.  -This new neck pain was treated with PPI and nystatin liquid, neither of which helped.  -The patient had a CT of the neck which was unremarkable.  -He was admitted for pneumonia in the midst of this, and had a swallow evaluation during that time that wasconsistent with mild pharyngeal dysphagia with minimal penetration.  -After that, he had a repeat endoscopy that showed nothing and was referred to ENT.  -He saw Dr. Redmond Baseman from [facility], who reportedly did an office laryngoscopy that was negative.  In the last two weeks, this pain has worsened, is now severe, located in the upper right neck, and excruciating with attempting to swallow anything. Because of pain, the patient hasn't eaten anything in two days and today was too weak to stand alone, and so his daughter brought him to the ER.  In the ED, the patient was afebrile and hemodynamically stable. He had an elevated BUN-creatinine ratio, and was weak with standing. His pain was adequately controlled with IV fentanyl,  but he could not swallow without excruciating pain  Assessment/Plan:  Principal Problem:   Odynophagia: had recent endoscopy, reported laryngoscopy with ENT, speech evaluation, imaging, rx with PPI and nystatin, all without improvement or explanation of pain/dysphagia. D/w Dr. Joylene Draft and reviewed last office note. He had planned on referring to neurosurgery. Had CT neck last month. D/w radiology. DDD present, but no spur big enough to impact swallow.  Cant get MRI due to pacemaker.  Nevertheless, not a candidate for spinal surgery if this is the etiology. Carotodynia?  Appreciate Dr. Cornelia Copa assistance for pain management and GOC. Pt does not want feeding tube. Would be hospice appropriate.  Swallowing improved today. Also with white plaque on tongue today. Will give trial of IV diflucan.  Await palliative follow up for guidance RE: disposition and symptom management.  Active Problems:   Rheumatoid arthritis ([place])   Myasthenia gravis ([place])   Essential hypertension, benign   Complete heart block (HCC)   Atrial fibrillation (HCC) pacemaker   CAD (coronary artery disease)   Chronic combined systolic and diastolic congestive heart failure (HCC) compensated   COPD (chronic obstructive pulmonary disease) (HCC)   Anemia due to other cause   Protein-calorie malnutrition, severe (HCC)   Failure to thrive in adult Weakness: PT ordered. Pt declines. CP/arm pain: check EKG and try nitro SL.  Code Status:  DO NOT RESUSCITATE Family Communication:  Daughter at bedside 10/12 Disposition Plan:  ?  Consultants:  Palliative medicine  Procedures:     Antibiotics:    HPI/Subjective: Swallowing a bit of puree. Neck pain improved. Now c/o pain shooting down left  arm and left CP  Objective: Filed Vitals:   08/03/15 1412  BP: 129/56  Pulse: 62  Temp: 97.4 F (36.3 C)  Resp: 16    Intake/Output Summary (Last 24 hours) at 08/03/15 1453 Last data filed at 08/03/15 1436  Gross per 24 hour   Intake   3280 ml  Output    950 ml  Net   2330 ml   Filed Weights   07/31/15 2358  Weight: 59.376 kg (130 lb 14.4 oz)    Exam:   General:  Cachectic elderly male. Asleep. Arousable.  HEENT: Dry mucous membranes. White plaque on tongue.  Neck: Supple no mass noted. Tender to palpation  Cardiovascular: Regular rate rhythm without murmurs gallops rubs  Respiratory: Clear to auscultation bilaterally without wheezes rhonchi or rales  Abdomen: Soft nontender nondistended  Ext: No clubbing cyanosis or edema. Patient yells when I even lightly touch his left arm and hand.  Basic Metabolic Panel:  Recent Labs Lab 07/31/15 1454 08/01/15 0350  NA 136 139  K 4.4 3.9  CL 97* 102  CO2 27 25  GLUCOSE 153* 86  BUN 20 18  CREATININE 0.78 0.72  CALCIUM 9.2 8.8*   Liver Function Tests:  Recent Labs Lab 07/31/15 1454  AST 30  ALT 13*  ALKPHOS 108  BILITOT 0.6  PROT 7.5  ALBUMIN 2.8*    Recent Labs Lab 07/31/15 1454  LIPASE 37    Recent Labs Lab 07/31/15 1601  AMMONIA 31   CBC:  Recent Labs Lab 07/31/15 1454 08/01/15 0350  WBC 6.6 6.5  NEUTROABS 3.8  --   HGB 14.0 12.5*  HCT 40.1 36.2*  MCV 93.5 93.8  PLT 399 381   Cardiac Enzymes: No results for input(s): CKTOTAL, CKMB, CKMBINDEX, TROPONINI in the last 168 hours. BNP (last 3 results)  Recent Labs  11/25/14 1732 06/13/15 0318  BNP 660.0* 461.2*    ProBNP (last 3 results) No results for input(s): PROBNP in the last 8760 hours.  CBG: No results for input(s): GLUCAP in the last 168 hours.  No results found for this or any previous visit (from the past 240 hour(s)).   Studies: No results found.  Scheduled Meds: . antiseptic oral rinse  7 mL Mouth Rinse q12n4p  . azaTHIOprine  50 mg Oral BID  . azelastine  2 spray Each Nare BID  . budesonide-formoterol  2 puff Inhalation BID  . chlorhexidine  15 mL Mouth Rinse BID  . clonazepam  0.5 mg Oral BID  . enoxaparin (LOVENOX) injection  40 mg  Subcutaneous Q24H  . feeding supplement (ENSURE ENLIVE)  237 mL Oral TID BM  . fentaNYL  50 mcg Transdermal Q72H  . fluconazole (DIFLUCAN) IV  100 mg Intravenous Q24H  . furosemide  40 mg Oral Daily  . gi cocktail  30 mL Oral TID  . mirtazapine  15 mg Oral QHS  . pantoprazole  40 mg Oral Daily  . pregabalin  25 mg Oral BID   Continuous Infusions: . sodium chloride 100 mL/hr at 08/03/15 1436    Time spent: 25 minutes  [facility] [email or web address], password [email or web address] 08/03/2015, 2:53 PM  LOS: 2 days

## 2015-08-03 NOTE — Progress Notes (Signed)
PT Cancellation Note  Patient Details Name: [name] [name] MRN: [id number] DOB: 09-01-1929   Cancelled Treatment:    Reason Eval/Treat Not Completed: Medical issues which prohibited therapy. Nursing requesting hold on PT today due to patient's high level of pain. Nursing requesting follow up tomorrow.    Cassell Clement, PT, CSCS Pager [phone] Office [phone]  08/03/2015, 3:20 PM

## 2015-08-03 NOTE — Progress Notes (Signed)
Patient restless compalining of pain, Md made aware , will call palliative for any recommendation.
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
## 2015-08-04 ENCOUNTER — Inpatient Hospital Stay (HOSPITAL_COMMUNITY): Payer: Medicare Other

## 2015-08-04 DIAGNOSIS — M1990 Unspecified osteoarthritis, unspecified site: Secondary | ICD-10-CM

## 2015-08-04 MED ORDER — ACETAMINOPHEN 325 MG PO TABS
650.0000 mg | ORAL_TABLET | Freq: Four times a day (QID) | ORAL | Status: DC | PRN
  Administered 2015-08-04 – 2015-08-06 (×2): 650 mg via ORAL
  Filled 2015-08-04 (×2): qty 2

## 2015-08-04 MED ORDER — METHYLPREDNISOLONE SODIUM SUCC 40 MG IJ SOLR
40.0000 mg | Freq: Once | INTRAMUSCULAR | Status: AC
  Administered 2015-08-04: 40 mg via INTRAVENOUS
  Filled 2015-08-04: qty 1

## 2015-08-04 MED ORDER — OXYCODONE HCL 5 MG PO TABS
10.0000 mg | ORAL_TABLET | ORAL | Status: DC | PRN
  Administered 2015-08-04 – 2015-08-06 (×4): 10 mg via ORAL
  Filled 2015-08-04 (×4): qty 2

## 2015-08-04 MED ORDER — FENTANYL CITRATE (PF) 100 MCG/2ML IJ SOLN
50.0000 ug | INTRAMUSCULAR | Status: DC | PRN
  Administered 2015-08-05: 50 ug via INTRAVENOUS
  Filled 2015-08-04: qty 2

## 2015-08-04 NOTE — Progress Notes (Signed)
Daily Progress Note   Patient Name: [name] [name]       Date: 08/04/2015 DOB: [birth year]  Age: 81 y.o. MRN#: [id number] Attending Physician: Modena Jansky, MD Primary Care Physician: [name], MD Admit Date: 07/31/2015  Reason for Consultation/Follow-up: Establish GOC, pain management  Subjective: 81 y.o. male with a past medical history significant for chronic dysphagia, myasthenia gravis, RA, alcoholic cirrhosis, chronic systolic and diastolic CHF (EF 81% in 01/4817), HTN, COPD, aFib and complete heart block with pacemaker, not on warfarin reportedly because of fall risk admitted after presenting with increasing pain with swallowing with resulting dehydration, frailty, and weight loss.  He has had pain with swallowing and dysphagia for a year with evaluation by GI as well as ENT.  Palliative consulted for GOC and symptom management.    Interval Events: I met with [name] [name] and his daughter, [name] Guiles.  He is awake and alert and jokes at times during encounter and seems less irritable  He reports today that his "core pain" remains unchanged, but he feels that the increase in fentanyl has been helpful to his overall pain level. I spoke with him, his daughter, bedside nursing, and speech therapy. It appears that he is making improvements in his ability to tolerate by mouth intake and was therefore focus on this as indicator for continued titration as I believe there are lots of psychosocial factors that are contributing to his pain and we may never make any gains if we rely solely on his report of numeric pain scale.  He reports that of all the medications that are helpful for his pain, oxycodone has been the most beneficial. I advised that my next step would be to transition him from IV rescue medication to oral rescue medication utilizing oxycodone. He was very agreeable to this.  We spoke at length about discharge planning and next steps moving forward.  The only option of the ones we  have discussed that he is agreeable to that his daughter agrees with be a safe discharge is to SNF for rehab.  He quickly points out his ultimate goal is to return to his own home, but he realizes that he will need to get stronger to do this.  Length of Stay: 3 days  Current Medications: Scheduled Meds:  . antiseptic oral rinse  7 mL Mouth Rinse q12n4p  . azaTHIOprine  50 mg Oral BID  . azelastine  2 spray Each Nare BID  . budesonide-formoterol  2 puff Inhalation BID  . chlorhexidine  15 mL Mouth Rinse BID  . clonazepam  0.5 mg Oral BID  . enoxaparin (LOVENOX) injection  40 mg Subcutaneous Q24H  . feeding supplement (ENSURE ENLIVE)  237 mL Oral TID BM  . fentaNYL  75 mcg Transdermal Q72H  . fluconazole (DIFLUCAN) IV  100 mg Intravenous Q24H  . furosemide  40 mg Oral Daily  . gi cocktail  30 mL Oral TID  . OLANZapine zydis  5 mg Oral QHS  . pantoprazole  40 mg Oral Daily  . pregabalin  25 mg Oral BID    Continuous Infusions: . sodium chloride Stopped (08/04/15 1702)    PRN Meds: acetaminophen, fentaNYL (SUBLIMAZE) injection, nitroGLYCERIN  Palliative Performance Scale: 50%     Vital Signs: BP 163/100 mmHg  Pulse 61  Temp(Src) 98.8 F (37.1 C) (Axillary)  Resp 20  Ht $R'5\' 10"'Si$  (1.778 m)  Wt 59.376 kg (130 lb 14.4 oz)  BMI 18.78 kg/m2  SpO2 94% SpO2: SpO2: 94 %  O2 Device: O2 Device: Not Delivered O2 Flow Rate:    Intake/output summary:   Intake/Output Summary (Last 24 hours) at 08/04/15 1926 Last data filed at 08/04/15 1749  Gross per 24 hour  Intake   3220 ml  Output   1350 ml  Net   1870 ml   LBM:   Baseline Weight: Weight: 59.376 kg (130 lb 14.4 oz) Most recent weight: Weight: 59.376 kg (130 lb 14.4 oz)  Physical Exam:  General: Cachectic elderly male. Awake and alert.  Irritable  HEENT: Dry mucous membranes. Plaque on tongue noted.  Neck: Supple, no mass noted. Tender to palpation  Cardiovascular: Regular rate rhythm without murmurs gallops  rubs  Respiratory: Clear to auscultation bilaterally without wheezes rhonchi or rales  Abdomen: Soft nontender nondistended Ext: No clubbing cyanosis or edema.  Additional Data Reviewed: No results for input(s): WBC, HGB, PLT, NA, BUN, CREATININE in the last 72 hours.  Invalid input(s): ALB   Problem List:  Patient Active Problem List   Diagnosis Date Noted  . Intractable pain   . Palliative care encounter   . Dehydration 08/01/2015  . Failure to thrive in adult 07/31/2015  . Odynophagia 07/31/2015  . Protein-calorie malnutrition, severe ([place]) 06/15/2015  . Hoarding disorder with poor insight 11/29/2014  . Alcohol abuse 11/26/2014  . COPD (chronic obstructive pulmonary disease) ([place]) 11/26/2014  . Anemia due to other cause 11/26/2014  . Chronic combined systolic and diastolic congestive heart failure ([place]) 03/10/2014  . Herpes zoster 12/20/2013  . SOB (shortness of breath) 11/04/2013  . CHF (congestive heart failure) ([place]) 11/03/2013  . Dyspnea 11/03/2013  . CAP (community acquired pneumonia): Probable 11/03/2013  . COPD with acute exacerbation: Probable 11/03/2013  . Bronchitis: Probable 11/03/2013  . Postural dizziness 02/13/2012  . CAD (coronary artery disease) 03/15/2011  . Essential hypertension, benign 12/13/2010  . Complete heart block ([place]) 12/13/2010  . Atrial fibrillation ([place]) 12/13/2010  . Anxiety state 03/04/2008  . Depression 03/04/2008  . EXTERNAL HEMORRHOIDS 03/04/2008  . ASTHMA 03/04/2008  . GERD 03/04/2008  . DIVERTICULOSIS, COLON 03/04/2008  . NEPHROLITHIASIS 03/04/2008  . Rheumatoid arthritis ([place]) 03/04/2008  . Myasthenia gravis ([place]) 03/04/2008  . COLONIC POLYPS, HYPERPLASTIC 02/04/2006     Palliative Care Assessment & Plan    Code Status:  DNR  Goals of Care:  Overall, [name] [name] continues to endorse his eventual goal is to return to his own home. He endorses today that he is weaker and is therefore unable to care for himself in his  current state.  He is agreeable to discharge to a skilled facility for further rehabilitation with his eventual goal being to be discharged to home. I'm not sure if he will ever make gains that will allow him to live independently again, and this was discussed with his daughter outside the room.  She reports understanding this fact. I advised that palliative care through hospice and palliative care of [facility] follow him as an outpatient to continue with this discussion as well as continue with recommendations for titration his pain medications. His daughter is agreeable to this would be a good resource for them to have available if it can be arranged at the facility where he is placed.  Symptom Management:  Pain: Patient still reports that he has uncontrolled pain.  He states that he has "core pain" that remains unchanged. He does state that he thinks he feels better overall since increasing fentanyl patch to 71mcg.  We talked about his rescue medication, which  is currently IV fentanyl. He reports that he was doing very well at home on oxycodone and believes that oxycodone is the medication that was most beneficial to him. We'll plan to transition his rescue medication to oxycodone 10 mg by mouth every 4 hours as needed. He is concerned that his pain will become uncontrolled using only oral regimen, and we'll therefore leave backup dose of fentanyl 50 g IV as needed if oral regimen is ineffective. The preference will be to use oxycodone first and use fentanyl IV only as rescue medication if oral route is ineffective. I discussed this with his bedside nurse reports understanding plan moving forward.  In talking with him, his daughter, and other staff, it appears his swallowing continues to improve. While he continues to report high levels of pain, I think we are going to need to focus on his functional status as indicator of how we are doing with his pain regimen. I continue to think that there may be a  large component of existential suffering as well as some degree of chemical coping. I am not sure we'll ever reach a point where he reports lower pain scores due to psychosocial factors that play into his pain perception. We will therefore continue to focus on how he is doing functionally as we titrate medication.  Psycho-social/Spiritual:  Desire for further Chaplaincy support:No   Prognosis: <6 months and he would qualify for hospice services on discharge if desired.  Discharge Planning: We spoke at length today about what would be discharge plan that he would find acceptable. He stated today that he would be willing to go for rehabilitation but his eventual goal is to return to his own home. Would recommend discharge to skilled facility for rehabilitation with palliative care follow-up through hospice and palliative care of [facility] if this can be arranged at the facility where he discharges.   Care plan was discussed with patient and his daughter  Thank you for allowing the Palliative Medicine Team to assist in the care of this patient.   Time In: 1640 Time Out: 1720 Total Time 40 Prolonged Time Billed  No    Greater than 50%  of this time was spent counseling and coordinating care related to the above assessment and plan.   Micheline Rough, MD  08/04/2015, 7:26 PM  Please contact Palliative Medicine Team phone at [phone] for questions and concerns.

## 2015-08-04 NOTE — Clinical Social Work Note (Signed)
CSW continuing to follow and assist with discharge disposition planning once established.  [name] [name], [place] Clinical Social Work Department Orthopedics [phone]) and Surgical [phone])

## 2015-08-04 NOTE — Progress Notes (Signed)
PT Cancellation Note  Patient Details Name: [name] EASTRIDGE MRN: [id number] DOB: 1929-02-20   Cancelled Treatment:    Reason Eval/Treat Not Completed: Patient at procedure or test/unavailable.  Attempted to see patient at 13:48 but per RN pt had just laid down and fallen asleep after feeling weak and asked if PT could come back later.  Tried again to see patient at 14:48 but pt off unit for X-ray.  Will re-attempt evaluation tomorrow or as schedule allows.   Canary Brim Ivory Broad, PT, DPT Pager #: [phone]  08/04/2015, 2:48 PM

## 2015-08-04 NOTE — Progress Notes (Signed)
Speech Language Pathology Treatment: Dysphagia  Patient Details Name: [name] [name] MRN: [id number] DOB: August 25, 1929 Today's Date: 08/04/2015 Time: 2248-2500 SLP Time Calculation (min) (ACUTE ONLY): 35 min  Assessment / Plan / Recommendation Clinical Impression  Pt observed with thin liquids today, with Min cues provided for small single sips, which appears to reduce coughing episodes. Pt has multiple swallows with facial grimacing, and it appears that when coughing does occur it is following times when he shows signs of the most discomfort. SLP attempted various postural maneuvers, none of which seemed to change performance by therapist observation and by subjective pt report. Pt continues to decline thicker liquids and solids when offered by SLP.  Discussed with pt/daughter in detail - he is clear that he does not want alternative means of nutrition and he also does not want diet modifications beyond his current Dys 1 diet and thin liquids. SLP has offered various forms of intervention to increase safety with thin liquids (strategies, postures, maneuvers). Would recommend to continue to utilize small, single sips at a time to reduce (but not eliminate) the risk of aspiration with thin liquids. SLP to sign off at this time - should pt have a change in performance or be open to additional SLP interventions, please re-order.   HPI Other Pertinent Information: [name] [name] is a 81 y.o. male with a past medical history significant for chronic dysphagia, myasthenia gravis, RA, alcoholic cirrhosis, chronic systolic and diastolic CHF (EF 37% in 0/4888), HTN, COPD, aFib and complete heart block with pacemaker, not on warfarin reportedly because of fall risk who presents with increasing pain with swallowing.   Pertinent Vitals Pain Assessment: Faces Faces Pain Scale: Hurts even more Pain Location: neck, with swallowing Pain Descriptors / Indicators: Grimacing Pain Intervention(s): Monitored during  session;Limited activity within patient's tolerance  SLP Plan  Discharge SLP treatment due to (comment) (pt limited by pain, does not wish to try diet modifications)    Recommendations Diet recommendations: Dysphagia 1 (puree);Thin liquid Liquids provided via: Cup;Straw Medication Administration: Other (Comment) (liquid form as able, or crushed with puree) Supervision: Patient able to self feed;Intermittent supervision to cue for compensatory strategies Compensations: Slow rate;Small sips/bites;Clear throat intermittently Postural Changes and/or Swallow Maneuvers: Seated upright 90 degrees       Oral Care Recommendations: Oral care BID Follow up Recommendations: Home health SLP Plan: Discharge SLP treatment due to (comment) (pt limited by pain, does not wish to try diet modifications)    [name], M.A. CCC-SLP [phone]  [name] 08/04/2015, 4:46 PM

## 2015-08-04 NOTE — Progress Notes (Signed)
[facility] PROGRESS NOTE  [name] [name] QIH:[id number] DOB: 1929/08/30 DOA: 07/31/2015 PCP: [name], MD  Summary 81 y.o. male with a past medical history significant for chronic dysphagia, myasthenia gravis, RA, alcoholic cirrhosis, chronic systolic and diastolic CHF (EF 87% in 02/6432), HTN, COPD, aFib and complete heart block with pacemaker, not on warfarin reportedly because of fall risk who presents with increasing pain with swallowing.  All history collected from the patient who is an adequate historian and from chart review. The patient reports a 1 year history of dysphagia and pain with swallowing. Review of old records reveals that the patient has had 1 year of pain:  -Around the start of this, he had dysphagia and then endoscopy that showed strictures which were treated by Dr. Fuller Plan. This dilation helped the dysphagia until two months ago, when the patient developed right neck pain.  -This new neck pain was treated with PPI and nystatin liquid, neither of which helped.  -The patient had a CT of the neck which was unremarkable.  -He was admitted for pneumonia in the midst of this, and had a swallow evaluation during that time that wasconsistent with mild pharyngeal dysphagia with minimal penetration.  -After that, he had a repeat endoscopy that showed nothing and was referred to ENT.  -He saw Dr. Redmond Baseman from [facility], who reportedly did an office laryngoscopy that was negative.  In the last two weeks, this pain has worsened, is now severe, located in the upper right neck, and excruciating with attempting to swallow anything. Because of pain, the patient hasn't eaten anything in two days PTA and was too weak to stand alone, and so his daughter brought him to the ER.  In the ED, the patient was afebrile and hemodynamically stable. He had an elevated BUN-creatinine ratio, and was weak with standing. His pain was adequately controlled with IV fentanyl, but  he could not swallow without excruciating pain  Assessment/Plan:  Principal Problem:   Odynophagia:  - had recent endoscopy, reported laryngoscopy with ENT, speech evaluation, imaging, rx with PPI and nystatin, all without improvement or explanation of pain/dysphagia.  - Dr. Conley Canal D/w Dr. Joylene Draft and reviewed last office note. He had planned on referring to neurosurgery. Had CT neck last month. D/w radiology. DDD present, but no spur big enough to impact swallow.  Cant get MRI due to pacemaker.  Nevertheless, not a candidate for spinal surgery if this is the etiology.  - Carotodynia?  Appreciate Dr. Cornelia Copa assistance for pain management and GOC. Pt does not want feeding tube. Would be hospice appropriate.  Swallowing improved slightly. Also with white plaque on tongue 10/13y. Trial of IV diflucan.  Await palliative follow up for guidance RE: disposition and symptom management.  Active Problems:   Rheumatoid arthritis ([place]) - Patient has acute pain and arthritis flareup in his left wrist. X-rays showed degenerative changes without fractures. We'll try a dose of IV Solu-Medrol. His left upper extremity pain is more likely secondary to this than cardiac etiology. No chest pain reported. - Continue azathioprine    Myasthenia gravis ([place])    Essential hypertension, benign - Controlled.     Complete heart block ([place]), s/p PPM/A. fib/CAD/chronic combined CHF - Stable    COPD (chronic obstructive pulmonary disease) ([facility]) - Stable    Anemia due to other cause    Protein-calorie malnutrition, severe ([facility])    Failure to thrive in adult   DVT prophylaxis: Lovenox Code Status:  DO NOT RESUSCITATE Family Communication:  Daughter at bedside 08/20/2023 Disposition Plan:  To be determined. Not medically stable for discharge.  Consultants:  Palliative medicine  Procedures:   None  Antibiotics:  None  HPI/Subjective: States that pain on swallowing is slightly better. Complaints of  excruciating left wrist pain which is worse on movement and also complaints of entire left upper extremity pain/shoulder pain area and no chest pain reported.  Objective: Filed Vitals:   2015/08/20 1036 08-20-15 1129 20-Aug-2015 1418 2015-08-20 1500  BP:  125/57  121/56  Pulse: 74 60  64  Temp:  101 F (38.3 C) 97.7 F (36.5 C) 98.6 F (37 C)  TempSrc:  Oral Oral Oral  Resp:  16  18  Height:      Weight:      SpO2:  94%  100%     Intake/Output Summary (Last 24 hours) at 08-20-15 1650 Last data filed at 08-20-2015 1500  Gross per 24 hour  Intake   3100 ml  Output   1550 ml  Net   1550 ml   Filed Weights   07/31/15 2358  Weight: 59.376 kg (130 lb 14.4 oz)    Exam:   General:  Pleasant elderly male, frail, chronically ill-looking, sitting up comfortably in bed in good spirits.  Neck: Supple no mass noted. Tender to palpation  Cardiovascular: Regular rate rhythm without murmurs gallops rubs  Respiratory: Clear to auscultation bilaterally without wheezes rhonchi or rales. No increased work of breathing.  Abdomen: Soft nontender nondistended  Ext: No clubbing cyanosis or edema. Mild swelling, increased warmth, significant tenderness and painful range of movements of left wrist.  Basic Metabolic Panel:  Recent Labs Lab 07/31/15 1454 08/01/15 0350  NA 136 139  K 4.4 3.9  CL 97* 102  CO2 27 25  GLUCOSE 153* 86  BUN 20 18  CREATININE 0.78 0.72  CALCIUM 9.2 8.8*   Liver Function Tests:  Recent Labs Lab 07/31/15 1454  AST 30  ALT 13*  ALKPHOS 108  BILITOT 0.6  PROT 7.5  ALBUMIN 2.8*    Recent Labs Lab 07/31/15 1454  LIPASE 37    Recent Labs Lab 07/31/15 1601  AMMONIA 31   CBC:  Recent Labs Lab 07/31/15 1454 08/01/15 0350  WBC 6.6 6.5  NEUTROABS 3.8  --   HGB 14.0 12.5*  HCT 40.1 36.2*  MCV 93.5 93.8  PLT 399 381   Cardiac Enzymes: No results for input(s): CKTOTAL, CKMB, CKMBINDEX, TROPONINI in the last 168 hours. BNP (last 3  results)  Recent Labs  11/25/14 1732 06/13/15 0318  BNP 660.0* 461.2*    ProBNP (last 3 results) No results for input(s): PROBNP in the last 8760 hours.  CBG: No results for input(s): GLUCAP in the last 168 hours.  No results found for this or any previous visit (from the past 240 hour(s)).   Studies: Dg Wrist Complete Left  08-20-2015  CLINICAL DATA:  Pain and swelling for 1 day without known trauma. EXAM: LEFT WRIST - COMPLETE 3+ VIEW COMPARISON:  09/09/2011. FINDINGS: Advanced osteopenia and multifocal osteoarthritis. Vascular calcifications. Joint space narrowing is marked throughout the carpal bones and at the base of the thumb. Scaphoid intact. IMPRESSION: Degenerative change, without acute osseous finding. Electronically Signed   By: Abigail Miyamoto M.D.   On: 08-20-2015 15:03    Scheduled Meds: . antiseptic oral rinse  7 mL Mouth Rinse q12n4p  . azaTHIOprine  50 mg Oral BID  . azelastine  2 spray Each Nare BID  .  budesonide-formoterol  2 puff Inhalation BID  . chlorhexidine  15 mL Mouth Rinse BID  . clonazepam  0.5 mg Oral BID  . enoxaparin (LOVENOX) injection  40 mg Subcutaneous Q24H  . feeding supplement (ENSURE ENLIVE)  237 mL Oral TID BM  . fentaNYL  75 mcg Transdermal Q72H  . fluconazole (DIFLUCAN) IV  100 mg Intravenous Q24H  . furosemide  40 mg Oral Daily  . gi cocktail  30 mL Oral TID  . OLANZapine zydis  5 mg Oral QHS  . pantoprazole  40 mg Oral Daily  . pregabalin  25 mg Oral BID   Continuous Infusions: . sodium chloride 100 mL/hr at 08/04/15 1028    Time spent: 25 minutes    Donell Sliwinski, MD, FACP, FHM. [facility] Pager [phone]  If 7PM-7AM, please contact night-coverage [email or web address] Password [email or web address] 08/04/2015, 4:50 PM    LOS: 3 days

## 2015-08-04 NOTE — Care Management Important Message (Signed)
Important Message  Patient Details  Name: [name] [name] MRN: [id number] Date of Birth: [birth year]   Medicare Important Message Given:  Yes-second notification given    Delorse Lek 08/04/2015, 11:12 AM

## 2015-08-05 NOTE — Progress Notes (Signed)
[facility] PROGRESS NOTE  [name] [name] QMV:[id number] DOB: [birth year] DOA: 07/31/2015 PCP: [name], MD  Summary 81 y.o. male with a past medical history significant for chronic dysphagia, myasthenia gravis, RA, alcoholic cirrhosis, chronic systolic and diastolic CHF (EF 28% in 01/1323), HTN, COPD, aFib and complete heart block with pacemaker, not on warfarin reportedly because of fall risk who presents with increasing pain with swallowing.  All history collected from the patient who is an adequate historian and from chart review. The patient reports a 1 year history of dysphagia and pain with swallowing. Review of old records reveals that the patient has had 1 year of pain:  -Around the start of this, he had dysphagia and then endoscopy that showed strictures which were treated by Dr. Fuller Plan. This dilation helped the dysphagia until two months ago, when the patient developed right neck pain.  -This new neck pain was treated with PPI and nystatin liquid, neither of which helped.  -The patient had a CT of the neck which was unremarkable.  -He was admitted for pneumonia in the midst of this, and had a swallow evaluation during that time that wasconsistent with mild pharyngeal dysphagia with minimal penetration.  -After that, he had a repeat endoscopy that showed nothing and was referred to ENT.  -He saw Dr. Redmond Baseman from [facility], who reportedly did an office laryngoscopy that was negative.  In the last two weeks, this pain has worsened, is now severe, located in the upper right neck, and excruciating with attempting to swallow anything. Because of pain, the patient hasn't eaten anything in two days PTA and was too weak to stand alone, and so his daughter brought him to the ER.  In the ED, the patient was afebrile and hemodynamically stable. He had an elevated BUN-creatinine ratio, and was weak with standing. His pain was adequately controlled with IV fentanyl, but  he could not swallow without excruciating pain  Assessment/Plan:  Principal Problem:   Odynophagia:  - had recent endoscopy, reported laryngoscopy with ENT, speech evaluation, imaging, rx with PPI and nystatin, all without improvement or explanation of pain/dysphagia.  - Dr. Conley Canal D/w Dr. Joylene Draft and reviewed last office note. He had planned on referring to neurosurgery. Had CT neck last month. D/w radiology. DDD present, but no spur big enough to impact swallow.  Cant get MRI due to pacemaker.  Nevertheless, not a candidate for spinal surgery if this is the etiology.  - Carotodynia?  Appreciate palliative team assistance for pain management and GOC. Pt does not want feeding tube. Would be hospice appropriate.  Swallowing improved slightly. Also with white plaque on tongue 10/13. Trial of IV diflucan.   - Functionally seems to be better. Discussed with palliative care M.D 10/14: Appropriate for rehabilitation prior to discharge home. Patient agreeable to SNF for rehabilitation. - Speech therapy evaluated 10/14 and recommends dysphagia 1 diet and thin liquids. Per nursing,? Aspiration this morning.  Active Problems:   Rheumatoid arthritis ([place]) - Patient had acute pain and arthritis flareup in his left wrist on 10/14 . X-rays showed degenerative changes without fractures. His left upper extremity pain was more likely secondary to this than cardiac etiology. No chest pain reported. - Continue azathioprine - Treated with a dose of IV Solu-Medrol on 10/14 with resolution of symptoms     Myasthenia gravis ([place])    Essential hypertension, benign - Controlled.     Complete heart block ([place]), s/p PPM/A. fib/CAD/chronic combined CHF - Stable    COPD (  chronic obstructive pulmonary disease) (HCC) - Stable    Anemia due to other cause    Protein-calorie malnutrition, severe (HCC)    Failure to thrive in adult   DVT prophylaxis: Lovenox Code Status:  DO NOT RESUSCITATE Family  Communication:  Daughter at bedside 15-Aug-2023. None at bedside today.  Disposition Plan:  DC to SNF when bed available.  Consultants:  Palliative medicine  Procedures:   None  Antibiotics:  None  HPI/Subjective: States that left wrist and upper extremity pain have completely resolved. Pain on swallowing persists. As per nursing,? Aspiration this morning.   Objective: Filed Vitals:   08/05/15 0500 08/05/15 0606 08/05/15 1238 08/05/15 1400  BP: 90/55 106/49  95/38  Pulse: 67   58  Temp: 97.5 F (36.4 C)   97.9 F (36.6 C)  TempSrc: Oral   Oral  Resp: 16   15  Height:      Weight:      SpO2: 93%  92% 98%     Intake/Output Summary (Last 24 hours) at 08/05/15 1440 Last data filed at 08/05/15 0500  Gross per 24 hour  Intake    120 ml  Output   1300 ml  Net  -1180 ml   Filed Weights   07/31/15 2358  Weight: 59.376 kg (130 lb 14.4 oz)    Exam:   General:  Pleasant elderly male, frail, chronically ill-looking, sitting up comfortably at edge of bed with nursing assistance.  Neck: Supple no mass noted. Tender to palpation  Cardiovascular: Regular rate rhythm without murmurs gallops rubs  Respiratory: Clear to auscultation bilaterally without wheezes rhonchi or rales. No increased work of breathing.  Abdomen: Soft nontender nondistended  Ext: No clubbing cyanosis or edema. Acute arthritic findings of left wrist have completely resolved. Deformities persist.    Basic Metabolic Panel:  Recent Labs Lab 07/31/15 1454 08/01/15 0350  NA 136 139  K 4.4 3.9  CL 97* 102  CO2 27 25  GLUCOSE 153* 86  BUN 20 18  CREATININE 0.78 0.72  CALCIUM 9.2 8.8*   Liver Function Tests:  Recent Labs Lab 07/31/15 1454  AST 30  ALT 13*  ALKPHOS 108  BILITOT 0.6  PROT 7.5  ALBUMIN 2.8*    Recent Labs Lab 07/31/15 1454  LIPASE 37    Recent Labs Lab 07/31/15 1601  AMMONIA 31   CBC:  Recent Labs Lab 07/31/15 1454 08/01/15 0350  WBC 6.6 6.5  NEUTROABS 3.8   --   HGB 14.0 12.5*  HCT 40.1 36.2*  MCV 93.5 93.8  PLT 399 381   Cardiac Enzymes: No results for input(s): CKTOTAL, CKMB, CKMBINDEX, TROPONINI in the last 168 hours. BNP (last 3 results)  Recent Labs  11/25/14 1732 06/13/15 0318  BNP 660.0* 461.2*    ProBNP (last 3 results) No results for input(s): PROBNP in the last 8760 hours.  CBG: No results for input(s): GLUCAP in the last 168 hours.  No results found for this or any previous visit (from the past 240 hour(s)).   Studies: Dg Wrist Complete Left  08-15-2015  CLINICAL DATA:  Pain and swelling for 1 day without known trauma. EXAM: LEFT WRIST - COMPLETE 3+ VIEW COMPARISON:  09/09/2011. FINDINGS: Advanced osteopenia and multifocal osteoarthritis. Vascular calcifications. Joint space narrowing is marked throughout the carpal bones and at the base of the thumb. Scaphoid intact. IMPRESSION: Degenerative change, without acute osseous finding. Electronically Signed   By: Abigail Miyamoto M.D.   On: 08/15/2015 15:03  Scheduled Meds: . antiseptic oral rinse  7 mL Mouth Rinse q12n4p  . azaTHIOprine  50 mg Oral BID  . azelastine  2 spray Each Nare BID  . budesonide-formoterol  2 puff Inhalation BID  . chlorhexidine  15 mL Mouth Rinse BID  . clonazepam  0.5 mg Oral BID  . enoxaparin (LOVENOX) injection  40 mg Subcutaneous Q24H  . feeding supplement (ENSURE ENLIVE)  237 mL Oral TID BM  . fentaNYL  75 mcg Transdermal Q72H  . fluconazole (DIFLUCAN) IV  100 mg Intravenous Q24H  . furosemide  40 mg Oral Daily  . gi cocktail  30 mL Oral TID  . OLANZapine zydis  5 mg Oral QHS  . pantoprazole  40 mg Oral Daily  . pregabalin  25 mg Oral BID   Continuous Infusions: . sodium chloride 50 mL/hr at 08/05/15 1127    Time spent: 25 minutes    Keoshia Steinmetz, MD, FACP, FHM. [facility] Pager [phone]  If 7PM-7AM, please contact night-coverage [email or web address] Password [email or web address] 08/05/2015, 2:40 PM    LOS: 4 days

## 2015-08-05 NOTE — Progress Notes (Signed)
Observed with thin liquid and pt is clearly having difficulty. Swallowed bite of applesauce with 3 swallows. Diet changed to nectar thick.

## 2015-08-05 NOTE — Progress Notes (Signed)
Daily Progress Note   Patient Name: [name] [name]       Date: 08/05/2015 DOB: 10-27-1928  Age: 81 y.o. MRN#: [id number] Attending Physician: Modena Jansky, MD Primary Care Physician: [name], MD Admit Date: 07/31/2015  Reason for Consultation/Follow-up: Establish GOC, pain management  Subjective: 81 y.o. male with a past medical history significant for chronic dysphagia, myasthenia gravis, RA, alcoholic cirrhosis, chronic systolic and diastolic CHF (EF 28% in 12/1515), HTN, COPD, aFib and complete heart block with pacemaker, not on warfarin reportedly because of fall risk admitted after presenting with increasing pain with swallowing with resulting dehydration, frailty, and weight loss.  He has had pain with swallowing and dysphagia for a year with evaluation by GI as well as ENT.  Palliative consulted for GOC and symptom management.    Interval Events: I met with [name] [name] and his daughter, [name] [name].  He is awake and alert and jokes at times during encounter.  He appears to be in a much better mood this afternoon.  Reports that he still has pain, but is much better controlled and he has been able to increase his PO intake.  States he is much happier with his pain regimen since starting oxycodone.  Length of Stay: 4 days  Current Medications: Scheduled Meds:  . antiseptic oral rinse  7 mL Mouth Rinse q12n4p  . azaTHIOprine  50 mg Oral BID  . azelastine  2 spray Each Nare BID  . budesonide-formoterol  2 puff Inhalation BID  . chlorhexidine  15 mL Mouth Rinse BID  . clonazepam  0.5 mg Oral BID  . enoxaparin (LOVENOX) injection  40 mg Subcutaneous Q24H  . feeding supplement (ENSURE ENLIVE)  237 mL Oral TID BM  . fentaNYL  75 mcg Transdermal Q72H  . fluconazole (DIFLUCAN) IV  100 mg Intravenous Q24H  . furosemide  40 mg Oral Daily  . gi cocktail  30 mL Oral TID  . OLANZapine zydis  5 mg Oral QHS  . pantoprazole  40 mg Oral Daily  . pregabalin  25 mg Oral BID     Continuous Infusions:    PRN Meds: acetaminophen, fentaNYL (SUBLIMAZE) injection, nitroGLYCERIN, oxyCODONE  Palliative Performance Scale: 50%     Vital Signs: BP 95/38 mmHg  Pulse 58  Temp(Src) 97.9 F (36.6 C) (Oral)  Resp 15  Ht $R'5\' 10"'jk$  (1.778 m)  Wt 59.376 kg (130 lb 14.4 oz)  BMI 18.78 kg/m2  SpO2 94% SpO2: SpO2:  (Pt asleep. Nurse deferred vitals.) O2 Device: O2 Device: Not Delivered O2 Flow Rate: O2 Flow Rate (L/min): 2 L/min  Intake/output summary:   Intake/Output Summary (Last 24 hours) at 08/05/15 2319 Last data filed at 08/05/15 1745  Gross per 24 hour  Intake      0 ml  Output    750 ml  Net   -750 ml   LBM:   Baseline Weight: Weight: 59.376 kg (130 lb 14.4 oz) Most recent weight: Weight: 59.376 kg (130 lb 14.4 oz)  Physical Exam:  General: Cachectic elderly male. Awake and alert.  Irritable  HEENT: Dry mucous membranes. Plaque on tongue noted.  Neck: Supple, no mass noted. Tender to palpation  Cardiovascular: Regular rate rhythm without murmurs gallops rubs  Respiratory: Clear to auscultation bilaterally without wheezes rhonchi or rales  Abdomen: Soft nontender nondistended Ext: No clubbing cyanosis or edema.  Additional Data Reviewed: No results for input(s): WBC, HGB, PLT, NA, BUN, CREATININE in the last 72 hours.  Invalid input(s): ALB  Problem List:  Patient Active Problem List   Diagnosis Date Noted  . Intractable pain   . Palliative care encounter   . Dehydration 08/01/2015  . Failure to thrive in adult 07/31/2015  . Odynophagia 07/31/2015  . Protein-calorie malnutrition, severe ([place]) 06/15/2015  . Hoarding disorder with poor insight 11/29/2014  . Alcohol abuse 11/26/2014  . COPD (chronic obstructive pulmonary disease) ([place]) 11/26/2014  . Anemia due to other cause 11/26/2014  . Chronic combined systolic and diastolic congestive heart failure ([place]) 03/10/2014  . Herpes zoster 12/20/2013  . SOB (shortness of breath)  11/04/2013  . CHF (congestive heart failure) ([place]) 11/03/2013  . Dyspnea 11/03/2013  . CAP (community acquired pneumonia): Probable 11/03/2013  . COPD with acute exacerbation: Probable 11/03/2013  . Bronchitis: Probable 11/03/2013  . Postural dizziness 02/13/2012  . CAD (coronary artery disease) 03/15/2011  . Essential hypertension, benign 12/13/2010  . Complete heart block ([place]) 12/13/2010  . Atrial fibrillation ([place]) 12/13/2010  . Anxiety state 03/04/2008  . Depression 03/04/2008  . EXTERNAL HEMORRHOIDS 03/04/2008  . ASTHMA 03/04/2008  . GERD 03/04/2008  . DIVERTICULOSIS, COLON 03/04/2008  . NEPHROLITHIASIS 03/04/2008  . Rheumatoid arthritis ([place]) 03/04/2008  . Myasthenia gravis ([place]) 03/04/2008  . COLONIC POLYPS, HYPERPLASTIC 02/04/2006     Palliative Care Assessment & Plan    Code Status:  DNR  Goals of Care:  Overall, [name] [name] continues to endorse his eventual goal is to return to his own home. He endorses today that he is weaker and is therefore unable to care for himself in his current state.  He worked with PT today and thinks this was beneficial.  He is agreeable to discharge to a skilled facility for further rehabilitation with his eventual goal being to be discharged to home. I'm not sure if he will ever make gains that will allow him to live independently again, and this was discussed with his daughter outside the room.  She reports understanding this fact. I advised that palliative care through hospice and palliative care of [facility] follow him as an outpatient to continue with this discussion as well as continue with recommendations for titration his pain medications. His daughter is agreeable to this would be a good resource for them to have available if it can be arranged at the facility where he is placed.  Symptom Management:  Pain: Patient still reports that he has pain, but reports it is now "much more manageable."   Continue rescue medication of  oxycodone 10 mg by mouth every 4 hours as needed. He is concerned that his pain will become uncontrolled using only oral regimen, and we'll therefore leave backup dose of fentanyl 50 g IV as needed if oral regimen is ineffective.  While he continues to report high levels of pain, he endorses that his swallowing has improved.  I think we are going to need to focus on his functional status as indicator of how we are doing with his pain regimen. I continue to think that there may be a large component of existential suffering as well as some degree of chemical coping. I am not sure we'll ever reach a point where he reports lower pain scores due to psychosocial factors that play into his pain perception. We will therefore continue to focus on how he is doing functionally as we titrate medication.  Psycho-social/Spiritual:  Desire for further Chaplaincy support:No   Prognosis: <6 months and he would qualify for hospice services on discharge if desired.  Discharge Planning:  He  has stated that he would be willing to go for rehabilitation but his eventual goal is to return to his own home. Would recommend discharge to skilled facility for rehabilitation with palliative care follow-up through hospice and palliative care of [facility] if this can be arranged at the facility where he discharges.   Care plan was discussed with patient and his daughter  Thank you for allowing the Palliative Medicine Team to assist in the care of this patient.   Time In: 1820 Time Out: 1840 Total Time 20 Prolonged Time Billed  No    Greater than 50%  of this time was spent counseling and coordinating care related to the above assessment and plan.   Micheline Rough, MD  08/05/2015, 11:19 PM  Please contact Palliative Medicine Team phone at [phone] for questions and concerns.

## 2015-08-05 NOTE — Evaluation (Signed)
Physical Therapy Evaluation Patient Details Name: [name] [name] MRN: [id number] DOB: [birth year] Today's Date: 08/05/2015   History of Present Illness  Pt is a 81 y.o. male with PMH of s/p of splenectomy, MG on Imuran, combined systolic and diastolic congestive heart failure (EF of 40-45% with grade 3 diastolic dysfunction), COPD, GERD, depression, anxiety, complete heart block, pacemaker placement, BPH, arthritis, alcohol abuse, liver cirrhosis, skin cancer, history of esophageal stricture, who presents with productive cough and shortness of breath. Pt admitted for difficulty swallowing and generalized weakness.   Clinical Impression  Pt admitted with above diagnosis. Pt currently with functional limitations due to the deficits listed below (see PT Problem List). Pt currently seen by palliative care and pt's goals are to return home after rehab.  If pt is to return home there will need to be 24 hour supervision/assist due to pt safety awareness and impulsiveness. Pt will benefit from skilled PT to increase their independence and safety with mobility to allow discharge to the venue listed below.       Follow Up Recommendations SNF-depending on progress and ability to participate with therapy    Equipment Recommendations  None recommended by PT    Recommendations for Other Services       Precautions / Restrictions Precautions Precautions: Fall Restrictions Weight Bearing Restrictions: No      Mobility  Bed Mobility Overal bed mobility: Needs Assistance Bed Mobility: Supine to Sit     Supine to sit: Supervision     General bed mobility comments: supervision for safety  Transfers Overall transfer level: Needs assistance Equipment used: Rolling walker (2 wheeled) Transfers: Sit to/from Stand Sit to Stand: Supervision         General transfer comment: supervision for safety with cues for hand placement  Ambulation/Gait Ambulation/Gait assistance: Min guard Ambulation  Distance (Feet): 300 Feet Assistive device: Rolling walker (2 wheeled) Gait Pattern/deviations: Step-through pattern;Decreased stride length;Shuffle     General Gait Details: minguard for safety with cues for RW placement. pt with slow cadence and difficult to attend to task  Stairs            Wheelchair Mobility    Modified Rankin (Stroke Patients Only)       Balance Overall balance assessment: Needs assistance Sitting-balance support: Feet supported Sitting balance-Leahy Scale: Good     Standing balance support: Bilateral upper extremity supported Standing balance-Leahy Scale: Fair                               Pertinent Vitals/Pain Pain Assessment: No/denies pain    Home Living Family/patient expects to be discharged to:: Private residence Living Arrangements: Alone Available Help at Discharge: Family Type of Home: House Home Access: Stairs to enter Entrance Stairs-Rails: Right Entrance Stairs-Number of Steps: 3 Home Layout: One level Home Equipment: [place] - 2 wheels;Bedside commode;Toilet riser;Adaptive equipment;Grab bars - toilet;Grab bars - tub/shower Additional Comments: Dtr states pt should not go home due to clutter and pt physical ability. Dtr checks on pt daily however does not stay the night.     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 She also reports that he is a  very dramatic person and very manipulative and is very volatile.     Hand Dominance        Extremity/Trunk Assessment               Lower Extremity Assessment: Overall WFL for tasks assessed      Cervical / Trunk Assessment: Kyphotic  Communication   Communication: No difficulties  Cognition Arousal/Alertness:  Awake/alert Behavior During Therapy: Restless Overall Cognitive Status: History of cognitive impairments - at baseline                      General Comments      Exercises        Assessment/Plan    PT Assessment Patient needs continued PT services  PT Diagnosis Generalized weakness   PT Problem List Decreased strength;Decreased activity tolerance;Decreased balance;Decreased mobility;Decreased safety awareness  PT Treatment Interventions DME instruction;Gait training;Functional mobility training;Therapeutic activities;Therapeutic exercise;Balance training;Patient/family education   PT Goals (Current goals can be found in the Care Plan section) Acute Rehab PT Goals Patient Stated Goal: To go home after rehab PT Goal Formulation: With patient/family Potential to Achieve Goals: Fair    Frequency Min 3X/week   Barriers to discharge        Co-evaluation               End of Session Equipment Utilized During Treatment: Gait belt Activity Tolerance: Patient tolerated treatment well Patient left: in chair;with call bell/phone within reach;with family/visitor present;with nursing/sitter in room Nurse Communication: Mobility status         Time: 1460-4799 PT Time Calculation (min) (ACUTE ONLY): 40 min   Charges:   PT Evaluation $Initial PT Evaluation Tier I: 1 Procedure PT Treatments $Gait Training: 8-22 mins   PT G Codes:        Raziyah Vanvleck 27-Aug-2015, 2:20 PM  Antoine Poche, [place] DPT [phone]

## 2015-08-06 ENCOUNTER — Inpatient Hospital Stay (HOSPITAL_COMMUNITY): Payer: Medicare Other

## 2015-08-06 DIAGNOSIS — J690 Pneumonitis due to inhalation of food and vomit: Secondary | ICD-10-CM | POA: Insufficient documentation

## 2015-08-06 DIAGNOSIS — Z7189 Other specified counseling: Secondary | ICD-10-CM

## 2015-08-06 LAB — COMPREHENSIVE METABOLIC PANEL
ALT: 15 U/L — AB (ref 17–63)
AST: 30 U/L (ref 15–41)
Albumin: 2.3 g/dL — ABNORMAL LOW (ref 3.5–5.0)
Alkaline Phosphatase: 91 U/L (ref 38–126)
Anion gap: 12 (ref 5–15)
BUN: 28 mg/dL — AB (ref 6–20)
CHLORIDE: 107 mmol/L (ref 101–111)
CO2: 20 mmol/L — AB (ref 22–32)
CREATININE: 0.69 mg/dL (ref 0.61–1.24)
Calcium: 8.3 mg/dL — ABNORMAL LOW (ref 8.9–10.3)
GFR calc Af Amer: >60 mL/min (ref >60)
GFR calc non Af Amer: >60 mL/min (ref >60)
Glucose, Bld: 100 mg/dL — ABNORMAL HIGH (ref 65–99)
Potassium: 4.2 mmol/L (ref 3.5–5.1)
SODIUM: 139 mmol/L (ref 135–145)
Total Bilirubin: 0.3 mg/dL (ref 0.3–1.2)
Total Protein: 6 g/dL — ABNORMAL LOW (ref 6.5–8.1)

## 2015-08-06 LAB — CBC WITH DIFFERENTIAL/PLATELET
BASOS ABS: 0 10*3/uL (ref 0.0–0.1)
Basophils Relative: 0 %
EOS PCT: 0 %
Eosinophils Absolute: 0 10*3/uL (ref 0.0–0.7)
HCT: 33.1 % — ABNORMAL LOW (ref 39.0–52.0)
Hemoglobin: 11.3 g/dL — ABNORMAL LOW (ref 13.0–17.0)
LYMPHS PCT: 10 %
Lymphs Abs: 1.2 10*3/uL (ref 0.7–4.0)
MCH: 31.9 pg (ref 26.0–34.0)
MCHC: 34.1 g/dL (ref 30.0–36.0)
MCV: 93.5 fL (ref 78.0–100.0)
Monocytes Absolute: 1.6 10*3/uL — ABNORMAL HIGH (ref 0.1–1.0)
Monocytes Relative: 14 %
Neutro Abs: 8.6 10*3/uL — ABNORMAL HIGH (ref 1.7–7.7)
Neutrophils Relative %: 76 %
PLATELETS: 392 10*3/uL (ref 150–400)
RBC: 3.54 MIL/uL — AB (ref 4.22–5.81)
RDW: 18.9 % — ABNORMAL HIGH (ref 11.5–15.5)
WBC: 11.4 10*3/uL — AB (ref 4.0–10.5)

## 2015-08-06 LAB — PROCALCITONIN: Procalcitonin: 0.71 ng/mL

## 2015-08-06 MED ORDER — SODIUM CHLORIDE 0.9 % IJ SOLN: 3.0000 mL | INTRAMUSCULAR | Status: DC | PRN

## 2015-08-06 MED ORDER — HALOPERIDOL 1 MG PO TABS: 0.5000 mg | ORAL_TABLET | ORAL | Status: DC | PRN

## 2015-08-06 MED ORDER — ACETAMINOPHEN 650 MG RE SUPP: 650.0000 mg | Freq: Four times a day (QID) | RECTAL | Status: DC | PRN

## 2015-08-06 MED ORDER — ACETAMINOPHEN 325 MG PO TABS
650.0000 mg | ORAL_TABLET | Freq: Four times a day (QID) | ORAL | Status: DC | PRN
  Administered 2015-08-06: 650 mg via ORAL
  Filled 2015-08-06: qty 2

## 2015-08-06 MED ORDER — HALOPERIDOL LACTATE 5 MG/ML IJ SOLN: 0.5000 mg | INTRAMUSCULAR | Status: DC | PRN

## 2015-08-06 MED ORDER — SODIUM CHLORIDE 0.9 % IJ SOLN
3.0000 mL | Freq: Two times a day (BID) | INTRAMUSCULAR | Status: DC
  Administered 2015-08-06: 3 mL via INTRAVENOUS

## 2015-08-06 MED ORDER — ALBUTEROL SULFATE (2.5 MG/3ML) 0.083% IN NEBU
2.5000 mg | INHALATION_SOLUTION | RESPIRATORY_TRACT | Status: DC | PRN
  Administered 2015-08-06: 2.5 mg via RESPIRATORY_TRACT
  Filled 2015-08-06: qty 3

## 2015-08-06 MED ORDER — LORAZEPAM 2 MG/ML IJ SOLN: 1.0000 mg | INTRAMUSCULAR | Status: DC | PRN

## 2015-08-06 MED ORDER — HALOPERIDOL LACTATE 2 MG/ML PO CONC
0.5000 mg | ORAL | Status: DC | PRN
Start: 2015-08-06 — End: 2015-08-07
  Filled 2015-08-06: qty 0.3

## 2015-08-06 MED ORDER — LORAZEPAM 2 MG/ML PO CONC: 1.0000 mg | ORAL | Status: DC | PRN

## 2015-08-06 MED ORDER — FENTANYL CITRATE (PF) 100 MCG/2ML IJ SOLN
50.0000 ug | INTRAMUSCULAR | Status: DC | PRN
  Administered 2015-08-06 – 2015-08-07 (×6): 50 ug via INTRAVENOUS
  Filled 2015-08-06 (×6): qty 2

## 2015-08-06 MED ORDER — LORAZEPAM 1 MG PO TABS: 1.0000 mg | ORAL_TABLET | ORAL | Status: DC | PRN

## 2015-08-06 MED ORDER — SODIUM CHLORIDE 0.9 % IV SOLN: 250.0000 mL | INTRAVENOUS | Status: DC | PRN

## 2015-08-06 MED ORDER — BISACODYL 10 MG RE SUPP: 10.0000 mg | Freq: Every day | RECTAL | Status: DC | PRN

## 2015-08-06 NOTE — Progress Notes (Signed)
Daily Progress Note   Patient Name: [name] [name]       Date: 08/06/2015 DOB: [birth year]  Age: 81 y.o. MRN#: [id number] Attending Physician: Modena Jansky, MD Primary Care Physician: [name], MD Admit Date: 07/31/2015  Reason for Consultation/Follow-up: Establish GOC, pain management  Subjective: 81 y.o. male with a past medical history significant for chronic dysphagia, myasthenia gravis, RA, alcoholic cirrhosis, chronic systolic and diastolic CHF (EF 36% in 10/4429), HTN, COPD, aFib and complete heart block with pacemaker, not on warfarin reportedly because of fall risk admitted after presenting with increasing pain with swallowing with resulting dehydration, frailty, and weight loss.  He has had pain with swallowing and dysphagia for a year with evaluation by GI as well as ENT.  Palliative consulted for GOC and symptom management.    Interval Events: I saw [name] [name] this morning.  He has had an acute change in his condition and can no longer meaningfully interact.    Dr. Algis Liming and I met with [name] [name]' daughter, [name] to discuss overnight events. He spiked a fever and has a chest x-ray consistent with multilobar pneumonia/aspiration.    We talked about options moving forward, including aggressively trying to treat this new pneumonia versus pursuing a course focused strictly on [name] [name] comfort.  His daughter reports that he has been very consistent in stating that if he were to come more sick that we should strictly focus on his comfort. This is also in line with conversations I have had with him myself during this admission.  We discussed pathways moving forward to focus on comfort. She is in agreement with not starting antibiotics and providing medications on as-needed basis to ensure that her father is comfortable. She is also in agreement with pursuing placement at [facility] as he has stated on multiple occasions that he does not want to be in the hospital.  Length  of Stay: 5 days  Current Medications: Scheduled Meds:  . antiseptic oral rinse  7 mL Mouth Rinse q12n4p  . azaTHIOprine  50 mg Oral BID  . chlorhexidine  15 mL Mouth Rinse BID  . fentaNYL  75 mcg Transdermal Q72H  . OLANZapine zydis  5 mg Oral QHS  . sodium chloride  3 mL Intravenous Q12H    Continuous Infusions:    PRN Meds: sodium chloride, acetaminophen **OR** acetaminophen, albuterol, bisacodyl, fentaNYL (SUBLIMAZE) injection, haloperidol **OR** haloperidol **OR** haloperidol lactate, LORazepam **OR** LORazepam **OR** LORazepam, sodium chloride  Palliative Performance Scale: 10%     Vital Signs: BP 126/55 mmHg  Pulse 60  Temp(Src) 101.9 F (38.8 C) (Oral)  Resp 18  Ht $R'5\' 10"'gb$  (1.778 m)  Wt 59.376 kg (130 lb 14.4 oz)  BMI 18.78 kg/m2  SpO2 90% SpO2: SpO2: 90 % O2 Device: O2 Device: Not Delivered O2 Flow Rate: O2 Flow Rate (L/min): 2 L/min  Intake/output summary:   Intake/Output Summary (Last 24 hours) at 08/06/15 1104 Last data filed at 08/06/15 1046  Gross per 24 hour  Intake    300 ml  Output    975 ml  Net   -675 ml   LBM:   Baseline Weight: Weight: 59.376 kg (130 lb 14.4 oz) Most recent weight: Weight: 59.376 kg (130 lb 14.4 oz)  Physical Exam:  General: Cachectic elderly male. Eyes are closed on entering the room. He does open them briefly with tactile stimulation but is not able to maintain any meaningful conversation. He does not respond to verbal stimulation or follow  any commands. This is an acute change from his condition last evening.  HEENT: Dry mucous membranes. Plaque on tongue.  Cardiovascular: Tachycardic. No murmur appreciated  Respiratory: Decreased air movement. Scattered rhonchi.  Abdomen: Soft nontender nondistended  Ext: No clubbing cyanosis or edema.   Additional Data Reviewed: Recent Labs     08/06/15  0904  WBC  11.4*  HGB  11.3*  PLT  392  NA  139  BUN  28*  CREATININE  0.69     Problem List:  Patient Active  Problem List   Diagnosis Date Noted  . Intractable pain   . Palliative care encounter   . Dehydration 08/01/2015  . Failure to thrive in adult 07/31/2015  . Odynophagia 07/31/2015  . Protein-calorie malnutrition, severe ([place]) 06/15/2015  . Hoarding disorder with poor insight 11/29/2014  . Alcohol abuse 11/26/2014  . COPD (chronic obstructive pulmonary disease) ([place]) 11/26/2014  . Anemia due to other cause 11/26/2014  . Chronic combined systolic and diastolic congestive heart failure ([place]) 03/10/2014  . Herpes zoster 12/20/2013  . SOB (shortness of breath) 11/04/2013  . CHF (congestive heart failure) ([place]) 11/03/2013  . Dyspnea 11/03/2013  . CAP (community acquired pneumonia): Probable 11/03/2013  . COPD with acute exacerbation: Probable 11/03/2013  . Bronchitis: Probable 11/03/2013  . Postural dizziness 02/13/2012  . CAD (coronary artery disease) 03/15/2011  . Essential hypertension, benign 12/13/2010  . Complete heart block ([place]) 12/13/2010  . Atrial fibrillation ([place]) 12/13/2010  . Anxiety state 03/04/2008  . Depression 03/04/2008  . EXTERNAL HEMORRHOIDS 03/04/2008  . ASTHMA 03/04/2008  . GERD 03/04/2008  . DIVERTICULOSIS, COLON 03/04/2008  . NEPHROLITHIASIS 03/04/2008  . Rheumatoid arthritis ([place]) 03/04/2008  . Myasthenia gravis ([place]) 03/04/2008  . COLONIC POLYPS, HYPERPLASTIC 02/04/2006     Palliative Care Assessment & Plan    Code Status:  DNR  Goals of Care:  [name] [name] has had a very abrupt change in his condition. Overall, his clinical course has been one of decline the last several months. In her multiple conversations this admission he is said "quality of life is more important than life."  He is stated that his goal was to return to his home and that if this were not possible we should focus strictly on his comfort.  Based upon his condition today with new aspiration pneumonia Dr. Algis Liming and I met with the patient's daughter. He appears to be transitioning  to actively dying. He is stated that he does not want to be in the hospital when this occurs.  We are going to transition to focus strictly on his comfort. I will also ask social work to place referral to [facility] as his daughter would like to pursue placement there.  Symptom Management: - Pain: We'll plan to continue with fentanyl patch for long acting. He had been taking oxycodone with good effect, however he is no longer able to tolerate by mouth. We'll therefore plan to transition back to fentanyl 50 g every 30 minutes on as-needed basis. If this is insufficient in relieving his pain or shortness of breath we will continue to titrate upward as he has a history high threshold for opioid medications. - Anxiety: Plan for additional lorazepam as needed - Excess secretions: Plan for addition of Robinul as needed - Agitation: Plan for addition of Haldol on as-needed basis  Psycho-social/Spiritual:  Desire for further Chaplaincy support:No   Prognosis: He has had a course of continued decline over the last several months and therefore at baseline  is very frail and with little reserve. Now that he has had an acute change in his condition with new aspiration pneumonia and a focus strictly on his comfort, his expected course would be in the matter of days at best.   Discharge Planning: The patient is quickly approaching the end of his life. He has expressed a desire not to die in the hospital and therefore we are pursuing placement at [facility].  If this cannot be arranged, this will be a terminal admission.  Care plan was discussed with Dr. Algis Liming, bedside nursing team, and his daughter  Thank you for allowing the Palliative Medicine Team to assist in the care of this patient.   Time In: 910 Time Out: 950 Total Time 40 Prolonged Time Billed  No    Greater than 50%  of this time was spent counseling and coordinating care related to the above assessment and plan.   Micheline Rough, MD    08/06/2015, 11:04 AM  Please contact Palliative Medicine Team phone at [phone] for questions and concerns.

## 2015-08-06 NOTE — Progress Notes (Signed)
[facility] PROGRESS NOTE  BUNYAN BRIER QGB:[id number] DOB: 02-Dec-1928 DOA: 07/31/2015 PCP: [name], MD  Summary 81 y.o. male with a past medical history significant for chronic dysphagia, myasthenia gravis, RA, alcoholic cirrhosis, chronic systolic and diastolic CHF (EF 97% in 02/8831), HTN, COPD, aFib and complete heart block with pacemaker, not on warfarin reportedly because of fall risk who presents with increasing pain with swallowing.  All history collected from the patient who is an adequate historian and from chart review. The patient reports a 1 year history of dysphagia and pain with swallowing. Review of old records reveals that the patient has had 1 year of pain:  -Around the start of this, he had dysphagia and then endoscopy that showed strictures which were treated by Dr. Fuller Plan. This dilation helped the dysphagia until two months ago, when the patient developed right neck pain.  -This new neck pain was treated with PPI and nystatin liquid, neither of which helped.  -The patient had a CT of the neck which was unremarkable.  -He was admitted for pneumonia in the midst of this, and had a swallow evaluation during that time that wasconsistent with mild pharyngeal dysphagia with minimal penetration.  -After that, he had a repeat endoscopy that showed nothing and was referred to ENT.  -He saw Dr. Redmond Baseman from [facility], who reportedly did an office laryngoscopy that was negative.  In the last two weeks, this pain has worsened, is now severe, located in the upper right neck, and excruciating with attempting to swallow anything. Because of pain, the patient hasn't eaten anything in two days PTA and was too weak to stand alone, and so his daughter brought him to the ER.  In the ED, the patient was afebrile and hemodynamically stable. He had an elevated BUN-creatinine ratio, and was weak with standing. His pain was adequately controlled with IV fentanyl, but  he could not swallow without excruciating pain  Assessment/Plan:  Principal Problem:   Odynophagia:  - had recent endoscopy, reported laryngoscopy with ENT, speech evaluation, imaging, rx with PPI and nystatin, all without improvement or explanation of pain/dysphagia.  - Dr. Conley Canal D/w Dr. Joylene Draft and reviewed last office note. He had planned on referring to neurosurgery. Had CT neck last month. D/w radiology. DDD present, but no spur big enough to impact swallow.  Cant get MRI due to pacemaker.  Nevertheless, not a candidate for spinal surgery if this is the etiology.  - Carotodynia?  Appreciate palliative team assistance for pain management and GOC. Pt does not want feeding tube. Would be hospice appropriate.  Swallowing improved slightly. Also with white plaque on tongue 10/13. Trial of IV diflucan.   - Speech therapy evaluated 10/14 and recommends dysphagia 1 diet and thin liquids.  - Was improving until sometime last night when he had abrupt decline from aspiration pneumonia. - Palliative care team met with patient/family and transition to comfort care on 10/16. Daughter does not wish to pursue any further aggressive care i.e labs, antibiotics etc.  Active Problems:   Aspiration pneumonia - Witnessed aspiration by RN on 10/15. Since last night, started spiking high temperatures. Today, unresponsive when I went to meet him at about Noon. This M.D. and palliative care M.D. discussed extensively with patient's daughter Ms. Rebecca-updated graves situation of current sepsis from aspiration pneumonia and high risk of recurrence even if he were to recover from this with aggressive treatment. Overall prognosis is poor. Options provided regarding continued aggressive care knowing overall poor prognosis versus  comfort oriented care. Based on patient's wishes as indicated by her and as discussed by patient with palliative care M.D., he was transitioned to comfort care on 10/16. He may demise in the  hospital otherwise for [facility] 08-30-2023 pending bed availability.    Rheumatoid arthritis ([place]) - Patient had acute pain and arthritis flareup in his left wrist on 10/14 . X-rays showed degenerative changes without fractures. His left upper extremity pain was more likely secondary to this than cardiac etiology. No chest pain reported. - Continue azathioprine - Treated with a dose of IV Solu-Medrol on 10/14 with resolution of symptoms     Myasthenia gravis ([place])    Essential hypertension, benign - Controlled.     Complete heart block ([place]), s/p PPM/A. fib/CAD/chronic combined CHF - Stable    COPD (chronic obstructive pulmonary disease) (H[facility]) - Stable    Anemia due to other cause    Protein-calorie malnutrition, severe ([facility])    Failure to thrive in adult   DVT prophylaxis: Lovenox Code Status:  DO NOT RESUSCITATE/Comfort Care Family Communication:  Daughter at bedside Ms. [name] on 10/16.  Disposition Plan: Hospital death Vs DC to [facility] 08/30/23  Consultants:  Palliative medicine  Procedures:   None  Antibiotics:  None  HPI/Subjective: Unresponsive  Objective: Filed Vitals:   08/05/15 1238 08/05/15 1400 08/05/15 2100 08/06/15 0604  BP:  95/38  126/55  Pulse:  58  60  Temp:  97.9 F (36.6 C)  101.9 F (38.8 C)  TempSrc:  Oral  Oral  Resp:  15  18  Height:      Weight:      SpO2: 92% 98% 94% 90%     Intake/Output Summary (Last 24 hours) at 08/06/15 1504 Last data filed at 08/06/15 1300  Gross per 24 hour  Intake    300 ml  Output    975 ml  Net   -675 ml   Filed Weights   07/31/15 2358  Weight: 59.376 kg (130 lb 14.4 oz)    Exam:   General:  Abrupt turn to the worse compared to yesterday. Lying comfortably propped up in bed. Unresponsive to all stimuli. Mouth breathing. Chyne Stokes resp at times  Neck: Supple no mass noted. Tender to palpation  Cardiovascular: Regular rate rhythm without murmurs gallops rubs  Respiratory:  Reduced  breath sounds bilaterally. No increased work of breathing.  Abdomen: Soft nontender nondistended  Ext: No clubbing cyanosis or edema. Acute arthritic findings of left wrist have completely resolved. Deformities persist.    Basic Metabolic Panel:  Recent Labs Lab 07/31/15 1454 08/01/15 0350 08/06/15 0904  NA 136 139 139  K 4.4 3.9 4.2  CL 97* 102 107  CO2 27 25 20*  GLUCOSE 153* 86 100*  BUN 20 18 28*  CREATININE 0.78 0.72 0.69  CALCIUM 9.2 8.8* 8.3*   Liver Function Tests:  Recent Labs Lab 07/31/15 1454 08/06/15 0904  AST 30 30  ALT 13* 15*  ALKPHOS 108 91  BILITOT 0.6 0.3  PROT 7.5 6.0*  ALBUMIN 2.8* 2.3*    Recent Labs Lab 07/31/15 1454  LIPASE 37    Recent Labs Lab 07/31/15 1601  AMMONIA 31   CBC:  Recent Labs Lab 07/31/15 1454 08/01/15 0350 08/06/15 0904  WBC 6.6 6.5 11.4*  NEUTROABS 3.8  --  8.6*  HGB 14.0 12.5* 11.3*  HCT 40.1 36.2* 33.1*  MCV 93.5 93.8 93.5  PLT 399 381 392   Cardiac Enzymes: No results for  input(s): CKTOTAL, CKMB, CKMBINDEX, TROPONINI in the last 168 hours. BNP (last 3 results)  Recent Labs  11/25/14 1732 06/13/15 0318  BNP 660.0* 461.2*    ProBNP (last 3 results) No results for input(s): PROBNP in the last 8760 hours.  CBG: No results for input(s): GLUCAP in the last 168 hours.  No results found for this or any previous visit (from the past 240 hour(s)).   Studies: Dg Chest 2 View  08/06/2015  CLINICAL DATA:  81 year old male with history of fever and cough. Lethargy. EXAM: CHEST  2 VIEW COMPARISON:  Chest x-ray 07/31/2015. FINDINGS: Patchy interstitial and airspace disease throughout the lungs bilaterally, most confluent throughout the mid to lower lungs. Small right and trace left pleural effusions. Calcified granuloma in the right mid lung is unchanged. There does not appear to be significant cephalization of the pulmonary vasculature. Advanced emphysema again noted. Mild cardiomegaly is unchanged. Upper  mediastinal contours are within normal limits. Atherosclerosis in the thoracic aorta. Left-sided pacemaker device with lead tips projecting over the expected location of the right atrium and right ventricular apex. Multiple old healed right-sided rib fractures again noted. IMPRESSION: 1. Interval development of patchy multifocal interstitial and airspace disease throughout the mid to lower lungs bilaterally, concerning for multilobar pneumonia or sequela of aspiration. 2. Small right and trace left pleural effusions. 3. Atherosclerosis. 4. Mild cardiomegaly. 5. Emphysema. Electronically Signed   By: Vinnie Langton M.D.   On: 08/06/2015 08:51    Scheduled Meds: . antiseptic oral rinse  7 mL Mouth Rinse q12n4p  . azaTHIOprine  50 mg Oral BID  . chlorhexidine  15 mL Mouth Rinse BID  . fentaNYL  75 mcg Transdermal Q72H  . OLANZapine zydis  5 mg Oral QHS  . sodium chloride  3 mL Intravenous Q12H   Continuous Infusions:    Time spent: 89 minutes    HONGALGI,ANAND, MD, FACP, FHM. [facility] Pager [phone]  If 7PM-7AM, please contact night-coverage [email or web address] Password [email or web address] 08/06/2015, 3:04 PM    LOS: 5 days

## 2015-08-06 NOTE — Consult Note (Signed)
[facility] Liaison: Received request from [place] for family interest in [facility]. Chart reviewed. [place] MD note.  Met with patient's HCPOA/daughter in lobby to confirm interest and explain services. Daughter tearful and exhausted, agreeable to transfer to [facility] tomorrow if transfer still makes sense. [facility] will need to confirm Dr. Letta Median as attending per daughter's request. Will follow up with CSW in am.   Thank you.  Erling Conte, [place]

## 2015-08-06 NOTE — Progress Notes (Signed)
Confirmed with Erling Conte, [place], that she received Dr. Kirstie Mirza referral for [facility], per his request.

## 2015-08-07 DIAGNOSIS — Z66 Do not resuscitate: Secondary | ICD-10-CM

## 2015-08-07 DIAGNOSIS — R52 Pain, unspecified: Secondary | ICD-10-CM | POA: Insufficient documentation

## 2015-08-07 DIAGNOSIS — R1310 Dysphagia, unspecified: Secondary | ICD-10-CM

## 2015-08-07 DIAGNOSIS — R627 Adult failure to thrive: Secondary | ICD-10-CM

## 2015-08-07 DIAGNOSIS — J690 Pneumonitis due to inhalation of food and vomit: Secondary | ICD-10-CM

## 2015-08-07 MED ORDER — STARCH (THICKENING) PO POWD
ORAL | Status: DC | PRN
  Filled 2015-08-07: qty 227

## 2015-08-07 MED ORDER — HALOPERIDOL LACTATE 5 MG/ML IJ SOLN: 0.5000 mg | INTRAMUSCULAR | Status: AC | PRN

## 2015-08-07 MED ORDER — FENTANYL 75 MCG/HR TD PT72: 75.0000 ug | MEDICATED_PATCH | TRANSDERMAL | Status: AC

## 2015-08-07 MED ORDER — WHITE PETROLATUM GEL
Status: AC
  Filled 2015-08-07: qty 1

## 2015-08-07 MED ORDER — BISACODYL 10 MG RE SUPP: 10.0000 mg | Freq: Every day | RECTAL | Status: AC | PRN

## 2015-08-07 MED ORDER — LORAZEPAM 2 MG/ML IJ SOLN: 1.0000 mg | INTRAMUSCULAR | Status: AC | PRN

## 2015-08-07 MED ORDER — OLANZAPINE 5 MG PO TBDP: 5.0000 mg | ORAL_TABLET | Freq: Every day | ORAL | Status: AC

## 2015-08-07 MED ORDER — FENTANYL CITRATE (PF) 100 MCG/2ML IJ SOLN: 50.0000 ug | INTRAMUSCULAR | Status: AC | PRN

## 2015-08-07 NOTE — Progress Notes (Signed)
1145 50 mcg Fentayl wasted with Arbie Cookey, RN

## 2015-08-07 NOTE — Discharge Summary (Signed)
Physician Discharge Summary  [name] [name] IRS:[id number] DOB: 1928/11/03 DOA: 07/31/2015  PCP: [name], MD  Admit date: 07/31/2015 Discharge date: 08/07/2015  Time spent: 35 minutes  Recommendations for Outpatient Follow-up:  1. DNR to [facility]  Discharge Diagnoses:  Principal Problem:   Odynophagia Active Problems:   Rheumatoid arthritis ([place])   Myasthenia gravis ([place])   Essential hypertension, benign   Complete heart block (HCC)   Atrial fibrillation (HCC)   CAD (coronary artery disease)   Chronic combined systolic and diastolic congestive heart failure (HCC)   COPD (chronic obstructive pulmonary disease) (HCC)   Anemia due to other cause   Protein-calorie malnutrition, severe (HCC)   Failure to thrive in adult   Dehydration   Intractable pain   Palliative care encounter   Aspiration pneumonia of both lower lobes due to regurgitated food [facility])   Discharge Condition: terminal  Diet recommendation: DYs 1 nectar thick liq  Filed Weights   07/31/15 2358  Weight: 59.376 kg (130 lb 14.4 oz)    History of present illness:  [name] [name] is a 81 y.o. male with a past medical history significant for chronic dysphagia, myasthenia gravis, RA, alcoholic cirrhosis, chronic systolic and diastolic CHF (EF 00% in 06/3817), HTN, COPD, aFib and complete heart block with pacemaker, not on warfarin reportedly because of fall risk who presents with increasing pain with swallowing.  All history collected from the patient who is an adequate historian and from chart review. The patient reports a 1 year history of dysphagia and pain with swallowing. Review of old records reveals that the patient has had 1 year of pain:  -Around the start of this, he had dysphagia and then endoscopy that showed strictures which were treated by Dr. Fuller Plan. This dilation helped the dysphagia until two months ago, when the patient developed right neck pain.  -This new neck pain was treated with PPI  and nystatin liquid, neither of which helped.  -The patient had a CT of the neck which was unremarkable.  -He was admitted for pneumonia in the midst of this, and had a swallow evaluation during that time that wasconsistent with mild pharyngeal dysphagia with minimal penetration.  -After that, he had a repeat endoscopy that showed nothing and was referred to ENT.  -He saw Dr. Redmond Baseman from [facility], who reportedly did an office laryngoscopy that was negative.  In the last two weeks, this pain has worsened, is now severe, located in the upper right neck, and excruciating with attempting to swallow anything. Because of pain, the patient hasn't eaten anything in two days and today was too weak to stand alone, and so his daughter brought him to the ER.  In the ED, the patient was afebrile and hemodynamically stable. He had an elevated BUN-creatinine ratio, and was weak with standing. His pain was adequately controlled with IV fentanyl, but he could not swallow without excruciating pain. Family had left and did not return calls for more information.     Hospital Course:  Odynophagia:  - had recent endoscopy, reported laryngoscopy with ENT, speech evaluation, imaging, rx with PPI and nystatin, all without improvement or explanation of pain/dysphagia.  - Dr. Conley Canal D/w Dr. Joylene Draft and reviewed last office note. He had planned on referring to neurosurgery. Had CT neck last month. D/w radiology. DDD present, but no spur big enough to impact swallow. Cant get MRI due to pacemaker. Nevertheless, not a candidate for spinal surgery if this is the etiology.  - Carotodynia?  Appreciate palliative team assistance for pain management and GOC. Pt does not want feeding tube. Would be hospice appropriate. Swallowing improved slightly. Also with white plaque on tongue 10/13. Trial of IV diflucan.  - Speech therapy evaluated 10/14 and recommends dysphagia 1 diet and thin liquids.  - Was  improving until sometime last night when he had abrupt decline from aspiration pneumonia. - Palliative care team met with patient/family and transition to comfort care on 10/16. Daughter does not wish to pursue any further aggressive care i.e labs, antibiotics etc.  Active Problems:  Aspiration pneumonia - Witnessed aspiration by RN on 10/15. Since last night, started spiking high temperatures. Today, unresponsive when I went to meet him at about Noon. This M.D. and palliative care M.D. discussed extensively with patient's daughter Ms. Rebecca-updated graves situation of current sepsis from aspiration pneumonia and high risk of recurrence even if he were to recover from this with aggressive treatment. Overall prognosis is poor. Options provided regarding continued aggressive care knowing overall poor prognosis versus comfort oriented care. Based on patient's wishes as indicated by her and as discussed by patient with palliative care M.D., he was transitioned to comfort care on 10/16. He may demise in the hospital otherwise for [facility] 10/17 pending bed availability.   Rheumatoid arthritis ([place]) - Patient had acute pain and arthritis flareup in his left wrist on 10/14 . X-rays showed degenerative changes without fractures. His left upper extremity pain was more likely secondary to this than cardiac etiology. No chest pain reported. - Continue azathioprine - Treated with a dose of IV Solu-Medrol on 10/14 with resolution of symptoms    Myasthenia gravis ([place])   Essential hypertension, benign - Controlled.   Complete heart block ([place]), s/p PPM/A. fib/CAD/chronic combined CHF - Stable   COPD (chronic obstructive pulmonary disease) ([place]) - Stable   Anemia due to other cause   Protein-calorie malnutrition, severe (HCC)   Failure to thrive in adult   Procedures:    Consultations:  Palliative care  Discharge Exam: Filed Vitals:   08/07/15 0625  BP: 109/57  Pulse: 61   Temp: 99.4 F (37.4 C)  Resp: 15    General: appears uncomfortable Cardiovascular: rrr Coughing while eating breakfast  Discharge Instructions    Current Discharge Medication List    START taking these medications   Details  bisacodyl (DULCOLAX) 10 MG suppository Place 1 suppository (10 mg total) rectally daily as needed for moderate constipation. Qty: 12 suppository, Refills: 0    fentaNYL (DURAGESIC - DOSED MCG/HR) 75 MCG/HR Place 1 patch (75 mcg total) onto the skin every 3 (three) days. Qty: 5 patch, Refills: 0    fentaNYL (SUBLIMAZE) 100 MCG/2ML injection Inject 1 mL (50 mcg total) into the vein every 30 (thirty) minutes as needed for severe pain (or dyspnea). Qty: 2 mL, Refills: 0    haloperidol lactate (HALDOL) 5 MG/ML injection Inject 0.1 mLs (0.5 mg total) into the vein every 4 (four) hours as needed (or delirium). Qty: 1 mL    LORazepam (ATIVAN) 2 MG/ML injection Inject 0.5 mLs (1 mg total) into the vein every 4 (four) hours as needed for anxiety. Qty: 1 mL, Refills: 0    OLANZapine zydis (ZYPREXA) 5 MG disintegrating tablet Take 1 tablet (5 mg total) by mouth at bedtime.      CONTINUE these medications which have NOT CHANGED   Details  azaTHIOprine (IMURAN) 50 MG tablet Take 50 mg by mouth 2 (two) times daily.  STOP taking these medications     aspirin EC 81 MG tablet      augmented betamethasone dipropionate (DIPROLENE-AF) 0.05 % ointment      budesonide-formoterol (SYMBICORT) 160-4.5 MCG/ACT inhaler      docusate sodium (COLACE) 100 MG capsule      escitalopram (LEXAPRO) 20 MG tablet      esomeprazole (NEXIUM) 40 MG capsule      FIBER PO      finasteride (PROSCAR) 5 MG tablet      furosemide (LASIX) 40 MG tablet      KLOR-CON M20 20 MEQ tablet      morphine (MSIR) 15 MG tablet      nystatin (MYCOSTATIN) 100000 UNIT/ML suspension      Probiotic Product ([facility] [employer]) CAPS      spironolactone (ALDACTONE) 50 MG tablet       temazepam (RESTORIL) 30 MG capsule      Omega-3 Fatty Acids (FISH OIL) 1000 MG CAPS        Allergies  Allergen Reactions  . Celebrex [Celecoxib] Nausea And Vomiting and Other (See Comments)    Study med program at [facility]  Stomach ulcers and bleeding  . Remicade [Infliximab] Other (See Comments)    Study med program at [facility]   . Alendronate Sodium Other (See Comments)    Unknown.  . Benazepril Other (See Comments)    Unknown.      The results of significant diagnostics from this hospitalization (including imaging, microbiology, ancillary and laboratory) are listed below for reference.    Significant Diagnostic Studies: Dg Chest 2 View  08/06/2015  CLINICAL DATA:  81 year old male with history of fever and cough. Lethargy. EXAM: CHEST  2 VIEW COMPARISON:  Chest x-ray 07/31/2015. FINDINGS: Patchy interstitial and airspace disease throughout the lungs bilaterally, most confluent throughout the mid to lower lungs. Small right and trace left pleural effusions. Calcified granuloma in the right mid lung is unchanged. There does not appear to be significant cephalization of the pulmonary vasculature. Advanced emphysema again noted. Mild cardiomegaly is unchanged. Upper mediastinal contours are within normal limits. Atherosclerosis in the thoracic aorta. Left-sided pacemaker device with lead tips projecting over the expected location of the right atrium and right ventricular apex. Multiple old healed right-sided rib fractures again noted. IMPRESSION: 1. Interval development of patchy multifocal interstitial and airspace disease throughout the mid to lower lungs bilaterally, concerning for multilobar pneumonia or sequela of aspiration. 2. Small right and trace left pleural effusions. 3. Atherosclerosis. 4. Mild cardiomegaly. 5. Emphysema. Electronically Signed   By: Vinnie Langton M.D.   On: 08/06/2015 08:51   Dg Wrist Complete Left  08/04/2015  CLINICAL DATA:  Pain and swelling  for 1 day without known trauma. EXAM: LEFT WRIST - COMPLETE 3+ VIEW COMPARISON:  09/09/2011. FINDINGS: Advanced osteopenia and multifocal osteoarthritis. Vascular calcifications. Joint space narrowing is marked throughout the carpal bones and at the base of the thumb. Scaphoid intact. IMPRESSION: Degenerative change, without acute osseous finding. Electronically Signed   By: Abigail Miyamoto M.D.   On: 08/04/2015 15:03   Ct Abdomen Pelvis W Contrast  07/31/2015  CLINICAL DATA:  Diffuse abdominal pain, malaise, weight loss EXAM: CT ABDOMEN AND PELVIS WITH CONTRAST TECHNIQUE: Multidetector CT imaging of the abdomen and pelvis was performed using the standard protocol following bolus administration of intravenous contrast. CONTRAST:  112mL OMNIPAQUE IOHEXOL 300 MG/ML  SOLN COMPARISON:  02/05/2013 FINDINGS: Lower chest: Consolidation inferior aspect of the right middle lobe most consistent  with atelectasis. Calcification inferior right hilum measuring about 1 cm suggesting calcified adenopathy. Focus of parenchymal calcification posterior lateral right lung base measuring about 1 cm, stable when compared to prior study. Mitral valve calcification. Coronary artery calcification. Pacer leads noted. Small partially loculated right pleural effusions similar to prior study. Cardiac enlargement. Hepatobiliary: Mottled enhancement pattern in the liver with relatively decreased enhancement peripherally seen on the portal phase, less apparent on delayed images. This is consistent with hepatic venous congestion likely of cardiac origin.Mildly nodular hepatic contour suggesting cirrhosis likely also related to the hepatic parenchymal heterogeneity. Pancreas: Negative Spleen: Absent Adrenals/Urinary Tract: No masses identified. No evidence of hydronephrosis. Stomach/Bowel: There is significant diverticulosis of the sigmoid colon, which is too large degree obscured by beam attenuation artifact from left hip replacement. There is  wall thickening and narrowing of ascending and proximal transverse colon suggesting possibility of chronic inflammation. Nonobstructive bowel gas pattern present. Vascular/Lymphatic: Mild to moderate atherosclerotic calcification of the aortoiliac vessels. Reproductive: No mass or other significant abnormality. Other: There may be a small volume of free fluid in the pelvis as seen to the right side posterior to the bladder on image number 67. Limited evaluation of this area due to beam attenuation artifact from left femur. Musculoskeletal: Left hip replacement causes significant beam attenuation artifact, limiting this study. Chronic changes left acetabulum. Severe degenerative change right hip joint. IMPRESSION: Images of the inferior pelvis limited by beam hardening artifact arising from left hip replacement. Diverticulosis sigmoid colon, with no diverticulitis appreciated allowing for limitations discussed above. Trace ascites in the pelvis, cause uncertain. It is possible that it may be due to fluid overload, as there is cardiac enlargement and the appearance of the liver suggests venous congestion. Alternatively it may be related to cirrhosis, which is suspected based on liver contour. Evidence to suggest chronic inflammation of the proximal half of the colon. Electronically Signed   By: Skipper Cliche M.D.   On: 07/31/2015 19:33   Dg Chest Portable 1 View  07/31/2015  CLINICAL DATA:  Weakness. Hypertension. Coronary artery disease. COPD. EXAM: PORTABLE CHEST 1 VIEW COMPARISON:  07/05/2015 FINDINGS: Reverse lordotic projection. Dual lead pacer, proximal and distal leads projecting over the right atrium and ventricle, respectively. Mild enlargement of the cardiopericardial silhouette, without edema. Blunting of the right lateral costophrenic angle compatible small right pleural effusion with passive atelectasis. Calcified right lower lobe pulmonary nodules, one near the lung base and the other near the  minor fissure, previously worked up by chest CT in 2008 and felt to be benign. Degenerative glenohumeral arthropathy bilaterally with loss of articular space and spurring of the humeral heads. Atherosclerotic calcification of the aortic arch. IMPRESSION: 1. Mild enlargement of the cardiopericardial silhouette, without edema. 2. Small right pleural effusion with passive atelectasis. 3. Right lower lobe calcified granulomas, benign. 4. Atherosclerotic aortic arch 5. Degenerative glenohumeral arthropathy. Electronically Signed   By: Van Clines M.D.   On: 07/31/2015 15:32    Microbiology: No results found for this or any previous visit (from the past 240 hour(s)).   Labs: Basic Metabolic Panel:  Recent Labs Lab 07/31/15 1454 08/01/15 0350 08/06/15 0904  NA 136 139 139  K 4.4 3.9 4.2  CL 97* 102 107  CO2 27 25 20*  GLUCOSE 153* 86 100*  BUN 20 18 28*  CREATININE 0.78 0.72 0.69  CALCIUM 9.2 8.8* 8.3*   Liver Function Tests:  Recent Labs Lab 07/31/15 1454 08/06/15 0904  AST 30 30  ALT 13*  15*  ALKPHOS 108 91  BILITOT 0.6 0.3  PROT 7.5 6.0*  ALBUMIN 2.8* 2.3*    Recent Labs Lab 07/31/15 1454  LIPASE 37    Recent Labs Lab 07/31/15 1601  AMMONIA 31   CBC:  Recent Labs Lab 07/31/15 1454 08/01/15 0350 08/06/15 0904  WBC 6.6 6.5 11.4*  NEUTROABS 3.8  --  8.6*  HGB 14.0 12.5* 11.3*  HCT 40.1 36.2* 33.1*  MCV 93.5 93.8 93.5  PLT 399 381 392   Cardiac Enzymes: No results for input(s): CKTOTAL, CKMB, CKMBINDEX, TROPONINI in the last 168 hours. BNP: BNP (last 3 results)  Recent Labs  11/25/14 1732 06/13/15 0318  BNP 660.0* 461.2*    ProBNP (last 3 results) No results for input(s): PROBNP in the last 8760 hours.  CBG: No results for input(s): GLUCAP in the last 168 hours.     SignedEulogio Bear  [facility] 08/07/2015, 8:54 AM

## 2015-08-07 NOTE — Progress Notes (Signed)
Utilization review completed.  

## 2015-08-07 NOTE — Progress Notes (Signed)
1328 - wasted Fentanyl 50 mcg IV in sink, with Mayra Neer, RN/ Charlies Silvers, RN

## 2015-08-07 NOTE — Progress Notes (Signed)
0850 50 mcg Fentayl  wasted with carol, RN

## 2015-08-07 NOTE — Progress Notes (Signed)
1145 - witnessed Mayra Neer, Chief Operating Officer Fentanyl 50 mcgs in sink. Charlies Silvers, RN

## 2015-08-07 NOTE — Care Management Important Message (Signed)
Important Message  Patient Details  Name: [name] MRN: [id number] Date of Birth: [birth year]   Medicare Important Message Given:  Yes-third notification given    Delorse Lek 08/07/2015, 2:02 PM

## 2015-08-07 NOTE — Clinical Social Work Note (Signed)
Patient has bed available at [facility]. Patient and patient's family notified by RN.  CSW has arranged for EMS transportation to [facility].  Lubertha Sayres, [place] Clinical Social Work Department Orthopedics [phone]) and Surgical [phone])

## 2015-08-07 NOTE — Progress Notes (Signed)
Report called to Janett Billow at [facility].

## 2015-08-07 NOTE — Progress Notes (Signed)
Witnessed waste of Fentanyl 57mcg. Charlies Silvers, RN

## 2015-08-07 NOTE — Progress Notes (Signed)
1328 50 mcg Fentayl wasted with Arbie Cookey, RN

## 2015-08-07 NOTE — Progress Notes (Addendum)
Daily Progress Note   Patient Name: [name] [name]       Date: 08/07/2015 DOB: 1929-01-25  Age: 81 y.o. MRN#: [id number] Attending Physician: No att. providers found Primary Care Physician: [name] Ly, MD Admit Date: 07/31/2015  Reason for Consultation/Follow-up: Establish GOC, pain management  Subjective: 81 y.o. male with a past medical history significant for chronic dysphagia, myasthenia gravis, RA, alcoholic cirrhosis, chronic systolic and diastolic CHF (EF 41% in 12/2438), HTN, COPD, aFib and complete heart block with pacemaker, not on warfarin reportedly because of fall risk admitted after presenting with increasing pain with swallowing with resulting dehydration, frailty, and weight loss.  He has had pain with swallowing and dysphagia for a year with evaluation by GI as well as ENT.  Palliative consulted for GOC and symptom management.    Interval Events: I saw [name] [name] this morning.  He is more awake than yesterday and sitting on the edge of his bed picking at his breakfast tray.  He has periods of coughing and appears to continue to aspirate during our encounter. He reports that he does not care and wants to continue eating what he wants.  He remains confused overall, but does have some periods of lucidity. I'm not sure how much he actually follows the conversation.   I spoke with him about his current condition and he reports that the doctors have "done their best." He also reports "I'm almost done."  We talked about his prior goals when he stated if he cannot get well enough to go home, he would want to remain comfortable and be out of the hospital. We talked about the best place to continue with this goal would be residential hospice. He did state that he was agreeable to transfer.  Length of Stay: 6 days  Current Medications: Scheduled Meds:  . antiseptic oral rinse  7 mL Mouth Rinse q12n4p  . azaTHIOprine  50 mg Oral BID  . chlorhexidine  15 mL Mouth Rinse BID  .  fentaNYL  75 mcg Transdermal Q72H  . OLANZapine zydis  5 mg Oral QHS  . sodium chloride  3 mL Intravenous Q12H  . white petrolatum        Continuous Infusions:    PRN Meds: sodium chloride, acetaminophen **OR** acetaminophen, albuterol, bisacodyl, fentaNYL (SUBLIMAZE) injection, food thickener, haloperidol **OR** haloperidol **OR** haloperidol lactate, LORazepam **OR** LORazepam **OR** LORazepam, sodium chloride  Palliative Performance Scale: 20%     Vital Signs: BP 109/57 mmHg  Pulse 61  Temp(Src) 99.4 F (37.4 C) (Oral)  Resp 15  Ht 5\' 10"  (1.778 m)  Wt 59.376 kg (130 lb 14.4 oz)  BMI 18.78 kg/m2  SpO2 92% SpO2: SpO2: 92 % O2 Device: O2 Device: Nasal Cannula O2 Flow Rate: O2 Flow Rate (L/min): 2 L/min  Intake/output summary:   Intake/Output Summary (Last 24 hours) at 08/07/15 1631 Last data filed at 08/07/15 1329  Gross per 24 hour  Intake    163 ml  Output   1125 ml  Net   -962 ml   LBM:   Baseline Weight: Weight: 59.376 kg (130 lb 14.4 oz) Most recent weight: Weight: 59.376 kg (130 lb 14.4 oz)  Physical Exam:  General: Cachectic elderly male. Sitting on edge of bed. He is ill to participate in some conversation and is eating some breakfast during our encounter. He continues to have frank aspiration with frequent coughing. Reports he wants to continue eating.  HEENT: Dry mucous membranes. Plaque on tongue.  Cardiovascular: Tachycardic.  No murmur appreciated  Respiratory: Decreased air movement. Scattered rhonchi.  Abdomen: Soft nontender nondistended  Ext: No clubbing cyanosis or edema.   Additional Data Reviewed: Recent Labs     08/06/15  0904  WBC  11.4*  HGB  11.3*  PLT  392  NA  139  BUN  28*  CREATININE  0.69     Problem List:  Patient Active Problem List   Diagnosis Date Noted  . Aspiration pneumonia of both lower lobes due to regurgitated food ([place])   . Intractable pain   . Palliative care encounter   . Dehydration 08/01/2015  .  Failure to thrive in adult 07/31/2015  . Odynophagia 07/31/2015  . Protein-calorie malnutrition, severe ([place]) 06/15/2015  . Hoarding disorder with poor insight 11/29/2014  . Alcohol abuse 11/26/2014  . COPD (chronic obstructive pulmonary disease) ([place]) 11/26/2014  . Anemia due to other cause 11/26/2014  . Chronic combined systolic and diastolic congestive heart failure ([place]) 03/10/2014  . Herpes zoster 12/20/2013  . SOB (shortness of breath) 11/04/2013  . CHF (congestive heart failure) ([place]) 11/03/2013  . Dyspnea 11/03/2013  . CAP (community acquired pneumonia): Probable 11/03/2013  . COPD with acute exacerbation: Probable 11/03/2013  . Bronchitis: Probable 11/03/2013  . Postural dizziness 02/13/2012  . CAD (coronary artery disease) 03/15/2011  . Essential hypertension, benign 12/13/2010  . Complete heart block ([place]) 12/13/2010  . Atrial fibrillation ([place]) 12/13/2010  . Anxiety state 03/04/2008  . Depression 03/04/2008  . EXTERNAL HEMORRHOIDS 03/04/2008  . ASTHMA 03/04/2008  . GERD 03/04/2008  . DIVERTICULOSIS, COLON 03/04/2008  . NEPHROLITHIASIS 03/04/2008  . Rheumatoid arthritis ([place]) 03/04/2008  . Myasthenia gravis ([place]) 03/04/2008  . COLONIC POLYPS, HYPERPLASTIC 02/04/2006     Palliative Care Assessment & Plan    Code Status:  DNR  Goals of Care:  To [employer] today  Symptom Management: - Pain: We'll plan to continue with fentanyl patch for long acting. He had been taking oxycodone with good effect, however he is no longer able to tolerate by mouth. We'll therefore plan to transition back to fentanyl 50 g every 30 minutes on as-needed basis. If this is insufficient in relieving his pain or shortness of breath we will continue to titrate upward as he has a history high threshold for opioid medications. - Anxiety: Plan for lorazepam as needed - Excess secretions: Plan for Robinul as needed - Agitation: Plan for Haldol on as-needed  basis  Psycho-social/Spiritual:  Desire for further Chaplaincy support:No   Prognosis: He has had a course of continued decline over the last several months and therefore at baseline is very frail and with little reserve. Now that he has had an acute change in his condition with new aspiration pneumonia and a focus strictly on his comfort, his expected course would be in the matter of days at best. He is more responsive today than he was yesterday, however, this does not change his overall projected course.  Discharge Planning: To [facility] today  Care plan was discussed with Dr. Eliseo Squires, Erling Conte, bedside nursing team, and his daughter  Thank you for allowing the Palliative Medicine Team to assist in the care of this patient.   Time In: 1145 Time Out: 1230 Total Time 45 Prolonged Time Billed  No    Greater than 50%  of this time was spent counseling and coordinating care related to the above assessment and plan.   Micheline Rough, MD  08/07/2015, 4:31 PM  Please contact Palliative Medicine Team phone  at [phone] for questions and concerns.

## 2015-08-07 NOTE — Progress Notes (Signed)
Nutrition Brief Note  Chart reviewed. Pt now transitioning to comfort care.  No further nutrition interventions warranted at this time.  Please re-consult as needed.   Mikayela Deats A. Haroldine Redler, RD, LDN, CDE Pager: [phone] After hours Pager: [phone]  

## 2015-08-12 LAB — CULTURE, BLOOD (ROUTINE X 2)
CULTURE: NO GROWTH
Culture: NO GROWTH

## 2015-08-22 DEATH — deceased

## 2015-09-07 ENCOUNTER — Ambulatory Visit: Payer: Medicare Other | Admitting: Cardiology

## 2016-07-26 IMAGING — CT CT ABD-PELV W/ CM
2 of 5 series · 15 of 46 positions shown, 17 images · IV contrast (omnipaque)
Comparison: 02/05/2013

CLINICAL DATA: Diffuse abdominal pain, malaise, weight loss

EXAM:
CT ABDOMEN AND PELVIS WITH CONTRAST
TECHNIQUE: Multidetector CT imaging of the abdomen and pelvis was performed
using the standard protocol following bolus administration of
intravenous contrast.
CONTRAST:  100mL OMNIPAQUE IOHEXOL 300 MG/ML  SOLN

[Series 2: a/p w/ 5mm · axial · 0.67mm/px · z∈[+842,+1236]mm · 12 of 89 slices shown, 14 images]
[im 5/89  soft-tissue]
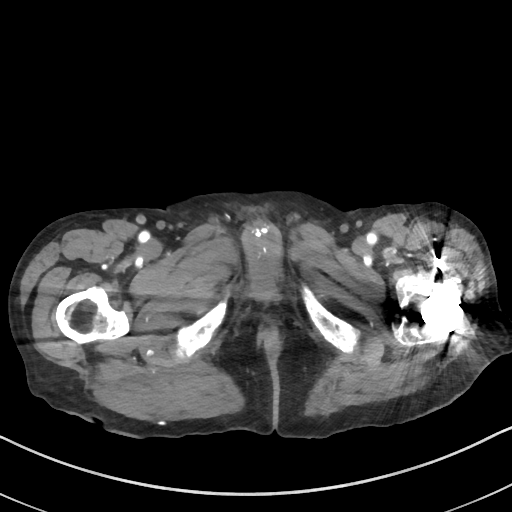
[im 5/89  bone]
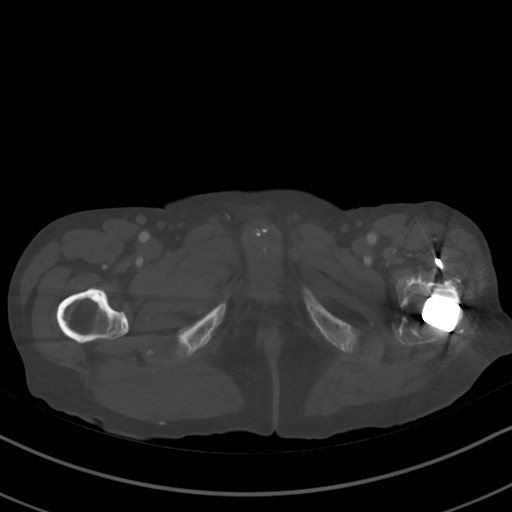
[im 14/89  soft-tissue]
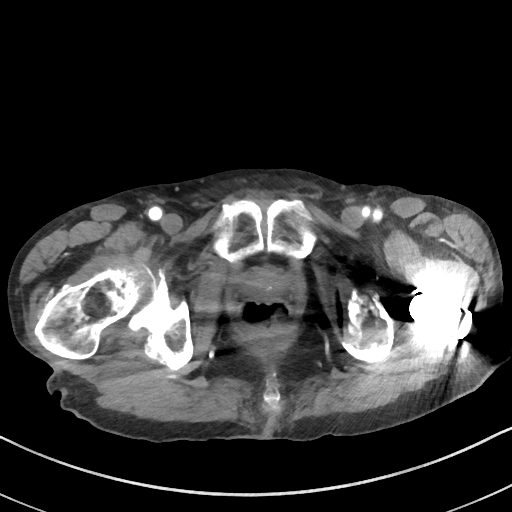
[im 19/89  soft-tissue]
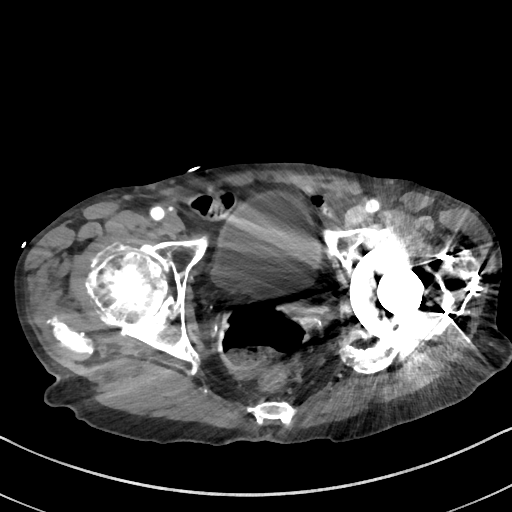
[im 28/89  soft-tissue]
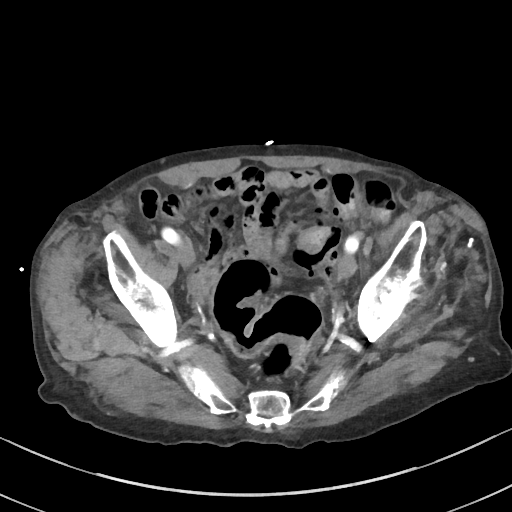
[im 33/89  soft-tissue]
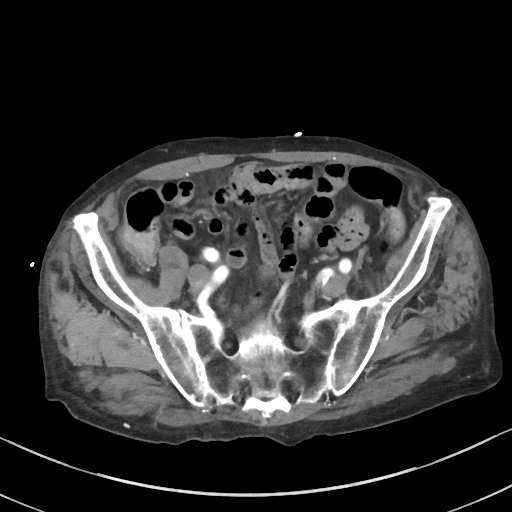
[im 42/89  soft-tissue]
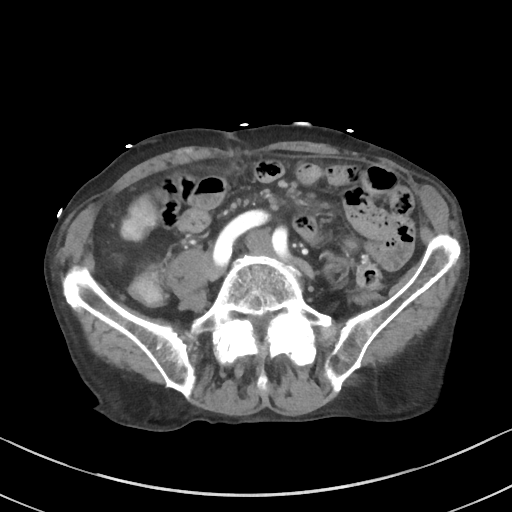
[im 47/89  soft-tissue]
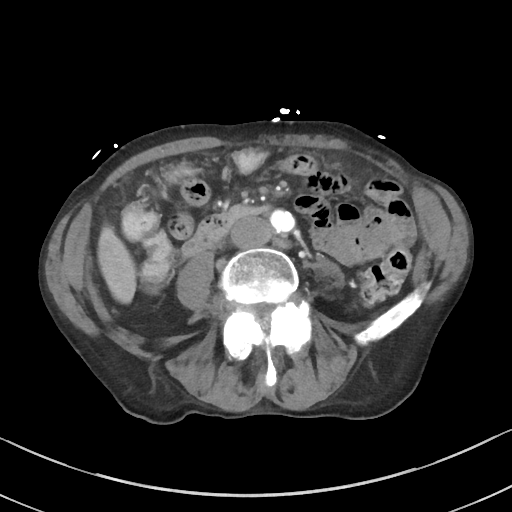
[im 56/89  soft-tissue]
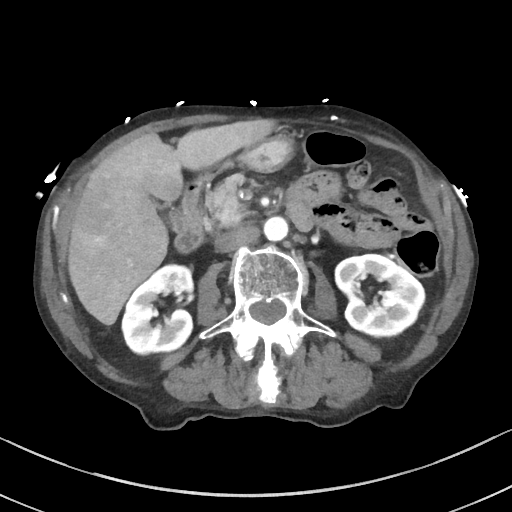
[im 61/89  soft-tissue]
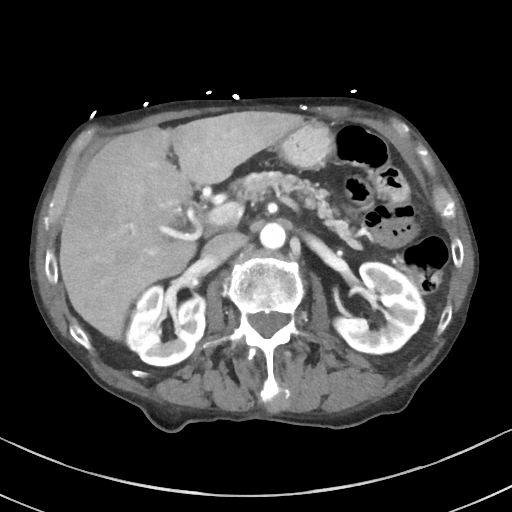
[im 61/89  bone]
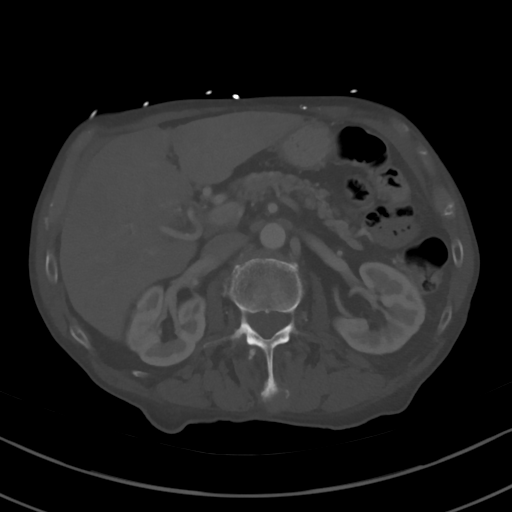
[im 70/89  soft-tissue]
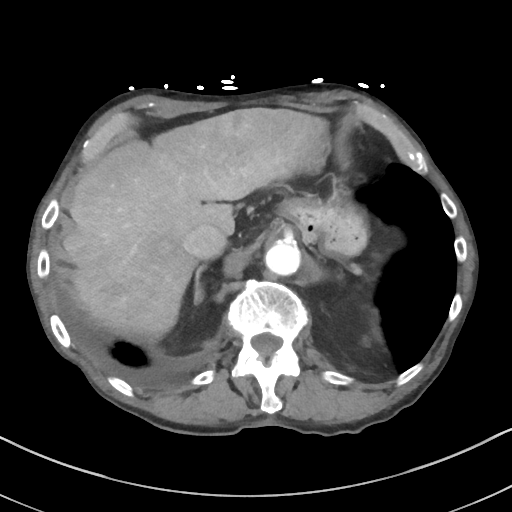
[im 75/89  soft-tissue]
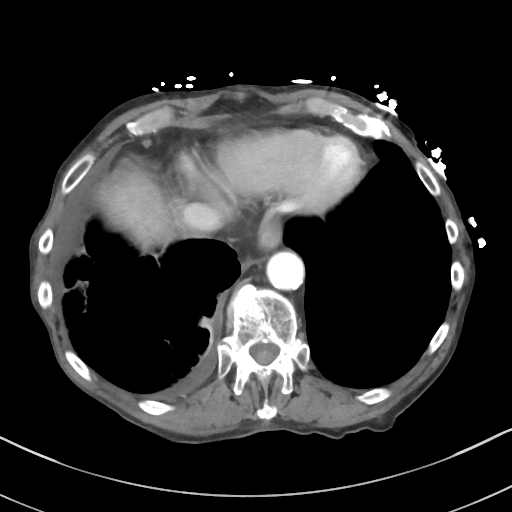
[im 84/89  soft-tissue]
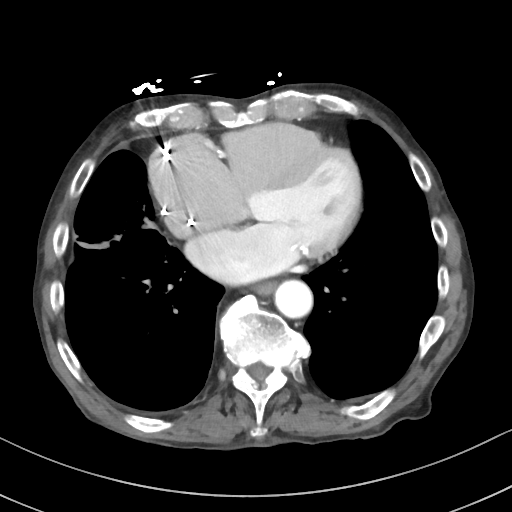

[Series 5: a/p w/ cor · coronal · 0.66mm/px · 3 of 132 slices shown]
[im 44/132  soft-tissue]
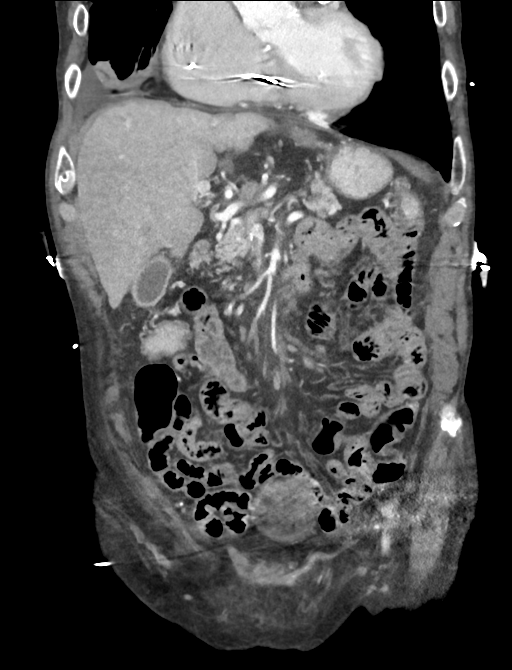
[im 59/132  soft-tissue]
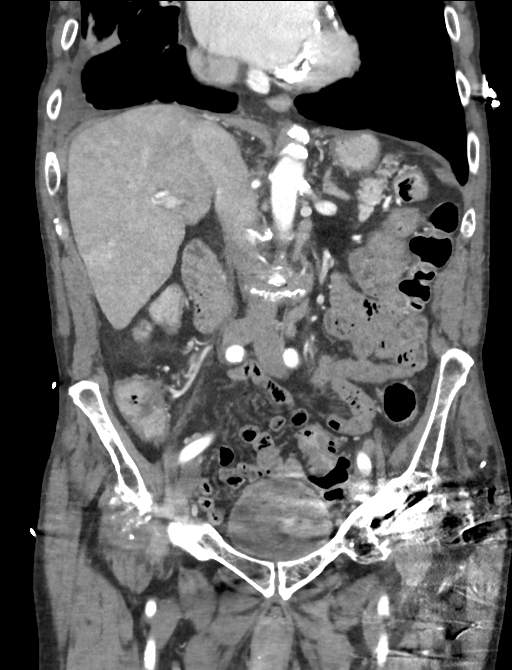
[im 73/132  soft-tissue]
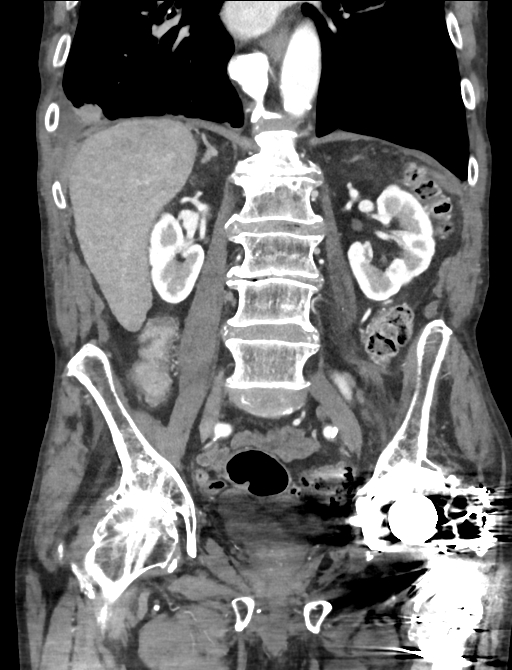

[15 of 46 positions shown; findings below may reference images not displayed]

FINDINGS: Lower chest: Consolidation inferior aspect of the right middle lobe
most consistent with atelectasis. Calcification inferior right hilum
measuring about 1 cm suggesting calcified adenopathy. Focus of
parenchymal calcification posterior lateral right lung base
measuring about 1 cm, stable when compared to prior study. Mitral
valve calcification. Coronary artery calcification. Pacer leads
noted. Small partially loculated right pleural effusions similar to
prior study. Cardiac enlargement.

Hepatobiliary: Mottled enhancement pattern in the liver with
relatively decreased enhancement peripherally seen on the portal
phase, less apparent on delayed images. This is consistent with
hepatic venous congestion likely of cardiac origin.Mildly nodular
hepatic contour suggesting cirrhosis likely also related to the
hepatic parenchymal heterogeneity.

Pancreas: Negative

Spleen: Absent

Adrenals/Urinary Tract: No masses identified. No evidence of
hydronephrosis.

Stomach/Bowel: There is significant diverticulosis of the sigmoid
colon, which is too large degree obscured by beam attenuation
artifact from left hip replacement. There is wall thickening and
narrowing of ascending and proximal transverse colon suggesting
possibility of chronic inflammation. Nonobstructive bowel gas
pattern present.

Vascular/Lymphatic: Mild to moderate atherosclerotic calcification
of the aortoiliac vessels.

Reproductive: No mass or other significant abnormality.

Other: There may be a small volume of free fluid in the pelvis as
seen to the right side posterior to the bladder on image number [id number].
Limited evaluation of this area due to beam attenuation artifact
from left femur.

Musculoskeletal: Left hip replacement causes significant beam
attenuation artifact, limiting this study. Chronic changes left
acetabulum. Severe degenerative change right hip joint.
IMPRESSION: Images of the inferior pelvis limited by beam hardening artifact
arising from left hip replacement.

Diverticulosis sigmoid colon, with no diverticulitis appreciated
allowing for limitations discussed above.

Trace ascites in the pelvis, cause uncertain. It is possible that it
may be due to fluid overload, as there is cardiac enlargement and
the appearance of the liver suggests venous congestion.
Alternatively it may be related to cirrhosis, which is suspected
based on liver contour.

Evidence to suggest chronic inflammation of the proximal half of the
colon.
